# Patient Record
Sex: Male | Born: 1935 | Race: White | Hispanic: No | Marital: Married | Smoking: Former smoker
Health system: Southern US, Community
[De-identification: ages and names within clinical notes are randomized; demographics above are authoritative.]

## PROBLEM LIST (undated history)

## (undated) DIAGNOSIS — Z860100 Personal history of colon polyps, unspecified: Secondary | ICD-10-CM

## (undated) DIAGNOSIS — I2510 Atherosclerotic heart disease of native coronary artery without angina pectoris: Secondary | ICD-10-CM

## (undated) DIAGNOSIS — F32A Depression, unspecified: Secondary | ICD-10-CM

## (undated) DIAGNOSIS — Z7901 Long term (current) use of anticoagulants: Secondary | ICD-10-CM

## (undated) DIAGNOSIS — M1990 Unspecified osteoarthritis, unspecified site: Secondary | ICD-10-CM

## (undated) DIAGNOSIS — IMO0001 Reserved for inherently not codable concepts without codable children: Secondary | ICD-10-CM

## (undated) DIAGNOSIS — I509 Heart failure, unspecified: Secondary | ICD-10-CM

## (undated) DIAGNOSIS — E785 Hyperlipidemia, unspecified: Secondary | ICD-10-CM

## (undated) DIAGNOSIS — N200 Calculus of kidney: Secondary | ICD-10-CM

## (undated) DIAGNOSIS — F329 Major depressive disorder, single episode, unspecified: Secondary | ICD-10-CM

## (undated) DIAGNOSIS — F0390 Unspecified dementia without behavioral disturbance: Secondary | ICD-10-CM

## (undated) DIAGNOSIS — I219 Acute myocardial infarction, unspecified: Secondary | ICD-10-CM

## (undated) DIAGNOSIS — I10 Essential (primary) hypertension: Secondary | ICD-10-CM

## (undated) DIAGNOSIS — I259 Chronic ischemic heart disease, unspecified: Secondary | ICD-10-CM

## (undated) DIAGNOSIS — Z87442 Personal history of urinary calculi: Secondary | ICD-10-CM

## (undated) DIAGNOSIS — I480 Paroxysmal atrial fibrillation: Secondary | ICD-10-CM

## (undated) DIAGNOSIS — I499 Cardiac arrhythmia, unspecified: Secondary | ICD-10-CM

## (undated) DIAGNOSIS — K219 Gastro-esophageal reflux disease without esophagitis: Secondary | ICD-10-CM

## (undated) DIAGNOSIS — C189 Malignant neoplasm of colon, unspecified: Secondary | ICD-10-CM

## (undated) HISTORY — DX: Personal history of colonic polyps: Z86.010

## (undated) HISTORY — PX: COLONOSCOPY: SHX174

## (undated) HISTORY — DX: Hyperlipidemia, unspecified: E78.5

## (undated) HISTORY — DX: Malignant neoplasm of colon, unspecified: C18.9

## (undated) HISTORY — DX: Unspecified osteoarthritis, unspecified site: M19.90

## (undated) HISTORY — DX: Paroxysmal atrial fibrillation: I48.0

## (undated) HISTORY — PX: EYE SURGERY: SHX253

## (undated) HISTORY — DX: Long term (current) use of anticoagulants: Z79.01

## (undated) HISTORY — DX: Calculus of kidney: N20.0

## (undated) HISTORY — DX: Chronic ischemic heart disease, unspecified: I25.9

## (undated) HISTORY — DX: Personal history of urinary calculi: Z87.442

## (undated) HISTORY — PX: COLON SURGERY: SHX602

## (undated) HISTORY — DX: Reserved for inherently not codable concepts without codable children: IMO0001

## (undated) HISTORY — DX: Essential (primary) hypertension: I10

## (undated) HISTORY — DX: Personal history of colon polyps, unspecified: Z86.0100

## (undated) HISTORY — PX: CORONARY STENT PLACEMENT: SHX1402

## (undated) HISTORY — DX: Gastro-esophageal reflux disease without esophagitis: K21.9

---

## 1988-10-20 DIAGNOSIS — C189 Malignant neoplasm of colon, unspecified: Secondary | ICD-10-CM

## 1988-10-20 HISTORY — DX: Malignant neoplasm of colon, unspecified: C18.9

## 1998-03-09 ENCOUNTER — Other Ambulatory Visit: Admission: RE | Admit: 1998-03-09 | Discharge: 1998-03-09 | Payer: Self-pay | Admitting: Cardiology

## 1999-10-04 ENCOUNTER — Encounter (INDEPENDENT_AMBULATORY_CARE_PROVIDER_SITE_OTHER): Payer: Self-pay | Admitting: Specialist

## 1999-10-04 ENCOUNTER — Other Ambulatory Visit: Admission: RE | Admit: 1999-10-04 | Discharge: 1999-10-04 | Payer: Self-pay | Admitting: Internal Medicine

## 2001-11-21 ENCOUNTER — Inpatient Hospital Stay (HOSPITAL_COMMUNITY): Admission: RE | Admit: 2001-11-21 | Discharge: 2001-11-27 | Payer: Self-pay | Admitting: Surgery

## 2001-11-21 ENCOUNTER — Encounter: Payer: Self-pay | Admitting: Surgery

## 2001-11-21 ENCOUNTER — Encounter (INDEPENDENT_AMBULATORY_CARE_PROVIDER_SITE_OTHER): Payer: Self-pay | Admitting: *Deleted

## 2001-11-21 ENCOUNTER — Encounter: Admission: RE | Admit: 2001-11-21 | Discharge: 2001-11-27 | Payer: Self-pay | Admitting: Surgery

## 2001-12-23 ENCOUNTER — Encounter: Payer: Self-pay | Admitting: Surgery

## 2001-12-23 ENCOUNTER — Ambulatory Visit (HOSPITAL_BASED_OUTPATIENT_CLINIC_OR_DEPARTMENT_OTHER): Admission: RE | Admit: 2001-12-23 | Discharge: 2001-12-23 | Payer: Self-pay | Admitting: Surgery

## 2002-04-06 ENCOUNTER — Inpatient Hospital Stay (HOSPITAL_COMMUNITY): Admission: EM | Admit: 2002-04-06 | Discharge: 2002-04-08 | Payer: Self-pay | Admitting: Emergency Medicine

## 2002-07-08 ENCOUNTER — Encounter: Admission: RE | Admit: 2002-07-08 | Discharge: 2002-07-08 | Payer: Self-pay | Admitting: Surgery

## 2002-07-08 ENCOUNTER — Encounter: Payer: Self-pay | Admitting: Surgery

## 2002-07-12 ENCOUNTER — Ambulatory Visit (HOSPITAL_BASED_OUTPATIENT_CLINIC_OR_DEPARTMENT_OTHER): Admission: RE | Admit: 2002-07-12 | Discharge: 2002-07-12 | Payer: Self-pay | Admitting: Surgery

## 2002-11-08 ENCOUNTER — Ambulatory Visit (HOSPITAL_COMMUNITY): Admission: RE | Admit: 2002-11-08 | Discharge: 2002-11-08 | Payer: Self-pay | Admitting: Oncology

## 2002-11-08 ENCOUNTER — Encounter: Payer: Self-pay | Admitting: Oncology

## 2003-02-13 ENCOUNTER — Emergency Department (HOSPITAL_COMMUNITY): Admission: EM | Admit: 2003-02-13 | Discharge: 2003-02-13 | Payer: Self-pay | Admitting: Emergency Medicine

## 2004-12-19 ENCOUNTER — Ambulatory Visit: Payer: Self-pay | Admitting: Oncology

## 2005-06-03 ENCOUNTER — Ambulatory Visit: Payer: Self-pay | Admitting: Internal Medicine

## 2005-06-25 ENCOUNTER — Ambulatory Visit: Payer: Self-pay | Admitting: Oncology

## 2005-09-04 ENCOUNTER — Ambulatory Visit: Payer: Self-pay | Admitting: Internal Medicine

## 2005-12-11 ENCOUNTER — Ambulatory Visit: Payer: Self-pay | Admitting: Internal Medicine

## 2005-12-19 ENCOUNTER — Ambulatory Visit: Payer: Self-pay | Admitting: Oncology

## 2006-01-30 ENCOUNTER — Ambulatory Visit: Payer: Self-pay | Admitting: Internal Medicine

## 2006-02-11 ENCOUNTER — Ambulatory Visit: Payer: Self-pay | Admitting: Internal Medicine

## 2006-06-11 ENCOUNTER — Ambulatory Visit: Payer: Self-pay | Admitting: Internal Medicine

## 2006-06-18 ENCOUNTER — Ambulatory Visit: Payer: Self-pay | Admitting: Oncology

## 2006-06-23 LAB — COMPREHENSIVE METABOLIC PANEL
AST: 20 U/L (ref 0–37)
BUN: 26 mg/dL — ABNORMAL HIGH (ref 6–23)
Calcium: 9.7 mg/dL (ref 8.4–10.5)
Chloride: 103 mEq/L (ref 96–112)
Creatinine, Ser: 1.05 mg/dL (ref 0.40–1.50)
Total Bilirubin: 1.1 mg/dL (ref 0.3–1.2)

## 2006-06-23 LAB — CBC WITH DIFFERENTIAL/PLATELET
BASO%: 0.4 % (ref 0.0–2.0)
Basophils Absolute: 0 10*3/uL (ref 0.0–0.1)
EOS%: 3.7 % (ref 0.0–7.0)
HCT: 43.7 % (ref 38.7–49.9)
HGB: 15.2 g/dL (ref 13.0–17.1)
LYMPH%: 17.6 % (ref 14.0–48.0)
MCH: 35 pg — ABNORMAL HIGH (ref 28.0–33.4)
MCHC: 34.9 g/dL (ref 32.0–35.9)
MCV: 100.3 fL — ABNORMAL HIGH (ref 81.6–98.0)
NEUT%: 66.1 % (ref 40.0–75.0)
Platelets: 176 10*3/uL (ref 145–400)
lymph#: 1.5 10*3/uL (ref 0.9–3.3)

## 2006-06-23 LAB — PSA: PSA: 0.91 ng/mL (ref 0.10–4.00)

## 2006-06-23 LAB — CEA: CEA: 1.6 ng/mL (ref 0.0–5.0)

## 2006-06-29 ENCOUNTER — Ambulatory Visit: Payer: Self-pay

## 2006-08-14 ENCOUNTER — Ambulatory Visit: Payer: Self-pay | Admitting: Internal Medicine

## 2006-12-09 ENCOUNTER — Ambulatory Visit: Payer: Self-pay | Admitting: Internal Medicine

## 2006-12-25 ENCOUNTER — Ambulatory Visit: Payer: Self-pay | Admitting: Oncology

## 2006-12-25 ENCOUNTER — Ambulatory Visit: Payer: Self-pay | Admitting: Internal Medicine

## 2006-12-31 LAB — CBC WITH DIFFERENTIAL/PLATELET
BASO%: 0.4 % (ref 0.0–2.0)
Basophils Absolute: 0 10*3/uL (ref 0.0–0.1)
EOS%: 3 % (ref 0.0–7.0)
HCT: 44 % (ref 38.7–49.9)
HGB: 15.6 g/dL (ref 13.0–17.1)
LYMPH%: 16.5 % (ref 14.0–48.0)
MCH: 35.3 pg — ABNORMAL HIGH (ref 28.0–33.4)
MCHC: 35.4 g/dL (ref 32.0–35.9)
MCV: 99.7 fL — ABNORMAL HIGH (ref 81.6–98.0)
MONO%: 12.1 % (ref 0.0–13.0)
NEUT%: 68 % (ref 40.0–75.0)
Platelets: 160 10*3/uL (ref 145–400)
lymph#: 1.1 10*3/uL (ref 0.9–3.3)

## 2006-12-31 LAB — COMPREHENSIVE METABOLIC PANEL
ALT: 24 U/L (ref 0–53)
AST: 22 U/L (ref 0–37)
Alkaline Phosphatase: 57 U/L (ref 39–117)
BUN: 23 mg/dL (ref 6–23)
Calcium: 9.6 mg/dL (ref 8.4–10.5)
Chloride: 104 mEq/L (ref 96–112)
Creatinine, Ser: 1.03 mg/dL (ref 0.40–1.50)
Total Bilirubin: 1.3 mg/dL — ABNORMAL HIGH (ref 0.3–1.2)

## 2006-12-31 LAB — PSA: PSA: 0.94 ng/mL (ref 0.10–4.00)

## 2007-05-27 ENCOUNTER — Ambulatory Visit: Payer: Self-pay | Admitting: Internal Medicine

## 2007-12-06 ENCOUNTER — Ambulatory Visit: Payer: Self-pay | Admitting: Internal Medicine

## 2007-12-06 DIAGNOSIS — L719 Rosacea, unspecified: Secondary | ICD-10-CM | POA: Insufficient documentation

## 2007-12-06 DIAGNOSIS — Z860100 Personal history of colon polyps, unspecified: Secondary | ICD-10-CM | POA: Insufficient documentation

## 2007-12-06 DIAGNOSIS — E785 Hyperlipidemia, unspecified: Secondary | ICD-10-CM | POA: Insufficient documentation

## 2007-12-06 DIAGNOSIS — I2510 Atherosclerotic heart disease of native coronary artery without angina pectoris: Secondary | ICD-10-CM | POA: Insufficient documentation

## 2007-12-06 DIAGNOSIS — I10 Essential (primary) hypertension: Secondary | ICD-10-CM | POA: Insufficient documentation

## 2007-12-06 DIAGNOSIS — Z85038 Personal history of other malignant neoplasm of large intestine: Secondary | ICD-10-CM | POA: Insufficient documentation

## 2007-12-06 DIAGNOSIS — M1990 Unspecified osteoarthritis, unspecified site: Secondary | ICD-10-CM | POA: Insufficient documentation

## 2007-12-31 ENCOUNTER — Ambulatory Visit: Payer: Self-pay | Admitting: Oncology

## 2008-01-04 LAB — COMPREHENSIVE METABOLIC PANEL
ALT: 20 U/L (ref 0–53)
AST: 20 U/L (ref 0–37)
Alkaline Phosphatase: 69 U/L (ref 39–117)
CO2: 26 mEq/L (ref 19–32)
Creatinine, Ser: 0.98 mg/dL (ref 0.40–1.50)
Sodium: 137 mEq/L (ref 135–145)
Total Bilirubin: 1.1 mg/dL (ref 0.3–1.2)
Total Protein: 6.9 g/dL (ref 6.0–8.3)

## 2008-01-04 LAB — CBC WITH DIFFERENTIAL/PLATELET
Basophils Absolute: 0 10*3/uL (ref 0.0–0.1)
EOS%: 3.8 % (ref 0.0–7.0)
Eosinophils Absolute: 0.3 10*3/uL (ref 0.0–0.5)
HGB: 15 g/dL (ref 13.0–17.1)
LYMPH%: 18.5 % (ref 14.0–48.0)
MCH: 35.3 pg — ABNORMAL HIGH (ref 28.0–33.4)
MCV: 100.2 fL — ABNORMAL HIGH (ref 81.6–98.0)
MONO%: 12.1 % (ref 0.0–13.0)
NEUT%: 65.3 % (ref 40.0–75.0)
Platelets: 183 10*3/uL (ref 145–400)
RDW: 13 % (ref 11.2–14.6)

## 2008-01-04 LAB — LACTATE DEHYDROGENASE: LDH: 161 U/L (ref 94–250)

## 2008-07-07 ENCOUNTER — Ambulatory Visit: Payer: Self-pay | Admitting: Internal Medicine

## 2008-08-15 ENCOUNTER — Encounter: Payer: Self-pay | Admitting: Internal Medicine

## 2008-11-13 ENCOUNTER — Encounter: Payer: Self-pay | Admitting: Internal Medicine

## 2009-01-02 ENCOUNTER — Encounter (INDEPENDENT_AMBULATORY_CARE_PROVIDER_SITE_OTHER): Payer: Self-pay | Admitting: *Deleted

## 2009-01-03 ENCOUNTER — Ambulatory Visit: Payer: Self-pay | Admitting: Oncology

## 2009-01-05 LAB — CBC WITH DIFFERENTIAL/PLATELET
Basophils Absolute: 0.1 10*3/uL (ref 0.0–0.1)
Eosinophils Absolute: 0.4 10*3/uL (ref 0.0–0.5)
HCT: 44.7 % (ref 38.4–49.9)
HGB: 15.8 g/dL (ref 13.0–17.1)
LYMPH%: 23.9 % (ref 14.0–49.0)
MCH: 34.1 pg — ABNORMAL HIGH (ref 27.2–33.4)
MCV: 96.5 fL (ref 79.3–98.0)
MONO%: 11.9 % (ref 0.0–14.0)
NEUT#: 4 10*3/uL (ref 1.5–6.5)
NEUT%: 57.8 % (ref 39.0–75.0)
Platelets: 132 10*3/uL — ABNORMAL LOW (ref 140–400)
RDW: 12.6 % (ref 11.0–14.6)

## 2009-01-05 LAB — COMPREHENSIVE METABOLIC PANEL
Albumin: 4.5 g/dL (ref 3.5–5.2)
Alkaline Phosphatase: 65 U/L (ref 39–117)
BUN: 23 mg/dL (ref 6–23)
Creatinine, Ser: 1.07 mg/dL (ref 0.40–1.50)
Glucose, Bld: 87 mg/dL (ref 70–99)
Potassium: 4.1 mEq/L (ref 3.5–5.3)

## 2009-01-05 LAB — CEA: CEA: 1 ng/mL (ref 0.0–5.0)

## 2009-01-12 LAB — CBC WITH DIFFERENTIAL/PLATELET
BASO%: 0.1 % (ref 0.0–2.0)
EOS%: 3.8 % (ref 0.0–7.0)
MCH: 35 pg — ABNORMAL HIGH (ref 27.2–33.4)
MCHC: 34.5 g/dL (ref 32.0–36.0)
MCV: 101.5 fL — ABNORMAL HIGH (ref 79.3–98.0)
MONO%: 10.6 % (ref 0.0–14.0)
NEUT%: 65.7 % (ref 39.0–75.0)
RDW: 12.5 % (ref 11.0–14.6)
lymph#: 1.5 10*3/uL (ref 0.9–3.3)

## 2009-01-12 LAB — MORPHOLOGY: PLT EST: ADEQUATE

## 2009-02-05 ENCOUNTER — Ambulatory Visit: Payer: Self-pay | Admitting: Internal Medicine

## 2009-02-27 ENCOUNTER — Ambulatory Visit: Payer: Self-pay | Admitting: Internal Medicine

## 2009-03-05 ENCOUNTER — Telehealth: Payer: Self-pay | Admitting: Internal Medicine

## 2009-03-13 ENCOUNTER — Ambulatory Visit: Payer: Self-pay | Admitting: Internal Medicine

## 2009-03-13 ENCOUNTER — Encounter: Payer: Self-pay | Admitting: Internal Medicine

## 2009-03-14 ENCOUNTER — Encounter: Payer: Self-pay | Admitting: Internal Medicine

## 2009-07-02 ENCOUNTER — Encounter: Payer: Self-pay | Admitting: Internal Medicine

## 2009-07-31 ENCOUNTER — Ambulatory Visit: Payer: Self-pay | Admitting: Internal Medicine

## 2009-08-01 LAB — CONVERTED CEMR LAB
ALT: 27 units/L (ref 0–53)
AST: 26 units/L (ref 0–37)
Albumin: 3.8 g/dL (ref 3.5–5.2)
Alkaline Phosphatase: 44 units/L (ref 39–117)
BUN: 20 mg/dL (ref 6–23)
Basophils Absolute: 0 10*3/uL (ref 0.0–0.1)
Basophils Relative: 0.5 % (ref 0.0–3.0)
Bilirubin, Direct: 0.2 mg/dL (ref 0.0–0.3)
CO2: 28 meq/L (ref 19–32)
Calcium: 9.7 mg/dL (ref 8.4–10.5)
Chloride: 106 meq/L (ref 96–112)
Cholesterol: 128 mg/dL (ref 0–200)
Creatinine, Ser: 1.1 mg/dL (ref 0.4–1.5)
Eosinophils Absolute: 0.3 10*3/uL (ref 0.0–0.7)
Eosinophils Relative: 4.6 % (ref 0.0–5.0)
GFR calc non Af Amer: 68.83 mL/min (ref >60)
Glucose, Bld: 98 mg/dL (ref 70–99)
HCT: 43.3 % (ref 39.0–52.0)
HDL: 39.8 mg/dL (ref >39.00)
Hemoglobin: 14.7 g/dL (ref 13.0–17.0)
LDL Cholesterol: 74 mg/dL (ref 0–99)
Lymphocytes Relative: 19.1 % (ref 12.0–46.0)
Lymphs Abs: 1.4 10*3/uL (ref 0.7–4.0)
MCHC: 34.1 g/dL (ref 30.0–36.0)
MCV: 104.6 fL — ABNORMAL HIGH (ref 78.0–100.0)
Monocytes Absolute: 0.8 10*3/uL (ref 0.1–1.0)
Monocytes Relative: 11.9 % (ref 3.0–12.0)
Neutro Abs: 4.6 10*3/uL (ref 1.4–7.7)
Neutrophils Relative %: 63.9 % (ref 43.0–77.0)
PSA: 0.86 ng/mL (ref 0.10–4.00)
Platelets: 137 10*3/uL — ABNORMAL LOW (ref 150.0–400.0)
Potassium: 4.6 meq/L (ref 3.5–5.1)
RBC: 4.14 M/uL — ABNORMAL LOW (ref 4.22–5.81)
RDW: 12.1 % (ref 11.5–14.6)
Sodium: 143 meq/L (ref 135–145)
TSH: 0.9 microintl units/mL (ref 0.35–5.50)
Total Bilirubin: 1.1 mg/dL (ref 0.3–1.2)
Total CHOL/HDL Ratio: 3
Total Protein: 6.8 g/dL (ref 6.0–8.3)
Triglycerides: 71 mg/dL (ref 0.0–149.0)
VLDL: 14.2 mg/dL (ref 0.0–40.0)
WBC: 7.1 10*3/uL (ref 4.5–10.5)

## 2009-08-07 ENCOUNTER — Ambulatory Visit: Payer: Self-pay | Admitting: Internal Medicine

## 2009-08-07 DIAGNOSIS — R799 Abnormal finding of blood chemistry, unspecified: Secondary | ICD-10-CM | POA: Insufficient documentation

## 2009-08-07 DIAGNOSIS — Z87891 Personal history of nicotine dependence: Secondary | ICD-10-CM | POA: Insufficient documentation

## 2009-08-07 LAB — CONVERTED CEMR LAB
Albumin ELP: 59.8 % (ref 55.8–66.1)
Alpha-1-Globulin: 4.2 % (ref 2.9–4.9)
Alpha-2-Globulin: 9.6 % (ref 7.1–11.8)
Beta Globulin: 5.5 % (ref 4.7–7.2)
Gamma Globulin: 16.5 % (ref 11.1–18.8)
Total Protein, Serum Electrophoresis: 7.1 g/dL (ref 6.0–8.3)

## 2009-08-09 LAB — CONVERTED CEMR LAB
Folate: >20 ng/mL
Sed Rate: 7 mm/hr (ref 0–22)
Vitamin B-12: 857 pg/mL (ref 211–911)

## 2009-12-17 ENCOUNTER — Telehealth: Payer: Self-pay | Admitting: Internal Medicine

## 2010-01-02 ENCOUNTER — Ambulatory Visit: Payer: Self-pay | Admitting: Oncology

## 2010-01-04 ENCOUNTER — Encounter: Payer: Self-pay | Admitting: Internal Medicine

## 2010-01-04 LAB — CBC WITH DIFFERENTIAL/PLATELET
BASO%: 0.2 % (ref 0.0–2.0)
EOS%: 3.5 % (ref 0.0–7.0)
Eosinophils Absolute: 0.3 10*3/uL (ref 0.0–0.5)
LYMPH%: 19.3 % (ref 14.0–49.0)
MCHC: 35.2 g/dL (ref 32.0–36.0)
MCV: 101.9 fL — ABNORMAL HIGH (ref 79.3–98.0)
MONO%: 12.8 % (ref 0.0–14.0)
NEUT#: 4.7 10*3/uL (ref 1.5–6.5)
Platelets: 132 10*3/uL — ABNORMAL LOW (ref 140–400)
RBC: 4.03 10*6/uL — ABNORMAL LOW (ref 4.20–5.82)
RDW: 13 % (ref 11.0–14.6)

## 2010-01-04 LAB — COMPREHENSIVE METABOLIC PANEL
ALT: 29 U/L (ref 0–53)
AST: 25 U/L (ref 0–37)
Albumin: 4.1 g/dL (ref 3.5–5.2)
Alkaline Phosphatase: 53 U/L (ref 39–117)
Glucose, Bld: 84 mg/dL (ref 70–99)
Potassium: 4.4 mEq/L (ref 3.5–5.3)
Sodium: 139 mEq/L (ref 135–145)
Total Bilirubin: 1.1 mg/dL (ref 0.3–1.2)
Total Protein: 6.7 g/dL (ref 6.0–8.3)

## 2010-01-04 LAB — LACTATE DEHYDROGENASE: LDH: 153 U/L (ref 94–250)

## 2010-01-11 ENCOUNTER — Encounter: Payer: Self-pay | Admitting: Internal Medicine

## 2010-05-27 ENCOUNTER — Ambulatory Visit: Payer: Self-pay | Admitting: Internal Medicine

## 2010-05-27 ENCOUNTER — Ambulatory Visit: Payer: Self-pay | Admitting: Cardiology

## 2010-05-30 ENCOUNTER — Encounter: Payer: Self-pay | Admitting: Internal Medicine

## 2010-11-19 NOTE — Assessment & Plan Note (Signed)
Summary: FU Samuel Santiago  #   Vital Signs:  Patient profile:   77 year old male Height:      66 inches Weight:      177 pounds BMI:     28.67 O2 Sat:      96 % on Room air Temp:     98.0 degrees F oral Pulse rate:   76 / minute Pulse rhythm:   regular Resp:     16 per minute BP sitting:   120 / 70  (left arm) Cuff size:   regular  Vitals Entered By: Lanier Prude, CMA(AAMA) (May 27, 2010 2:13 PM)  O2 Flow:  Room air CC: f/u Comments pt needsRF on Metronidazole 0.75% cream and Erythromycin 5mg /GM ointm   Primary Care Kavonte Bearse:  Tresa Garter MD  CC:  f/u.  History of Present Illness: The patient presents for a follow up of hypertension, OA, rosacea, hyperlipidemia. He had labs today w/Dr Deborah Chalk   Current Medications (verified): 1)  Crestor 10 Mg Tabs (Rosuvastatin Calcium) .Marland Kitchen.. 1 Tablet By Mouth Daily 2)  Benazepril Hcl 20 Mg  Tabs (Benazepril Hcl) .Marland Kitchen.. 1 By Mouth Daily 3)  Omeprazole 20 Mg Cpdr (Omeprazole) .... Take 1 Tabs Daily Prn 4)  Multi-Day   Tabs (Multiple Vitamin) .Marland Kitchen.. 1 Po Qd 5)  Aspirin 325 Mg Tabs (Aspirin) .... Once Daily 6)  Vitamin D3 1000 Unit  Tabs (Cholecalciferol) .Marland Kitchen.. 1 Qd 7)  Metronidazole 0.75 %  Crea (Metronidazole) .... Use Two Times A Day On Face  Allergies (verified): No Known Drug Allergies  Past History:  Past Medical History: Last updated: 08/07/2009 Coronary artery disease Dr Deborah Chalk Hyperlipidemia Hypertension Osteoarthritis Rosecea Colon cancer, hx of  Dr Cyndie Chime  Dr Francene Boyers  Past Surgical History: Last updated: 08/07/2009 Colectomy  Family History: Last updated: 12/06/2007 Family History Hypertension  Social History: Last updated: 12/06/2007 Retired Married Former Smoker  Review of Systems  The patient denies fever, chest pain, syncope, and dyspnea on exertion.    Physical Exam  General:  pleasent and cooperative, in no distress Eyes:  Lower eylids inflamed B Ears:  External ear exam shows  no significant lesions or deformities.  Otoscopic examination reveals clear canals, tympanic membranes are intact bilaterally without bulging, retraction, inflammation or discharge. Hearing is grossly normal bilaterally. Mouth:  Oral mucosa and oropharynx without lesions or exudates.  Teeth in good repair. Neck:  supple, no lymphodenopathy, no thyromegaly, no masses Lungs:  clear bilaterally, no wheezes, rhonchi or crackles Heart:  RRR, no murmurs, rubs or gallops Abdomen:  soft and non-tender with normal BS, no organomegalies, no masses Colostomy preseent Msk:  No deformity or scoliosis noted of thoracic or lumbar spine.   Extremities:  no edema Neurologic:  A little ataxic Skin:  Intact without suspicious lesions or rashes Psych:  Cognition and judgment appear intact. Alert and cooperative with normal attention span and concentration. No apparent delusions, illusions, hallucinations   Impression & Recommendations:  Problem # 1:  ROSACEA (ICD-695.3) Assessment Unchanged On the regimen of medicine(s) reflected in the chart    Problem # 2:  HYPERLIPIDEMIA (ICD-272.4) Assessment: Unchanged  His updated medication list for this problem includes:    Crestor 10 Mg Tabs (Rosuvastatin calcium) .Marland Kitchen... 1 tablet by mouth daily  Problem # 3:  HYPERTENSION (ICD-401.9) Assessment: Unchanged  His updated medication list for this problem includes:    Benazepril Hcl 20 Mg Tabs (Benazepril hcl) .Marland Kitchen... 1 by mouth daily  Problem # 4:  CORONARY  ARTERY DISEASE (ICD-414.00) Assessment: Unchanged  His updated medication list for this problem includes:    Benazepril Hcl 20 Mg Tabs (Benazepril hcl) .Marland Kitchen... 1 by mouth daily    Aspirin 325 Mg Tabs (Aspirin) ..... Once daily  Complete Medication List: 1)  Crestor 10 Mg Tabs (Rosuvastatin calcium) .Marland Kitchen.. 1 tablet by mouth daily 2)  Benazepril Hcl 20 Mg Tabs (Benazepril hcl) .Marland Kitchen.. 1 by mouth daily 3)  Omeprazole 20 Mg Cpdr (Omeprazole) .... Take 1 tabs daily  prn 4)  Multi-day Tabs (Multiple vitamin) .Marland Kitchen.. 1 po qd 5)  Aspirin 325 Mg Tabs (Aspirin) .... Once daily 6)  Vitamin D3 1000 Unit Tabs (Cholecalciferol) .Marland Kitchen.. 1 qd 7)  Metronidazole 0.75 % Crea (Metronidazole) .... Use two times a day on face 8)  Erythromycin 5 Mg/gm Oint (Erythromycin) .... In affected eye(s)  two times a day  Patient Instructions: 1)  Please schedule a follow-up appointment in 6 months well w/labs. 2)  Use balance  exercises that I have provided (5 min. every day)  Prescriptions: METRONIDAZOLE 0.75 %  CREA (METRONIDAZOLE) use two times a day on face  #45 g x 3   Entered and Authorized by:   Tresa Garter MD   Signed by:   Tresa Garter MD on 05/27/2010   Method used:   Electronically to        [facility]* (retail)       [address] [place], [place]  [address]       Ph: [phone] or [phone]       Fax: [phone]   RxID:   [id number] ERYTHROMYCIN 5 MG/GM OINT (ERYTHROMYCIN) in affected eye(s)  two times a day  #15 g x 4   Entered and Authorized by:   Tresa Garter MD   Signed by:   Tresa Garter MD on 05/27/2010   Method used:   Electronically to        [facility]* (retail)       [address] [place], [place]  [address]       Ph: [phone] or [phone]       Fax: [phone]   RxID:   [id number]

## 2010-11-19 NOTE — Progress Notes (Signed)
Summary: Omeprazole refill  Phone Note Refill Request Message from:  Fax from Pharmacy on December 17, 2009 11:39 AM  Refills Requested: Medication #1:  OMEPRAZOLE 20 MG CPDR Take 1 tabs daily prn Initial call taken by: Lucious Groves,  December 17, 2009 11:39 AM    Prescriptions: OMEPRAZOLE 20 MG CPDR (OMEPRAZOLE) Take 1 tabs daily prn  #30 x 11   Entered by:   Lucious Groves   Authorized by:   Tresa Garter MD   Signed by:   Lucious Groves on 12/17/2009   Method used:   Electronically to        [facility]* (retail)       [address] [place], [place]  [address]       Ph: [phone] or [phone]       Fax: [phone]   RxID:   [id number]

## 2010-11-19 NOTE — Letter (Signed)
[facility]   Imported By: Lennie Odor 01/29/2010 14:37:25  _____________________________________________________________________  External Attachment:    Type:   Image     Comment:   External Document

## 2010-11-20 DIAGNOSIS — IMO0001 Reserved for inherently not codable concepts without codable children: Secondary | ICD-10-CM

## 2010-11-20 HISTORY — DX: Reserved for inherently not codable concepts without codable children: IMO0001

## 2010-11-26 ENCOUNTER — Other Ambulatory Visit: Payer: Self-pay

## 2010-11-28 ENCOUNTER — Encounter (INDEPENDENT_AMBULATORY_CARE_PROVIDER_SITE_OTHER): Payer: Self-pay | Admitting: *Deleted

## 2010-11-28 ENCOUNTER — Other Ambulatory Visit: Payer: MEDICARE

## 2010-11-28 ENCOUNTER — Other Ambulatory Visit: Payer: Self-pay | Admitting: Internal Medicine

## 2010-11-28 DIAGNOSIS — I10 Essential (primary) hypertension: Secondary | ICD-10-CM

## 2010-11-28 DIAGNOSIS — E785 Hyperlipidemia, unspecified: Secondary | ICD-10-CM

## 2010-11-28 DIAGNOSIS — Z125 Encounter for screening for malignant neoplasm of prostate: Secondary | ICD-10-CM

## 2010-11-28 DIAGNOSIS — Z0000 Encounter for general adult medical examination without abnormal findings: Secondary | ICD-10-CM

## 2010-11-28 LAB — BASIC METABOLIC PANEL
BUN: 22 mg/dL (ref 6–23)
Calcium: 9.2 mg/dL (ref 8.4–10.5)
Creatinine, Ser: 1 mg/dL (ref 0.4–1.5)
GFR: 74 mL/min (ref >60.00)
Glucose, Bld: 83 mg/dL (ref 70–99)
Sodium: 136 mEq/L (ref 135–145)

## 2010-11-28 LAB — LIPID PANEL
Cholesterol: 135 mg/dL (ref 0–200)
HDL: 36.9 mg/dL — ABNORMAL LOW (ref >39.00)
Triglycerides: 79 mg/dL (ref 0.0–149.0)
VLDL: 15.8 mg/dL (ref 0.0–40.0)

## 2010-11-28 LAB — HEPATIC FUNCTION PANEL
AST: 25 U/L (ref 0–37)
Alkaline Phosphatase: 59 U/L (ref 39–117)
Total Bilirubin: 1 mg/dL (ref 0.3–1.2)

## 2010-11-28 LAB — URINALYSIS, ROUTINE W REFLEX MICROSCOPIC
Nitrite: NEGATIVE
Specific Gravity, Urine: 1.02 (ref 1.000–1.030)
Total Protein, Urine: NEGATIVE
Urine Glucose: NEGATIVE
Urobilinogen, UA: 0.2 (ref 0.0–1.0)

## 2010-11-28 LAB — CBC WITH DIFFERENTIAL/PLATELET
Basophils Absolute: 0 10*3/uL (ref 0.0–0.1)
Eosinophils Absolute: 0.3 10*3/uL (ref 0.0–0.7)
Lymphocytes Relative: 12.5 % (ref 12.0–46.0)
Monocytes Relative: 10.3 % (ref 3.0–12.0)
Neutrophils Relative %: 73.5 % (ref 43.0–77.0)
Platelets: 192 10*3/uL (ref 150.0–400.0)
RDW: 12.6 % (ref 11.5–14.6)

## 2010-11-28 LAB — TSH: TSH: 0.74 u[IU]/mL (ref 0.35–5.50)

## 2010-12-02 ENCOUNTER — Ambulatory Visit (INDEPENDENT_AMBULATORY_CARE_PROVIDER_SITE_OTHER): Payer: MEDICARE | Admitting: Internal Medicine

## 2010-12-02 ENCOUNTER — Encounter: Payer: Self-pay | Admitting: Internal Medicine

## 2010-12-02 DIAGNOSIS — R799 Abnormal finding of blood chemistry, unspecified: Secondary | ICD-10-CM

## 2010-12-02 DIAGNOSIS — H01009 Unspecified blepharitis unspecified eye, unspecified eyelid: Secondary | ICD-10-CM | POA: Insufficient documentation

## 2010-12-02 DIAGNOSIS — N3090 Cystitis, unspecified without hematuria: Secondary | ICD-10-CM | POA: Insufficient documentation

## 2010-12-02 DIAGNOSIS — Z0000 Encounter for general adult medical examination without abnormal findings: Secondary | ICD-10-CM

## 2010-12-10 ENCOUNTER — Ambulatory Visit (INDEPENDENT_AMBULATORY_CARE_PROVIDER_SITE_OTHER): Payer: MEDICARE | Admitting: Cardiology

## 2010-12-10 DIAGNOSIS — I2510 Atherosclerotic heart disease of native coronary artery without angina pectoris: Secondary | ICD-10-CM

## 2010-12-10 DIAGNOSIS — I10 Essential (primary) hypertension: Secondary | ICD-10-CM

## 2010-12-10 DIAGNOSIS — E7800 Pure hypercholesterolemia, unspecified: Secondary | ICD-10-CM

## 2010-12-11 NOTE — Assessment & Plan Note (Signed)
Summary: 6 MOS F/U W/LABS (PT DOES NOT WANT WELL) CD   Vital Signs:  Patient profile:   77 year old male Height:      66 inches Weight:      177 pounds BMI:     28.67 O2 Sat:      94 % on Room air Temp:     97.8 degrees F oral Pulse rate:   66 / minute Pulse rhythm:   regular BP sitting:   146 / 82  (left arm) Cuff size:   large  Vitals Entered By: Rock Nephew CMA (December 02, 2010 9:07 AM)  O2 Flow:  Room air CC: follow-up visit Is Patient Diabetic? No Pain Assessment Patient in pain? no       Does patient need assistance? Functional Status Self care Ambulation Normal   Primary Care Provider:  Georgina Quint Stephana Morell MD  CC:  follow-up visit.  History of Present Illness: The patient presents for a follow up of hypertension, OA, GERD, hyperlipidemia  Patient past medical history, social history, and family history reviewed in detail no significant changes.  Patient is physically active. Depression is negative and mood is good. Hearing is normal, and able to perform activities of daily living. Risk of falling is negligible and home safety has been reviewed and is appropriate. Patient has normal height, he is overweight, and visual acuity is ok w/glasses. Patient has been counseled on age-appropriate routine health concerns for screening and prevention. Education, counseling done. Cognition is nl.  Preventive Screening-Counseling & Management  Alcohol-Tobacco     Alcohol drinks/day: 0     Smoking Status: quit > 6 months  Caffeine-Diet-Exercise     Caffeine Counseling: not indicated; caffeine use is not excessive or problematic     Diet Counseling: not indicated; diet is assessed to be healthy     Nutrition Referrals: no     Does Patient Exercise: yes     Type of exercise: walking     Exercise (avg: min/session): 30-60     Times/week: 7     Exercise Counseling: not indicated; exercise is adequate     Depression Counseling: not indicated; screening negative for  depression  Hep-HIV-STD-Contraception     Hepatitis Risk: no risk noted     Sun Exposure-Excessive: no  Safety-Violence-Falls     Seat Belt Use: yes     Violence in the Home: no risk noted     Sexual Abuse: no     Fall Risk Counseling: not indicated; no significant falls noted      Sexual History:  currently monogamous.    Allergies: No Known Drug Allergies  Past History:  Past Medical History: Last updated: 08/07/2009 Coronary artery disease Dr Deborah Chalk Hyperlipidemia Hypertension Osteoarthritis Rosecea Colon cancer, hx of  Dr Cyndie Chime  Dr Francene Boyers  Past Surgical History: Last updated: 08/07/2009 Colectomy  Family History: Last updated: 12/06/2007 Family History Hypertension  Social History: Last updated: 12/06/2007 Retired Married Former Smoker  Social History: Smoking Status:  quit > 6 months Does Patient Exercise:  yes Hepatitis Risk:  no risk noted Sun Exposure-Excessive:  no Seat Belt Use:  yes Sexual History:  currently monogamous  Review of Systems  The patient denies fever, dyspnea on exertion, abdominal pain, difficulty walking, depression, anorexia, weight loss, weight gain, vision loss, decreased hearing, hoarseness, chest pain, syncope, peripheral edema, prolonged cough, headaches, hemoptysis, melena, hematochezia, severe indigestion/heartburn, hematuria, incontinence, genital sores, muscle weakness, suspicious skin lesions, transient blindness, unusual weight change, abnormal bleeding, enlarged  lymph nodes, angioedema, and breast masses.         OA  Physical Exam  General:  pleasent and cooperative, in no distress Head:  Normocephalic and atraumatic without obvious abnormalities. No apparent alopecia or balding. Eyes:  Lower eylids inflamed B Ears:  External ear exam shows no significant lesions or deformities.  Otoscopic examination reveals clear canals, tympanic membranes are intact bilaterally without bulging, retraction,  inflammation or discharge. Hearing is grossly normal bilaterally. Mouth:  Oral mucosa and oropharynx without lesions or exudates.  Teeth in good repair. Neck:  supple, no lymphodenopathy, no thyromegaly, no masses Lungs:  clear bilaterally, no wheezes, rhonchi or crackles Heart:  RRR, no murmurs, rubs or gallops Abdomen:  soft and non-tender with normal BS, no organomegalies, no masses Colostomy preseent Msk:  No deformity or scoliosis noted of thoracic or lumbar spine.   Extremities:  No edema B Neurologic:  A little ataxic Skin:  Intact without suspicious lesions or rashes Psych:  Cognition and judgment appear intact. Alert and cooperative with normal attention span and concentration. No apparent delusions, illusions, hallucinations   Impression & Recommendations:  Problem # 1:  WELL ADULT EXAM (ICD-V70.0) Assessment New  Overall doing well, age appropriate education and counseling updated and referral for appropriate preventive services done unless declined, immunizations up to date or declined, diet counseling done if overweight, urged to quit smoking if smokes, most recent labs reviewed and current ordered if appropriate, ecg reviewed or declined (interpretation per ECG scanned in the EMR if done); information regarding Medicare Preventation requirements given if appropriate.  The labs were reviewed with the patient.   Orders: Medicare -1st Annual Wellness Visit [phone])  Problem # 2:  CYSTITIS (ICD-595.9) Assessment: New  His updated medication list for this problem includes:    Ciprofloxacin Hcl 250 Mg Tabs (Ciprofloxacin hcl) .Marland Kitchen... 1 by mouth two times a day for cystitis  Problem # 3:  CBC, ABNORMAL (ICD-790.99) elev MCV Assessment: Deteriorated Chronic - watching - repeat labs. See "Patient Instructions".   Problem # 4:  BLEPHARITIS (ICD-373.00) Assessment: Deteriorated On the regimen of medicine(s) reflected in the chart    Complete Medication List: 1)  Crestor 10 Mg  Tabs (Rosuvastatin calcium) .Marland Kitchen.. 1 tablet by mouth daily 2)  Benazepril Hcl 20 Mg Tabs (Benazepril hcl) .Marland Kitchen.. 1 by mouth daily 3)  Omeprazole 20 Mg Cpdr (Omeprazole) .... Take 1 tabs daily prn 4)  Multi-day Tabs (Multiple vitamin) .Marland Kitchen.. 1 po qd 5)  Aspirin 325 Mg Tabs (Aspirin) .... Once daily 6)  Vitamin D3 1000 Unit Tabs (Cholecalciferol) .Marland Kitchen.. 1 qd 7)  Metronidazole 0.75 % Crea (Metronidazole) .... Use two times a day on face 8)  Ciprofloxacin Hcl 250 Mg Tabs (Ciprofloxacin hcl) .Marland Kitchen.. 1 by mouth two times a day for cystitis 9)  Erythromycin 5 Mg/gm Oint (Erythromycin) .... In affected eye(s)  two times a day  Patient Instructions: 1)  Please schedule a follow-up appointment in 6 months. 2)  BMP prior to visit, ICD-9: 3)  Hepatic Panel prior to visit, ICD-9: 4)  Lipid Panel prior to visit, ICD-9: 5)  CBC w/ Diff prior to visit, ICD-9: 6)  Urine-dip prior to visit, ICD-9: 7)  Vit B12 8)  SPEP 790.99  595.0 Prescriptions: ERYTHROMYCIN 5 MG/GM OINT (ERYTHROMYCIN) in affected eye(s)  two times a day  #1 tube x 3   Entered and Authorized by:   Tresa Garter MD   Signed by:   Tresa Garter MD on 12/02/2010  Method used:   Electronically to        [facility]* (retail)       [address] [place], [place]  [address]       Ph: [phone] or [phone]       Fax: [phone]   RxID:   [id number] CIPROFLOXACIN HCL 250 MG TABS (CIPROFLOXACIN HCL) 1 by mouth two times a day for cystitis  #20 x 0   Entered and Authorized by:   Tresa Garter MD   Signed by:   Tresa Garter MD on 12/02/2010   Method used:   Electronically to        [facility]* (retail)       [address] [place], [place]  [address]       Ph: [phone] or [phone]       Fax: [phone]   RxID:   [id number]    Orders Added: 1)  Medicare -1st Annual  Wellness Visit [G0438] 2)  Est. Patient Level III [627[address]]   Immunization History:  Influenza Immunization History:    Influenza:  historical (07/24/2010)  Tetanus Vaccine (to be given today)   Immunization History:  Influenza Immunization History:    Influenza:  Historical (07/24/2010)  Tetanus Vaccine (to be given today)

## 2010-12-13 ENCOUNTER — Telehealth (INDEPENDENT_AMBULATORY_CARE_PROVIDER_SITE_OTHER): Payer: Self-pay | Admitting: *Deleted

## 2010-12-16 ENCOUNTER — Encounter: Payer: Self-pay | Admitting: Internal Medicine

## 2010-12-16 ENCOUNTER — Ambulatory Visit (HOSPITAL_COMMUNITY): Payer: Medicare Other | Attending: Cardiology

## 2010-12-16 DIAGNOSIS — R079 Chest pain, unspecified: Secondary | ICD-10-CM | POA: Insufficient documentation

## 2010-12-16 DIAGNOSIS — I2510 Atherosclerotic heart disease of native coronary artery without angina pectoris: Secondary | ICD-10-CM

## 2010-12-17 NOTE — Progress Notes (Signed)
Summary: Nuclear Pre-Procedure  Phone Note Outgoing Call Call back at [facility] Phone [phone]   Call placed by: Stanton Kidney, EMT-P,  December 13, 2010 1:17 PM Summary of Call: Unable to leave message with information on Myoview Information Sheet (see scanned document for details); no answer/no machine. Stanton Kidney, EMT-P  December 13, 2010 1:17 PM      Nuclear Med Background Indications for Stress Test: Evaluation for Ischemia, Stent Patency   History: Heart Catheterization, History of Chemo, Myocardial Infarction, Myocardial Perfusion Study, Stents  History Comments: '97 MI > RCA stent '03 MI > RCA re-do '10 MPS: EF=56%, Inf. fixed defect     Nuclear Pre-Procedure Cardiac Risk Factors: Family History - CAD, History of Smoking, Hypertension, Lipids Height (in): 66

## 2010-12-26 NOTE — Assessment & Plan Note (Signed)
Summary: LEXSCAN/ CHEST PAINS/INS:SECURE HOR/WT:176/ WJ:[id number] PER KE...  Nuclear Med Background Indications for Stress Test: Evaluation for Ischemia, Stent Patency   History: Heart Catheterization, History of Chemo, Myocardial Infarction, Myocardial Perfusion Study, Stents  History Comments: '97 MI > RCA stent '03 MI > RCA re-do '10 MPS: EF=56%, Inf. fixed defect  Symptoms: Palpitations    Nuclear Pre-Procedure Cardiac Risk Factors: Family History - CAD, History of Smoking, Hypertension, Lipids Caffeine/Decaff Intake: None NPO After: 6:00 AM Lungs: clear IV 0.9% NS with Angio Cath: 22g     IV Site: L Antecubital IV Started by: Irean Hong, RN Chest Size (in) 44     Height (in): 66 Weight (lb): 174 BMI: 28.19  Nuclear Med Study 1 or 2 day study:  1 day     Stress Test Type:  Treadmill/Lexiscan Reading MD:  Dietrich Pates, MD     Referring MD:  S.Tennant Resting Radionuclide:  Technetium 21m Tetrofosmin     Resting Radionuclide Dose:  11 mCi  Stress Radionuclide:  Technetium 78m Tetrofosmin     Stress Radionuclide Dose:  33 mCi   Stress Protocol  Max Systolic BP: 172 mm Hg Lexiscan: 0.4 mg   Stress Test Technologist:  Milana Na, EMT-P     Nuclear Technologist:  Doyne Keel, CNMT  Rest Procedure  Myocardial perfusion imaging was performed at rest 45 minutes following the intravenous administration of Technetium 45m Tetrofosmin.  Stress Procedure  The patient received IV Lexiscan 0.4 mg over 15-seconds with concurrent low level exercise and then Technetium 31m Tetrofosmin was injected at 30-seconds while the patient continued walking one more minute.  There were no significant changes and rare pvcs with Lexiscan.  Quantitative spect images were obtained after a 45 minute delay.  QPS Raw Data Images:  Soft tissue (dipahragm, subcutaneous fat) surround heart. Stress Images:  Decreased counts in the inferior wall (base, minimal mid).  Minimal apical thinning. Rest  Images:  No significant change from the stress images. Subtraction (SDS):  No evidence of ischemia. Transient Ischemic Dilatation:  1.06  (Normal <1.22)  Lung/Heart Ratio:  0.31  (Normal <0.45)  Quantitative Gated Spect Images QGS EDV:  90 ml QGS ESV:  36 ml QGS EF:  60 % QGS cine images:  Hypokinesis in the basal inferior wall.   Overall Impression  Exercise Capacity: Lexiscan with low level exercise BP Response: Normal blood pressure response. Clinical Symptoms: Weak in chest ECG Impression: No significant ST segment change suggestive of ischemia. Overall Impression: Basal inferior scar, cannot exclude coexistent soft tissue attenuation.  No significant ischemia.

## 2011-01-03 ENCOUNTER — Other Ambulatory Visit: Payer: Self-pay | Admitting: Oncology

## 2011-01-03 ENCOUNTER — Encounter (HOSPITAL_BASED_OUTPATIENT_CLINIC_OR_DEPARTMENT_OTHER): Payer: Medicare Other | Admitting: Oncology

## 2011-01-03 DIAGNOSIS — Z85038 Personal history of other malignant neoplasm of large intestine: Secondary | ICD-10-CM

## 2011-01-03 DIAGNOSIS — Z85048 Personal history of other malignant neoplasm of rectum, rectosigmoid junction, and anus: Secondary | ICD-10-CM

## 2011-01-03 DIAGNOSIS — C20 Malignant neoplasm of rectum: Secondary | ICD-10-CM

## 2011-01-03 LAB — CBC WITH DIFFERENTIAL/PLATELET
BASO%: 0.4 % (ref 0.0–2.0)
EOS%: 3.8 % (ref 0.0–7.0)
Eosinophils Absolute: 0.3 10*3/uL (ref 0.0–0.5)
LYMPH%: 17.2 % (ref 14.0–49.0)
MCHC: 34.6 g/dL (ref 32.0–36.0)
MCV: 101.5 fL — ABNORMAL HIGH (ref 79.3–98.0)
MONO%: 9 % (ref 0.0–14.0)
NEUT#: 5.5 10*3/uL (ref 1.5–6.5)
RBC: 4.07 10*6/uL — ABNORMAL LOW (ref 4.20–5.82)
RDW: 12.7 % (ref 11.0–14.6)

## 2011-01-03 LAB — COMPREHENSIVE METABOLIC PANEL
ALT: 25 U/L (ref 0–53)
AST: 24 U/L (ref 0–37)
Albumin: 4 g/dL (ref 3.5–5.2)
Alkaline Phosphatase: 63 U/L (ref 39–117)
Potassium: 4.3 mEq/L (ref 3.5–5.3)
Sodium: 141 mEq/L (ref 135–145)
Total Bilirubin: 1 mg/dL (ref 0.3–1.2)
Total Protein: 6.5 g/dL (ref 6.0–8.3)

## 2011-01-17 ENCOUNTER — Encounter (HOSPITAL_BASED_OUTPATIENT_CLINIC_OR_DEPARTMENT_OTHER): Payer: Medicare Other | Admitting: Oncology

## 2011-01-17 DIAGNOSIS — Z09 Encounter for follow-up examination after completed treatment for conditions other than malignant neoplasm: Secondary | ICD-10-CM

## 2011-01-17 DIAGNOSIS — Z85038 Personal history of other malignant neoplasm of large intestine: Secondary | ICD-10-CM

## 2011-01-17 DIAGNOSIS — Z85048 Personal history of other malignant neoplasm of rectum, rectosigmoid junction, and anus: Secondary | ICD-10-CM

## 2011-02-07 ENCOUNTER — Encounter: Payer: Self-pay | Admitting: Cardiology

## 2011-03-07 NOTE — H&P (Signed)
[facility]  Patient:    TORREZ, RENFROE Visit Number: [id number] MRN: [id number]          Service Type: MED Location: [phone] Attending Physician:  Armanda Magic Dictated by:   Armanda Magic, M.D. Admit Date:  04/06/2002   CC:         Colleen Can. Deborah Chalk, M.D.   History and Physical  DATE OF BIRTH:  2028-11-02  CHIEF COMPLAINT:  Chest pain.  HISTORY OF PRESENT ILLNESS:  This is a 77 year old white male who presented to the emergency room with chest pain.  He said yesterday that he felt weak all day and did not feel himself.  Last evening around 9 p.m., he developed substernal chest pain and shoulder pain very similar to what he had with his previous MI while at rest, lasting 10 minutes and resolving spontaneously. There was no associated shortness of breath, nausea, vomiting, or diaphoresis. The pain recurred about 1:30 a.m. and he presented to the hospital.  He said that the pain lasted about 45 minutes, but resolved on its own after taking an aspirin.  He did not take any nitroglycerin.  He currently is pain-free.  PAST MEDICAL HISTORY:  Significant for myocardial infarction in 1995.  He is status post PTCA and stenting of what sounds like the left circumflex.  There are no records available at this time.  Exercise Cardiolite study several weeks ago done in Dr. Colleen Can. Tennants office reportedly was normal.  He has a history of colon cancer x 2, currently on chemotherapy.  He has had a colectomy x 2.  He has a history of hypertension, diabetes mellitus, and hyperlipidemia.  ALLERGIES:  None.  SOCIAL HISTORY:  He is married.  He denies tobacco or alcohol use.  FAMILY HISTORY:  Noncontributory.  MEDICATIONS: 1. Hyzaar 100/25 mg q.d. 2. Lipitor 10 mg q.d. 3. Aspirin 325 mg q.d. 4. Multivitamins.  REVIEW OF SYSTEMS:  Negative other than what is stated in the HPI.  PHYSICAL EXAMINATION:  The blood pressure on admission was 181/100  and heart rate 74.  GENERAL APPEARANCE:  This is a well-developed, well-nourished, white male in no acute distress.  HEENT:  Benign.  NECK:  Supple without lymphadenopathy.  No bruits.  Carotid upstrokes +2 bilaterally.  LUNGS:  Clear to auscultation throughout.  HEART:  Regular rate and rhythm.  No murmurs, rubs, or gallops.  Normal S1 and S2.  ABDOMEN:  Soft, nontender, and nondistended with active bowel sounds.  No hepatosplenomegaly.  EXTREMITIES:  No cyanosis, erythema, or edema.  Good distal pulses.  Dorsalis pedis pulses +2 bilaterally.  LABORATORY DATA:  CMET is pending.  White cell count 4.1, hematocrit 38.3, hemoglobin 13.1, platelet count 233.  CPK 82, MB 1.4, troponin 0.1.  The EKG shows normal sinus rhythm with nonspecific ST abnormality.  ASSESSMENT: 1. Chest pain with a history of coronary disease and myocardial infarction.    Recent stress Cardiolite study with no ischemia.  Cardiac enzymes are    borderline with negative CPK and positive troponin.  He is currently    pain-free. 2. Hyperlipidemia. 3. Hypertension. 4. Colon cancer on chemotherapy.  PLAN: 1. Admit to the chest pain unit. 2. Rule out myocardial infarction with serial enzymes. 3. Subcu Lovenox. 4. Lopressor 25 mg b.i.d. 5. Aspirin. 6. Transfer to [facility] for further work-up per Colleen Can. Deborah Chalk, M.D. Dictated by:   Armanda Magic, M.D. Attending Physician:  Armanda Magic  DD:  04/06/02 TD:  04/07/02 Job: [id number]

## 2011-03-07 NOTE — Cardiovascular Report (Signed)
[place]  Patient:    Samuel Santiago, Samuel Santiago Visit Number: [id number] MRN: [id number]          Service Type: MED Location: [phone] Attending Physician:  Eleanora Neighbor Dictated by:   Peter M. [place], M.D. Proc. Date: 04/07/02 Admit Date:  04/06/2002 Discharge Date: 04/08/2002                          Cardiac Catheterization  NO DICTATION Dictated by:   Peter M. [place], M.D. Attending Physician:  Eleanora Neighbor DD:  04/07/02 TD:  04/09/02 Job: [id number]

## 2011-03-07 NOTE — Op Note (Signed)
   NAME:  Samuel Santiago, Samuel Santiago                        ACCOUNT NO.:  [id number]   MEDICAL RECORD NO.:  [id number]                   PATIENT TYPE:  AMB   LOCATION:  [facility]SC                                  FACILITY:  [facility]   PHYSICIAN:  Thornton Park. Daphine Deutscher, M.D.             DATE OF BIRTH:  [birth year]   DATE OF PROCEDURE:  07/12/2002  DATE OF DISCHARGE:                                 OPERATIVE REPORT   PREOPERATIVE DIAGNOSIS:  History of colon cancer with Port-A-Cath.   POSTOPERATIVE DIAGNOSIS:  History of colon cancer with Port-A-Cath.   PROCEDURE:  Explantation of Port-A-Cath.   SURGEON:  Thornton Park. Daphine Deutscher, M.D.   ANESTHESIA:  MAC.   DESCRIPTION OF PROCEDURE:  The patient was taken to room 3 and given MAC  anesthesia.  The left chest was prepped with Betadine and draped sterilely.  The old incision site where the Port-A-Cath was was injected with a mixture  of Marcaine and lidocaine.  The old scar was excised, and the Port-A-Cath  was explained in toto.  The Prolene sutures holding it in were removed.  The  explantation site was dry.  It was then closed with 4-0 Vicryl  subcutaneously and subcuticularly with 5-0 Vicryl with Benzoin and Steri-  Strips on the skin.  The patient tolerated the procedure well.  He will take  some Vicodin if needed at home that he has and will follow up with me as  needed.                                               Thornton Park Daphine Deutscher, M.D.    MBM/MEDQ  D:  07/12/2002  T:  07/13/2002  Job:  [id number]   cc:   Medical Oncology

## 2011-03-07 NOTE — Assessment & Plan Note (Signed)
[facility]                           PRIMARY CARE OFFICE NOTE   BERNIS, SCHREUR                     MRN:          [id number]  DATE:12/25/2006                            DOB:          Mar 28, 1929    PROCEDURE:  Cryosurgery.   INDICATIONS FOR PROCEDURE:  Actinic keratosis on the face.   The risks and benefits explained to the patient in detail, he agreed to  proceed. A total of 8 lesions on the face including ears and nose were  treated with liquid nitrogen in the usual fashion, tolerated well,  complications none.     Georgina Quint. Plotnikov, MD  Electronically Signed    AVP/MedQ  DD: 12/25/2006  DT: 12/25/2006  Job #: [id number]

## 2011-03-07 NOTE — Discharge Summary (Signed)
[name]  Patient:    Samuel Santiago, Samuel Santiago Visit Number: [id number] MRN: [id number]          Service Type: MED Location: [id number] Attending Physician:  Eleanora Neighbor Dictated by:   Peter M. Sw[place]il[place], M.D. Admit Date:  04/06/2002 Discharge Date: 04/08/2002   CC:         Colleen Can. Deborah Chalk, M.D.  Genene Churn. Cyndie Chime, M.D.   Discharge Summary  HISTORY OF PRESENT ILLNESS:  The patient is a 77 year old male with a history of coronary artery disease, status post inferior myocardial infarction in 1995, treated with stenting of the right coronary artery. Subsequent followup cardiac catheterization in 1997 showed continued patency of the stent. He has done well since then until he presented on this occasion with substernal chest pain at rest, associated with shoulder pain. His pain did resolve with sublingual nitroglycerin. He presented to the emergency room.  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 He is currently undergoing a course of chemotherapy for one positive lymph node. He has a history of hypertension, diabetes and hyperlipidemia. For details of his past medical history, social history, family history and physical examination, please see admission history and physical.  LABORATORY DATA:  ECG showed normal sinus rhythm with evidence of old inferior infarction. There were no acute changes.  White count 4200, hemoglobin 12.2, hematocrit 35.4, platelets 233,000. Creatinine 1.0, otherwise ISTAT was unremarkable.  HOSPITAL COURSE:  The patient was admitted to telemetry. He was treated with subcu Lovenox and started on a beta blocker. He had no recurrent chest pain. Subsequent troponins were abnormal at 0.10, followed by 0.26 and then 0.24. His CPKs were all negative. The ECG showed no acute changes. The patient subsequently underwent cardiac catheterization on April 07, 2002. This demonstrated  single vessel obstructive coronary artery disease. There was a 95% stenosis in the mid right coronary artery. The previously stented segment in the proximal right coronary artery was still patent. There was 40% disease in the mid circumflex. The left ventricular function showed hypokinesia of the basal to mid inferior wall with overall ejection fraction of approximately 55%.  The patient underwent successful stenting of the mid right coronary artery using a 3.0 x 18 mm Cipher drug eluding stent. An excellent angiographic result was obtained. The patient had no complications post procedure. His ECG remained stable. His followup hemoglobin the next day was 11.7. His platelet count remained stable. The patient was discharged to home in stable condition on April 08, 2002.  DISCHARGE DIAGNOSES 1. Non Q wave myocardial infarction. 2. Hypertension. 3. Diabetes mellitus, diet controlled. 4. Hyperlipidemia. 5. Colon cancer, currently undergoing chemotherapy.  DISCHARGE MEDICATIONS 1. Enteric coated aspirin 325 mg q.d. 2. Lipitor 10 mg q.d. 3. Hyzaar 100/25 mg q.d. 4. Toprol XL 50 mg q.d. 5. Plavix 75 mg q.d. x 3 months. 6. Sublingual nitroglycerin p.r.n.  DISCHARGE INSTRUCTIONS:  The patient is to progressively walk. He is to avoid heavy lifting or straining for five days. He is to remain on a low fat diet. He will follow up with Dr. Deborah Chalk in one to two weeks. Dictated by:   Peter M. [place], M.D. Attending Physician:  Eleanora Neighbor DD:  04/08/02 TD:  04/09/02 Job: [id number]

## 2011-05-05 ENCOUNTER — Other Ambulatory Visit: Payer: Self-pay | Admitting: *Deleted

## 2011-05-05 MED ORDER — BENAZEPRIL HCL 20 MG PO TABS: 20.0000 mg | ORAL_TABLET | Freq: Every day | ORAL | Status: DC

## 2011-05-26 ENCOUNTER — Other Ambulatory Visit: Payer: Self-pay | Admitting: Internal Medicine

## 2011-05-26 ENCOUNTER — Other Ambulatory Visit (INDEPENDENT_AMBULATORY_CARE_PROVIDER_SITE_OTHER): Payer: Medicare Other

## 2011-05-26 DIAGNOSIS — R799 Abnormal finding of blood chemistry, unspecified: Secondary | ICD-10-CM

## 2011-05-26 DIAGNOSIS — N3000 Acute cystitis without hematuria: Secondary | ICD-10-CM

## 2011-05-26 DIAGNOSIS — I10 Essential (primary) hypertension: Secondary | ICD-10-CM

## 2011-05-26 DIAGNOSIS — Z79899 Other long term (current) drug therapy: Secondary | ICD-10-CM

## 2011-05-26 LAB — BASIC METABOLIC PANEL
CO2: 27 mEq/L (ref 19–32)
Calcium: 9.4 mg/dL (ref 8.4–10.5)
Chloride: 103 mEq/L (ref 96–112)
Creatinine, Ser: 1.1 mg/dL (ref 0.4–1.5)
Glucose, Bld: 100 mg/dL — ABNORMAL HIGH (ref 70–99)

## 2011-05-26 LAB — URINALYSIS
Bilirubin Urine: NEGATIVE
Hgb urine dipstick: NEGATIVE
Ketones, ur: NEGATIVE
Nitrite: NEGATIVE
Total Protein, Urine: NEGATIVE
Urine Glucose: NEGATIVE
pH: 6 (ref 5.0–8.0)

## 2011-05-26 LAB — CBC WITH DIFFERENTIAL/PLATELET
Eosinophils Relative: 4.3 % (ref 0.0–5.0)
HCT: 43.6 % (ref 39.0–52.0)
Hemoglobin: 14.8 g/dL (ref 13.0–17.0)
Lymphs Abs: 1.2 10*3/uL (ref 0.7–4.0)
MCV: 103.3 fl — ABNORMAL HIGH (ref 78.0–100.0)
Monocytes Absolute: 0.9 10*3/uL (ref 0.1–1.0)
Monocytes Relative: 12.6 % — ABNORMAL HIGH (ref 3.0–12.0)
Neutro Abs: 4.4 10*3/uL (ref 1.4–7.7)
Platelets: 144 10*3/uL — ABNORMAL LOW (ref 150.0–400.0)
RDW: 13.2 % (ref 11.5–14.6)
WBC: 6.9 10*3/uL (ref 4.5–10.5)

## 2011-05-26 LAB — HEPATIC FUNCTION PANEL
ALT: 30 U/L (ref 0–53)
Albumin: 4.2 g/dL (ref 3.5–5.2)
Total Protein: 7 g/dL (ref 6.0–8.3)

## 2011-05-26 LAB — LIPID PANEL
HDL: 42 mg/dL (ref >39.00)
Total CHOL/HDL Ratio: 3
Triglycerides: 94 mg/dL (ref 0.0–149.0)

## 2011-05-28 ENCOUNTER — Encounter: Payer: Self-pay | Admitting: Nurse Practitioner

## 2011-05-28 LAB — PROTEIN ELECTROPHORESIS, SERUM
Albumin ELP: 57.6 % (ref 55.8–66.1)
Alpha-1-Globulin: 4.2 % (ref 2.9–4.9)
Alpha-2-Globulin: 9.9 % (ref 7.1–11.8)
Beta 2: 4.5 % (ref 3.2–6.5)
Beta Globulin: 5.8 % (ref 4.7–7.2)
Gamma Globulin: 18 % (ref 11.1–18.8)

## 2011-06-02 ENCOUNTER — Ambulatory Visit (INDEPENDENT_AMBULATORY_CARE_PROVIDER_SITE_OTHER): Payer: Medicare Other | Admitting: Internal Medicine

## 2011-06-02 ENCOUNTER — Encounter: Payer: Self-pay | Admitting: Internal Medicine

## 2011-06-02 DIAGNOSIS — D7589 Other specified diseases of blood and blood-forming organs: Secondary | ICD-10-CM

## 2011-06-02 DIAGNOSIS — R279 Unspecified lack of coordination: Secondary | ICD-10-CM

## 2011-06-02 DIAGNOSIS — I2510 Atherosclerotic heart disease of native coronary artery without angina pectoris: Secondary | ICD-10-CM

## 2011-06-02 DIAGNOSIS — I10 Essential (primary) hypertension: Secondary | ICD-10-CM

## 2011-06-02 DIAGNOSIS — R799 Abnormal finding of blood chemistry, unspecified: Secondary | ICD-10-CM

## 2011-06-02 DIAGNOSIS — R270 Ataxia, unspecified: Secondary | ICD-10-CM

## 2011-06-02 NOTE — Patient Instructions (Signed)
Exercise for balance

## 2011-06-02 NOTE — Assessment & Plan Note (Signed)
Cont Rx 

## 2011-06-02 NOTE — Assessment & Plan Note (Signed)
Monitoring

## 2011-06-02 NOTE — Progress Notes (Signed)
  Subjective:    Patient ID: Samuel Santiago, male    DOB: 05/12/1929, 77 y.o.   MRN: [id number]  HPI The patient presents for a follow-up of  chronic hypertension, chronic dyslipidemia,  controlled with medicines, elev MCV    Review of Systems  Constitutional: Negative for appetite change, fatigue and unexpected weight change.  HENT: Negative for nosebleeds, congestion, sore throat, sneezing, trouble swallowing and neck pain.   Eyes: Negative for itching and visual disturbance.  Respiratory: Negative for cough.   Cardiovascular: Negative for chest pain, palpitations and leg swelling.  Gastrointestinal: Negative for nausea, diarrhea, blood in stool and abdominal distention.  Genitourinary: Negative for frequency and hematuria.  Musculoskeletal: Negative for back pain, joint swelling and gait problem.  Skin: Negative for rash.  Neurological: Negative for dizziness, tremors, speech difficulty and weakness.  Psychiatric/Behavioral: Negative for sleep disturbance, dysphoric mood and agitation. The patient is not nervous/anxious.        Objective:   Physical Exam  Constitutional: He is oriented to person, place, and time. He appears well-developed.  HENT:  Mouth/Throat: Oropharynx is clear and moist.  Eyes: Conjunctivae are normal. Pupils are equal, round, and reactive to light.  Neck: Normal range of motion. No JVD present. No thyromegaly present.  Cardiovascular: Normal rate, regular rhythm, normal heart sounds and intact distal pulses.  Exam reveals no gallop and no friction rub.   No murmur heard. Pulmonary/Chest: Effort normal and breath sounds normal. No respiratory distress. He has no wheezes. He has no rales. He exhibits no tenderness.  Abdominal: Soft. Bowel sounds are normal. He exhibits no distension and no mass. There is no tenderness. There is no rebound and no guarding.  Musculoskeletal: Normal range of motion. He exhibits no edema and no tenderness.  Lymphadenopathy:   He has no cervical adenopathy.  Neurological: He is alert and oriented to person, place, and time. He has normal reflexes. No cranial nerve deficit. He exhibits normal muscle tone. Coordination normal.  Skin: Skin is warm and dry. No rash noted.  Psychiatric: He has a normal mood and affect. His behavior is normal. Judgment and thought content normal.  Ataxic   MCV 103  Lab Results  Component Value Date   WBC 6.9 05/26/2011   HGB 14.8 05/26/2011   HCT 43.6 05/26/2011   PLT 144.0 Repeated and verified X2.* 05/26/2011   CHOL 146 05/26/2011   TRIG 94.0 05/26/2011   HDL 42.00 05/26/2011   ALT 30 05/26/2011   AST 25 05/26/2011   NA 139 05/26/2011   K 4.9 05/26/2011   CL 103 05/26/2011   CREATININE 1.1 05/26/2011   BUN 30* 05/26/2011   CO2 27 05/26/2011   TSH 0.74 11/28/2010   PSA 1.22 11/28/2010        Assessment & Plan:

## 2011-06-02 NOTE — Assessment & Plan Note (Signed)
On Rx 

## 2011-06-02 NOTE — Assessment & Plan Note (Signed)
Exercises for home given

## 2011-06-05 ENCOUNTER — Ambulatory Visit (INDEPENDENT_AMBULATORY_CARE_PROVIDER_SITE_OTHER): Payer: Medicare Other | Admitting: Nurse Practitioner

## 2011-06-05 ENCOUNTER — Encounter: Payer: Self-pay | Admitting: Nurse Practitioner

## 2011-06-05 DIAGNOSIS — I10 Essential (primary) hypertension: Secondary | ICD-10-CM

## 2011-06-05 DIAGNOSIS — I2510 Atherosclerotic heart disease of native coronary artery without angina pectoris: Secondary | ICD-10-CM

## 2011-06-05 DIAGNOSIS — E785 Hyperlipidemia, unspecified: Secondary | ICD-10-CM

## 2011-06-05 MED ORDER — HYDROCHLOROTHIAZIDE 25 MG PO TABS: 25.0000 mg | ORAL_TABLET | Freq: Every day | ORAL | Status: DC

## 2011-06-05 NOTE — Assessment & Plan Note (Signed)
He has had recent labs. No change in his therapy. Exercise is encouraged.

## 2011-06-05 NOTE — Progress Notes (Signed)
Samuel Santiago Date of Birth: [birth year]   History of Present Illness: Samuel Santiago is seen today for his 6 month visit. He is seen for Dr. Shirlee Latch. He is a former patient of Dr. Deborah Chalk. He is doing well. He has known CAD. He had a negative nuclear earlier this year. He does not really exercise and his weight is going up. He only walks his dog for short distances.  I cautioned him about that. No chest pain. He is not short of breath. Blood pressure has been ok at home but has not been checking regularly. Our med list has both lotensin and lotensin/hct. He thinks he is only on plain lotensin. He is not dizzy and overall has been feeling ok. He will be 82 later this year.   Current Outpatient Prescriptions on File Prior to Visit  Medication Sig Dispense Refill  . aspirin 325 MG tablet Take 325 mg by mouth daily.        . benazepril (LOTENSIN) 20 MG tablet Take 1 tablet (20 mg total) by mouth daily.  90 tablet  1  . Cholecalciferol (VITAMIN D PO) Take by mouth.        . ezetimibe-simvastatin (VYTORIN) 10-20 MG per tablet Take 1 tablet by mouth at bedtime.        . Multiple Vitamin (MULTIVITAMIN PO) Take by mouth.        Marland Kitchen omeprazole (PRILOSEC OTC) 20 MG tablet Take 20 mg by mouth daily.        . Pyridoxine HCl (VITAMIN B6 PO) Take by mouth.        . simvastatin (ZOCOR) 80 MG tablet Take 80 mg by mouth at bedtime.          No Known Allergies  Past Medical History  Diagnosis Date  . Hyperlipidemia   . History of nephrolithiasis   . Colon cancer 1990    Had recurrence in 2003 resultinbg in chemotherapy. He recently had a colonoscopy and  had a polyp removed that was benign.  . Hypertension     probably related to lack of morning medications and stress of being at most at night in hospital with his wife  . Ischemic heart disease     has had remote MI in 1997 and was treated with TPA. Prior PCI to RCA with repeat PCI to the RCA in 2003  . Kidney stone     x11  . Normal nuclear stress test  Feb 2012    No significant ischemia. EF 56%; with basal inferior scar    Past Surgical History  Procedure Date  . Colonoscopy     colon cancer  . Coronary stent placement 1997 & 2003    PCI to RCA x 2    History  Smoking status  . Former Smoker  . Quit date: 10/20/1985  Smokeless tobacco  . Not on file    History  Alcohol Use No    Family History  Problem Relation Age of Onset  . Arthritis Sister   . Cancer Sister     Review of Systems: The review of systems is as above.  All other systems were reviewed and are negative.  Physical Exam: BP 148/88  Pulse 66  Ht 5\' 6"  (1.676 m)  Wt 182 lb (82.555 kg)  BMI 29.38 kg/m2 Patient is very pleasant and in no acute distress. Skin is warm and dry. Color is normal.  HEENT is unremarkable except for his blepharitis which is chronic. Normocephalic/atraumatic. PERRL. Sclera  are nonicteric. Neck is supple. No masses. No JVD. Lungs are clear. Cardiac exam shows a regular rate and rhythm. Abdomen is obese and soft. Extremities are without edema. Gait and ROM are intact. No gross neurologic deficits noted.  LABORATORY DATA:   Assessment / Plan:

## 2011-06-05 NOTE — Progress Notes (Signed)
Agree with note.  Samuel Santiago  

## 2011-06-05 NOTE — Assessment & Plan Note (Signed)
Blood pressure is up here. He says it is ok at home. He will have Kathie Rhodes check some readings at home. Our paper chart says he has been on lotensin/hct chronically. I have advised him to get back on the HCTZ.

## 2011-06-05 NOTE — Patient Instructions (Addendum)
Check your medicine bottles at home I want you on Benazepril (Lotensin) 20 mg along with HCTZ 25 mg daily If you are just on the Benazepril then get the prescription for the HCTZ filled and take each day If you are on Benazepril/HCT do not get the prescription filled You will see Dr. Marca Ancona in 6 months Have [facility] check your blood pressure for me Call for any problems.

## 2011-06-05 NOTE — Assessment & Plan Note (Signed)
He is doing ok. I encouraged regular exercise and weight reduction. We will see him back in about 6 months. Patient is agreeable to this plan and will call if any problems develop in the interim.

## 2011-09-25 ENCOUNTER — Other Ambulatory Visit: Payer: Self-pay | Admitting: *Deleted

## 2011-09-25 MED ORDER — OMEPRAZOLE MAGNESIUM 20 MG PO TBEC: 20.0000 mg | DELAYED_RELEASE_TABLET | Freq: Every day | ORAL | Status: DC

## 2011-11-10 ENCOUNTER — Other Ambulatory Visit: Payer: Self-pay | Admitting: *Deleted

## 2011-11-10 MED ORDER — BENAZEPRIL HCL 20 MG PO TABS: 20.0000 mg | ORAL_TABLET | Freq: Every day | ORAL | Status: DC

## 2011-12-04 ENCOUNTER — Encounter: Payer: Self-pay | Admitting: Internal Medicine

## 2011-12-04 ENCOUNTER — Ambulatory Visit (INDEPENDENT_AMBULATORY_CARE_PROVIDER_SITE_OTHER): Payer: Medicare Other | Admitting: Internal Medicine

## 2011-12-04 VITALS — BP 160/90 | HR 88 | Temp 97.8°F | Resp 16 | Wt 181.0 lb

## 2011-12-04 DIAGNOSIS — I10 Essential (primary) hypertension: Secondary | ICD-10-CM

## 2011-12-04 DIAGNOSIS — I2510 Atherosclerotic heart disease of native coronary artery without angina pectoris: Secondary | ICD-10-CM

## 2011-12-04 DIAGNOSIS — E785 Hyperlipidemia, unspecified: Secondary | ICD-10-CM

## 2011-12-04 MED ORDER — BENAZEPRIL HCL 20 MG PO TABS: 20.0000 mg | ORAL_TABLET | Freq: Two times a day (BID) | ORAL | Status: DC

## 2011-12-04 NOTE — Assessment & Plan Note (Signed)
Continue with current prescription therapy as reflected on the Med list.  

## 2011-12-04 NOTE — Patient Instructions (Signed)
BP Readings from Last 3 Encounters:  12/04/11 160/90  06/05/11 148/88  06/02/11 140/88    Wt Readings from Last 3 Encounters:  12/04/11 181 lb (82.101 kg)  06/05/11 182 lb (82.555 kg)  06/02/11 182 lb (82.555 kg)

## 2011-12-04 NOTE — Progress Notes (Signed)
Patient ID: [name], male   DOB: 08-Nov-1928, 78 y.o.   MRN: [id number]  Subjective:    Patient ID: [name], male    DOB: August 04, 1929, 78 y.o.   MRN: [id number]  HPI The patient presents for a follow-up of  chronic hypertension, chronic dyslipidemia,  controlled with medicines, elev MCV  BP Readings from Last 3 Encounters:  12/04/11 160/90  06/05/11 148/88  06/02/11 140/88       Review of Systems  Constitutional: Negative for appetite change, fatigue and unexpected weight change.  HENT: Negative for nosebleeds, congestion, sore throat, sneezing, trouble swallowing and neck pain.   Eyes: Negative for itching and visual disturbance.  Respiratory: Negative for cough.   Cardiovascular: Negative for chest pain, palpitations and leg swelling.  Gastrointestinal: Negative for nausea, diarrhea, blood in stool and abdominal distention.  Genitourinary: Negative for frequency and hematuria.  Musculoskeletal: Negative for back pain, joint swelling and gait problem.  Skin: Negative for rash.  Neurological: Negative for dizziness, tremors, speech difficulty and weakness.  Psychiatric/Behavioral: Negative for sleep disturbance, dysphoric mood and agitation. The patient is not nervous/anxious.        Objective:   Physical Exam  Constitutional: He is oriented to person, place, and time. He appears well-developed.  HENT:  Mouth/Throat: Oropharynx is clear and moist.  Eyes: Conjunctivae are normal. Pupils are equal, round, and reactive to light.  Neck: Normal range of motion. No JVD present. No thyromegaly present.  Cardiovascular: Normal rate, regular rhythm, normal heart sounds and intact distal pulses.  Exam reveals no gallop and no friction rub.   No murmur heard. Pulmonary/Chest: Effort normal and breath sounds normal. No respiratory distress. He has no wheezes. He has no rales. He exhibits no tenderness.  Abdominal: Soft. Bowel sounds are normal. He exhibits no distension  and no mass. There is no tenderness. There is no rebound and no guarding.  Musculoskeletal: Normal range of motion. He exhibits no edema and no tenderness.  Lymphadenopathy:    He has no cervical adenopathy.  Neurological: He is alert and oriented to person, place, and time. He has normal reflexes. No cranial nerve deficit. He exhibits normal muscle tone. Coordination normal.  Skin: Skin is warm and dry. No rash noted.  Psychiatric: He has a normal mood and affect. His behavior is normal. Judgment and thought content normal.  Ataxic   MCV 103        Assessment & Plan:

## 2011-12-11 ENCOUNTER — Encounter: Payer: Self-pay | Admitting: Cardiology

## 2011-12-11 ENCOUNTER — Ambulatory Visit (INDEPENDENT_AMBULATORY_CARE_PROVIDER_SITE_OTHER): Payer: Medicare Other | Admitting: Cardiology

## 2011-12-11 DIAGNOSIS — E785 Hyperlipidemia, unspecified: Secondary | ICD-10-CM

## 2011-12-11 DIAGNOSIS — I2510 Atherosclerotic heart disease of native coronary artery without angina pectoris: Secondary | ICD-10-CM

## 2011-12-11 DIAGNOSIS — I10 Essential (primary) hypertension: Secondary | ICD-10-CM

## 2011-12-11 NOTE — Assessment & Plan Note (Signed)
Goal LDL < 70.  He is on simvastatin 80 mg daily but has been on this dose for > 1 year.  No evidence for side effects.  Will continue for now.  Check lipids/LFTs today.

## 2011-12-11 NOTE — Assessment & Plan Note (Signed)
Recent increase in benazepril.  BP is high in the office but has been normal on last few home readings.  I will have him check BP daily at home and we will call in 2 wks to see what the numbers are running.

## 2011-12-11 NOTE — Assessment & Plan Note (Signed)
Normal myoview in 2/12.  No ischemic symptoms.  Continue current regimen with ASA, ACEI, simvastatin.

## 2011-12-11 NOTE — Progress Notes (Signed)
PCP: Dr. Posey Rea  78 yo with history of MI and HTN presents for cardiology followup.  He has seen Dr. Deborah Chalk in the past and is seen by me for the first time today.  He had an MI in 1997 treated with thrombolysis.  He later had 2 stents in the RCA.  Last myoview in 2/12 was normal.    Samuel Santiago has been doing well.  He is fairly active.  He walks his dog for about a mile total on most days.  No exertional chest pain or dyspnea.  BP is high today in the office but has been running primarily in the 120s-130s when he checks at home.  Last week, Dr. Posey Rea increased his benazepril to 20 mg bid.    Labs (8/12): K 4.9, creatinine 1.1, LDL 85, HDL 42, SPEP normal  PMH: 1. CAD: MI in 1997 treated with tPA. He subsequently had PCI to the RCA, then repeat PCI to the RCA in 2003.  Myoview (2/12): EF 56%, basal inferior scar.   2. Colon cancer in 1990 with colectomy.  Recurrence in 2003 treated with chemotherapy.   3. HTN 4. Nephrolithiasis 5. Hyperlipidemia  SH: Lives in [place]. Retired from a Games developer.  Married, quit smoking in the 1980s.    FH: CAD  ROS: All systems reviewed and negative except as per HPI.   Current Outpatient Prescriptions  Medication Sig Dispense Refill  . aspirin 325 MG tablet Take 325 mg by mouth daily.        . benazepril (LOTENSIN) 20 MG tablet Take 1 tablet (20 mg total) by mouth 2 (two) times daily.  180 tablet  3  . Cholecalciferol (VITAMIN D PO) Take by mouth.        . hydrochlorothiazide 25 MG tablet Take 1 tablet (25 mg total) by mouth daily.  90 tablet  3  . Multiple Vitamin (MULTIVITAMIN PO) Take by mouth.        Marland Kitchen omeprazole (PRILOSEC OTC) 20 MG tablet Take 1 tablet (20 mg total) by mouth daily.  30 tablet  5  . Pyridoxine HCl (VITAMIN B6 PO) Take by mouth.        . simvastatin (ZOCOR) 80 MG tablet Take 80 mg by mouth at bedtime.          BP 150/84  Pulse 78  Ht 5\' 6"  (1.676 m)  Wt 179 lb (81.194 kg)  BMI 28.89 kg/m2 11:08  PM General: NAD, overweight Neck: No JVD, no thyromegaly or thyroid nodule.  Lungs: Clear to auscultation bilaterally with normal respiratory effort. CV: Nondisplaced PMI.  Heart regular S1/S2, no S3, soft S4, no murmur.  No peripheral edema.  No carotid bruit.  Normal pedal pulses.  Abdomen: Soft, nontender, no hepatosplenomegaly, no distention.  Neurologic: Alert and oriented x 3.  Psych: Normal affect. Extremities: No clubbing or cyanosis.

## 2011-12-11 NOTE — Patient Instructions (Addendum)
Take and record your blood pressure daily. I will call you in about 2 weeks to get the readings. Katina Dung, RN [phone]  Your physician recommends that you return for a FASTING lipid profile /liver profile/BMET  Your physician wants you to follow-up in: 6 months with Sunday Spillers (August 2013) You will receive a reminder letter in the mail two months in advance. If you don't receive a letter, please call our office to schedule the follow-up appointment.     Your physician wants you to follow-up in: 1 year with Dr Shirlee Latch. (February 2014) You will receive a reminder letter in the mail two months in advance. If you don't receive a letter, please call our office to schedule the follow-up appointment.

## 2011-12-16 ENCOUNTER — Other Ambulatory Visit: Payer: Self-pay | Admitting: *Deleted

## 2011-12-16 MED ORDER — SIMVASTATIN 80 MG PO TABS: 80.0000 mg | ORAL_TABLET | Freq: Every day | ORAL | Status: DC

## 2011-12-18 ENCOUNTER — Other Ambulatory Visit (INDEPENDENT_AMBULATORY_CARE_PROVIDER_SITE_OTHER): Payer: Medicare Other

## 2011-12-18 DIAGNOSIS — E785 Hyperlipidemia, unspecified: Secondary | ICD-10-CM

## 2011-12-18 DIAGNOSIS — I2510 Atherosclerotic heart disease of native coronary artery without angina pectoris: Secondary | ICD-10-CM

## 2011-12-18 LAB — HEPATIC FUNCTION PANEL
ALT: 28 U/L (ref 0–53)
Total Bilirubin: 1 mg/dL (ref 0.3–1.2)
Total Protein: 7.3 g/dL (ref 6.0–8.3)

## 2011-12-18 LAB — BASIC METABOLIC PANEL
Chloride: 101 mEq/L (ref 96–112)
GFR: 67.7 mL/min (ref >60.00)
Potassium: 3.8 mEq/L (ref 3.5–5.1)
Sodium: 137 mEq/L (ref 135–145)

## 2011-12-18 LAB — LIPID PANEL
HDL: 44.2 mg/dL (ref >39.00)
Triglycerides: 161 mg/dL — ABNORMAL HIGH (ref 0.0–149.0)

## 2011-12-22 ENCOUNTER — Telehealth: Payer: Self-pay | Admitting: Oncology

## 2011-12-22 NOTE — Telephone Encounter (Signed)
appt printed and mailed for pt per mosaiq,aom

## 2011-12-25 ENCOUNTER — Telehealth: Payer: Self-pay | Admitting: *Deleted

## 2011-12-25 NOTE — Telephone Encounter (Signed)
HYPERTENSION - Marca Ancona, MD 12/11/2011 11:14 PM Signed  Recent increase in benazepril. BP is high in the office but has been normal on last few home readings. I will have him check BP daily at home and we will call in 2 wks to see what the numbers are running.  12/25/11--talked with pt. Recent BP readings--12/12/11 144/83 12/13/11 138/81 124/72 12/14/11 143/71 121/76 12/15/11 126/73 134/76 12/16/11 119/68 122/75 12/17/11 123/73 129/78 12/18/11 129/78 12/19/11 134/63 12/20/11 149/80 119/ 75 12/21/11  138/70 138/80 12/22/11 127/72 112/62 12/23/11 144/80 142/80 138/79 12/24/11 124/72 143/71 12/25/11 121/76 126/73 134/76 -pt feels good. I will forward to Dr Shirlee Latch for review

## 2011-12-25 NOTE — Telephone Encounter (Signed)
These are mostly ok.  Continue current meds.

## 2011-12-29 NOTE — Telephone Encounter (Signed)
Discussed with pt

## 2011-12-31 ENCOUNTER — Encounter: Payer: Self-pay | Admitting: Internal Medicine

## 2012-01-23 ENCOUNTER — Ambulatory Visit (HOSPITAL_BASED_OUTPATIENT_CLINIC_OR_DEPARTMENT_OTHER): Payer: Medicare Other | Admitting: Oncology

## 2012-01-23 ENCOUNTER — Encounter: Payer: Self-pay | Admitting: Oncology

## 2012-01-23 ENCOUNTER — Telehealth: Payer: Self-pay | Admitting: Oncology

## 2012-01-23 VITALS — BP 153/78 | HR 74 | Temp 96.8°F | Ht 66.0 in | Wt 178.1 lb

## 2012-01-23 DIAGNOSIS — Z87442 Personal history of urinary calculi: Secondary | ICD-10-CM

## 2012-01-23 DIAGNOSIS — Z85038 Personal history of other malignant neoplasm of large intestine: Secondary | ICD-10-CM

## 2012-01-23 HISTORY — DX: Personal history of urinary calculi: Z87.442

## 2012-01-23 NOTE — Telephone Encounter (Signed)
Per dr gee's email for the pt to return prn.

## 2012-01-23 NOTE — Progress Notes (Signed)
Hematology and Oncology Follow Up Visit  Samuel Santiago [id number] October 25, 1928 78 y.o. 01/23/2012 2:03 PM   Principle Diagnosis: Encounter Diagnoses  Name Primary?  . H/O colon cancer, stage III Yes  . History of nephrolithiasis      Interim History:   Followup visit for this 78 year old man with history of metachronous primary stage III colon cancers initial stage III , 3node positive cancer of the rectum diagnosed December 1990. Second primary stage III, I node positive cancer of the cecum diagnosed February 2003. He received adjuvant chemotherapy and radiation following the diagnosis of the rectal cancer and chemotherapy alone following diagnosis of the colon cancer. He continues to do well. He denies any abdominal pain, no change in bowel habit, he tends to be constipated and uses when necessary laxatives, he denies any hematochezia or melena. He has had no interim medical problems. He saw his cardiologist and was told he didn't need to be seen again for one year. He has not had any recent problems with kidney stones.   Medications: reviewed  Allergies: No Known Allergies  Review of Systems: Constitutional:   No constitutional symptoms Respiratory: No cough or dyspnea Cardiovascular:  No chest pain or palpitations Gastrointestinal: See above Genito-Urinary: No urinary tract symptoms Musculoskeletal: No bone pain Neurologic: No headache or change in vision Skin: No rash Remaining ROS negative.  Physical Exam: Blood pressure 153/78, pulse 74, temperature 96.8 F (36 C), temperature source Oral, height 5\' 6"  (1.676 m), weight 178 lb 1.6 oz (80.786 kg). Wt Readings from Last 3 Encounters:  01/23/12 178 lb 1.6 oz (80.786 kg)  12/11/11 179 lb (81.194 kg)  12/04/11 181 lb (82.101 kg)     General appearance: Well-nourished Caucasian man HENNT: esotropia both eyelids Lymph nodes: No adenopathy Breasts: Lungs: Clear to auscultation resonant to percussion Heart: Regular  rhythm no murmur Abdomen: Soft nontender no mass no organomegaly Extremities: No edema no calf tenderness Vascular: No cyanosis Neurologic: No focal deficit Skin: No rash  Lab Results: Lab Results  Component Value Date   WBC 6.9 05/26/2011   HGB 14.8 05/26/2011   HCT 43.6 05/26/2011   MCV 103.3 Repeated and verified X2.* 05/26/2011   PLT 144.0 Repeated and verified X2.* 05/26/2011     Chemistry      Component Value Date/Time   NA 137 12/18/2011 1004   K 3.8 12/18/2011 1004   CL 101 12/18/2011 1004   CO2 28 12/18/2011 1004   BUN 23 12/18/2011 1004   CREATININE 1.1 12/18/2011 1004      Component Value Date/Time   CALCIUM 9.6 12/18/2011 1004   ALKPHOS 51 12/18/2011 1004   AST 32 12/18/2011 1004   ALT 28 12/18/2011 1004   BILITOT 1.0 12/18/2011 1004       Impression and Plan: Metachronous stage III cancers of the rectum and cecum he remains free of any obvious recurrence now out over 22 years from the rectal cancer and 10 years from the colon cancer. He is due for a routine followup colonoscopy next month. I told him I felt comfortable letting him graduate from our practice at this time.   CC:. Dr. Theodis Sato Plotnikov; Dr. Luretha Murphy   Levert Feinstein, MD 4/5/20132:03 PM

## 2012-02-26 ENCOUNTER — Encounter: Payer: Self-pay | Admitting: Internal Medicine

## 2012-02-26 ENCOUNTER — Ambulatory Visit (INDEPENDENT_AMBULATORY_CARE_PROVIDER_SITE_OTHER): Payer: Medicare Other | Admitting: Internal Medicine

## 2012-02-26 VITALS — BP 104/72 | HR 68 | Ht 66.0 in | Wt 176.4 lb

## 2012-02-26 DIAGNOSIS — K5900 Constipation, unspecified: Secondary | ICD-10-CM

## 2012-02-26 DIAGNOSIS — K219 Gastro-esophageal reflux disease without esophagitis: Secondary | ICD-10-CM

## 2012-02-26 DIAGNOSIS — Z85038 Personal history of other malignant neoplasm of large intestine: Secondary | ICD-10-CM

## 2012-02-26 MED ORDER — PEG-KCL-NACL-NASULF-NA ASC-C 100 G PO SOLR: 1.0000 | Freq: Once | ORAL | Status: DC

## 2012-02-26 NOTE — Progress Notes (Signed)
HISTORY OF PRESENT ILLNESS:  Samuel Santiago is a 78 y.o. male with multiple medical problems as listed below. He is followed in this office for chronic constipation, GERD, and a history of metachronous colon cancer is diagnosed in 71 and again in 2003. He presents today regarding surveillance colonoscopy. He is in the office due to his age of 78 years. His last colonoscopy was 3 years ago. Multiple polyps removed. Suboptimal prep. His interval medical health has been stable, except for transient urologic complaints which have been evaluated without cause found. For his GERD he takes omeprazole. For his bowels he takes different laxatives. He remains active in his interested in a followup exam.  REVIEW OF SYSTEMS:  All non-GI ROS negative   Past Medical History  Diagnosis Date  . Hyperlipidemia   . Colon cancer 1990    Had recurrence in 2003 resultinbg in chemotherapy. He recently had a colonoscopy and  had a polyp removed that was benign.  . Hypertension     probably related to lack of morning medications and stress of being at most at night in hospital with his wife  . Ischemic heart disease     has had remote MI in 1997 and was treated with TPA. Prior PCI to RCA with repeat PCI to the RCA in 2003  . Kidney stone     x11  . Normal nuclear stress test Feb 2012    No significant ischemia. EF 56%; with basal inferior scar  . History of nephrolithiasis 01/23/2012  . Arthritis   . History of colon polyps   . GERD (gastroesophageal reflux disease)     Past Surgical History  Procedure Date  . Colonoscopy     colon cancer  . Coronary stent placement 1997 & 2003    PCI to RCA x 2  . Colon surgery     x 2 for cancer    Social History Samuel Santiago ZOXWRUE  reports that he quit smoking about 26 years ago. He has never used smokeless tobacco. He reports that he does not drink alcohol or use illicit drugs.  family history includes Arthritis in his sister; Breast cancer in his sister; and  Colon polyps in his daughter and son.  No Known Allergies     PHYSICAL EXAMINATION: Vital signs: BP 104/72  Pulse 68  Ht 5\' 6"  (1.676 m)  Wt 176 lb 6 oz (80.003 kg)  BMI 28.47 kg/m2 General: Well-developed, well-nourished, no acute distress HEENT: Sclerae are anicteric, conjunctiva pink. Lower eyelids red and swollen.. Oral mucosa intact Lungs: Clear Heart: Regular Abdomen: soft, nontender, nondistended, no obvious ascites, no peritoneal signs, normal bowel sounds. No organomegaly. Extremities: No edema Psychiatric: alert and oriented x3. Cooperative    ASSESSMENT:  #1. Personal history of metachronous colon cancer. Due for surveillance colonoscopy. No contraindications #2. Personal history of adenomatous colon polyps #3. GERD. Asymptomatic on PPI #4. Chronic constipation #5. General medical problems. Stable    PLAN:  #1. Colonoscopy.The nature of the procedure, as well as the risks, benefits, and alternatives were carefully and thoroughly reviewed with the patient. Ample time for discussion and questions allowed. The patient understood, was satisfied, and agreed to proceed. The patient will need a more extensive prep given his history of poor colonic preps. I advised him with regards to proper dietary restrictions. As well, he will take one bottle of magnesium site. The morning before his exam and then proceed with a normal mood the prep #2. Continue PPI #3. Reflux  precautions #4. MiraLax as needed for constipation #5.general medical care with Dr. Posey Rea

## 2012-02-26 NOTE — Patient Instructions (Signed)
You have been scheduled for a colonoscopy with propofol. Please follow written instructions given to you at your visit today.  Please pick up your magnesium citrate and your prep kit at the pharmacy within the next 1-3 days.

## 2012-03-02 ENCOUNTER — Telehealth: Payer: Self-pay

## 2012-03-02 ENCOUNTER — Telehealth: Payer: Self-pay | Admitting: Internal Medicine

## 2012-03-02 NOTE — Telephone Encounter (Signed)
Wanted to know if Mag Citrate to be taken the morning of procedure or today.  Advised to take Mag Citrate today for 2-day prep

## 2012-03-03 ENCOUNTER — Ambulatory Visit (AMBULATORY_SURGERY_CENTER): Payer: Medicare Other | Admitting: Internal Medicine

## 2012-03-03 ENCOUNTER — Encounter: Payer: Self-pay | Admitting: Internal Medicine

## 2012-03-03 VITALS — BP 117/72 | HR 83 | Temp 97.5°F | Resp 20 | Ht 66.0 in | Wt 176.0 lb

## 2012-03-03 DIAGNOSIS — Z85038 Personal history of other malignant neoplasm of large intestine: Secondary | ICD-10-CM

## 2012-03-03 DIAGNOSIS — Z86010 Personal history of colonic polyps: Secondary | ICD-10-CM

## 2012-03-03 DIAGNOSIS — Z1211 Encounter for screening for malignant neoplasm of colon: Secondary | ICD-10-CM

## 2012-03-03 DIAGNOSIS — D126 Benign neoplasm of colon, unspecified: Secondary | ICD-10-CM

## 2012-03-03 MED ORDER — SODIUM CHLORIDE 0.9 % IV SOLN: 500.0000 mL | INTRAVENOUS | Status: DC

## 2012-03-03 NOTE — Progress Notes (Signed)
Patient did not experience any of the following events: a burn prior to discharge; a fall within the facility; wrong site/side/patient/procedure/implant event; or a hospital transfer or hospital admission upon discharge from the facility. (G8907) Patient did not have preoperative order for IV antibiotic SSI prophylaxis. (G8918)  

## 2012-03-03 NOTE — Op Note (Signed)
[facility] [address] [place], [place]  [address]  COLONOSCOPY PROCEDURE REPORT  PATIENT:  Samuel Santiago, Samuel Santiago  MR#:  [id number] BIRTHDATE:  05-21-1929, 82 yrs. old  GENDER:  male ENDOSCOPIST:  Wilhemina Bonito. Eda Keys, MD REF. BY:  Surveillance Program Recall, PROCEDURE DATE:  03/03/2012 PROCEDURE:  Colonoscopy with snare polypectomy x 1 ASA CLASS:  Class II INDICATIONS:  history of colon cancer, history of pre-cancerous (adenomatous) colon polyps, surveillance and high-risk screening ; metachronous CRC; last exam 02-2009 MEDICATIONS:   MAC sedation, administered by CRNA, propofol (Diprivan) 100 mg IV  DESCRIPTION OF PROCEDURE:   After the risks benefits and alternatives of the procedure were thoroughly explained, informed consent was obtained.  Digital rectal exam was performed and revealed no abnormalities.   The LB [id number] endoscope was introduced through the anus and advanced to the anastomosis, without limitations.  The quality of the prep was good, using MoviPrep.  The instrument was then slowly withdrawn as the colon was fully examined. <<PROCEDUREIMAGES>>  FINDINGS:  There was a surgical anastomosis in the right colon. There was a surgical anastomosis in the sigmoid colon.  Melanosis coli was found.  A 5mm pedunculated polyp was found in the proximal transverse colon. Polyp was snared without cautery. Retrieval was successful.  Retroflexed views in the rectum revealed internal hemorrhoids.    The time to cecum = 2:48 minutes. The scope was then withdrawn in  11:53  minutes from the cecum and the procedure completed.  COMPLICATIONS:  None  ENDOSCOPIC IMPRESSION: 1) Anastomosis in the right colon 2) Anastomosis in the sigmoid colon 3) Melanosis 4) Pedunculated polyp in the proximal transverse colon - removed  5) Internal hemorrhoids  RECOMMENDATIONS: 1) Follow up colonoscopy in 5 years if fit and willing  ______________________________ Wilhemina Bonito.  Eda Keys, MD  CC:  Linda Hedges. Plotnikov, MDThe Patient  n. eSIGNED:   Trea Carnegie N. Eda Keys at 03/03/2012 09:54 AM  Zenaida Niece, [phone]

## 2012-03-03 NOTE — Patient Instructions (Addendum)

## 2012-03-04 ENCOUNTER — Telehealth: Payer: Self-pay | Admitting: *Deleted

## 2012-03-04 ENCOUNTER — Ambulatory Visit: Payer: Medicare Other | Admitting: Internal Medicine

## 2012-03-04 NOTE — Telephone Encounter (Signed)
  Follow up Call-  Call back number 03/03/2012  Post procedure Call Back phone  # cell [phone]  Permission to leave phone message Yes     Patient questions:  Do you have a fever, pain , or abdominal swelling? no Pain Score  0 *  Have you tolerated food without any problems? yes  Have you been able to return to your normal activities? yes  Do you have any questions about your discharge instructions: Diet   no Medications  no Follow up visit  no  Do you have questions or concerns about your Care? no  Actions: * If pain score is 4 or above: No action needed, pain <4.

## 2012-03-04 NOTE — Telephone Encounter (Signed)
Nurse opened another phone note. ? Answered,

## 2012-03-09 ENCOUNTER — Encounter: Payer: Self-pay | Admitting: Internal Medicine

## 2012-03-24 ENCOUNTER — Ambulatory Visit (INDEPENDENT_AMBULATORY_CARE_PROVIDER_SITE_OTHER): Payer: Medicare Other | Admitting: Internal Medicine

## 2012-03-24 ENCOUNTER — Encounter: Payer: Self-pay | Admitting: Internal Medicine

## 2012-03-24 VITALS — BP 150/80 | HR 80 | Temp 97.0°F | Resp 16 | Wt 178.0 lb

## 2012-03-24 DIAGNOSIS — I2510 Atherosclerotic heart disease of native coronary artery without angina pectoris: Secondary | ICD-10-CM

## 2012-03-24 DIAGNOSIS — G4700 Insomnia, unspecified: Secondary | ICD-10-CM

## 2012-03-24 DIAGNOSIS — L719 Rosacea, unspecified: Secondary | ICD-10-CM

## 2012-03-24 DIAGNOSIS — Z85038 Personal history of other malignant neoplasm of large intestine: Secondary | ICD-10-CM

## 2012-03-24 DIAGNOSIS — I10 Essential (primary) hypertension: Secondary | ICD-10-CM

## 2012-03-24 DIAGNOSIS — M1990 Unspecified osteoarthritis, unspecified site: Secondary | ICD-10-CM

## 2012-03-24 MED ORDER — OMEPRAZOLE MAGNESIUM 20 MG PO TBEC: 20.0000 mg | DELAYED_RELEASE_TABLET | Freq: Every day | ORAL | Status: DC

## 2012-03-24 MED ORDER — TEMAZEPAM 7.5 MG PO CAPS: 7.5000 mg | ORAL_CAPSULE | Freq: Every evening | ORAL | Status: DC | PRN

## 2012-03-24 NOTE — Assessment & Plan Note (Signed)
Continue with current prescription therapy as reflected on the Med list.  

## 2012-03-24 NOTE — Assessment & Plan Note (Signed)
Chronic w/blepharitis

## 2012-03-24 NOTE — Assessment & Plan Note (Signed)
Try Temazepam low dose

## 2012-03-24 NOTE — Assessment & Plan Note (Signed)
Continue with current prescription therapy as reflected on the Med list. Better 

## 2012-03-24 NOTE — Progress Notes (Signed)
Patient ID: Samuel Santiago, male   DOB: 06-12-1929, 78 y.o.   MRN: [id number] Patient ID: Samuel Santiago, male   DOB: 1929/09/12, 78 y.o.   MRN: [id number]  Subjective:    Patient ID: Samuel Santiago, male    DOB: 12-17-1928, 78 y.o.   MRN: [id number]  HPI The patient presents for a follow-up of  chronic hypertension, chronic dyslipidemia,  controlled with medicines, elev MCV. C/o insomnia  BP Readings from Last 3 Encounters:  03/24/12 150/80  03/03/12 117/72  02/26/12 104/72    Wt Readings from Last 3 Encounters:  03/24/12 178 lb (80.74 kg)  03/03/12 176 lb (79.833 kg)  02/26/12 176 lb 6 oz (80.003 kg)      Review of Systems  Constitutional: Negative for appetite change, fatigue and unexpected weight change.  HENT: Negative for nosebleeds, congestion, sore throat, sneezing, trouble swallowing and neck pain.   Eyes: Negative for itching and visual disturbance.  Respiratory: Negative for cough.   Cardiovascular: Negative for chest pain, palpitations and leg swelling.  Gastrointestinal: Negative for nausea, diarrhea, blood in stool and abdominal distention.  Genitourinary: Negative for frequency and hematuria.  Musculoskeletal: Negative for back pain, joint swelling and gait problem.  Skin: Negative for rash.  Neurological: Negative for dizziness, tremors, speech difficulty and weakness.  Psychiatric/Behavioral: Negative for sleep disturbance, dysphoric mood and agitation. The patient is not nervous/anxious.        Objective:   Physical Exam  Constitutional: He is oriented to person, place, and time. He appears well-developed.  HENT:  Mouth/Throat: Oropharynx is clear and moist.  Eyes: Conjunctivae are normal. Pupils are equal, round, and reactive to light.  Neck: Normal range of motion. No JVD present. No thyromegaly present.  Cardiovascular: Normal rate, regular rhythm, normal heart sounds and intact distal pulses.  Exam reveals no gallop and no friction rub.   No  murmur heard. Pulmonary/Chest: Effort normal and breath sounds normal. No respiratory distress. He has no wheezes. He has no rales. He exhibits no tenderness.  Abdominal: Soft. Bowel sounds are normal. He exhibits no distension and no mass. There is no tenderness. There is no rebound and no guarding.  Musculoskeletal: Normal range of motion. He exhibits no edema and no tenderness.  Lymphadenopathy:    He has no cervical adenopathy.  Neurological: He is alert and oriented to person, place, and time. He has normal reflexes. No cranial nerve deficit. He exhibits normal muscle tone. Coordination normal.  Skin: Skin is warm and dry. No rash noted.  Psychiatric: He has a normal mood and affect. His behavior is normal. Judgment and thought content normal.  Ataxic   MCV 103  Lab Results  Component Value Date   WBC 6.9 05/26/2011   HGB 14.8 05/26/2011   HCT 43.6 05/26/2011   PLT 144.0 Repeated and verified X2.* 05/26/2011   GLUCOSE 99 12/18/2011   CHOL 159 12/18/2011   TRIG 161.0* 12/18/2011   HDL 44.20 12/18/2011   LDLCALC 83 12/18/2011   ALT 28 12/18/2011   AST 32 12/18/2011   NA 137 12/18/2011   K 3.8 12/18/2011   CL 101 12/18/2011   CREATININE 1.1 12/18/2011   BUN 23 12/18/2011   CO2 28 12/18/2011   TSH 0.74 11/28/2010   PSA 1.22 11/28/2010           Assessment & Plan:

## 2012-03-24 NOTE — Assessment & Plan Note (Signed)
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  Colon in 2018 if willing - Dr Marina Goodell

## 2012-03-24 NOTE — Assessment & Plan Note (Signed)
Has had remote MI in 1997 treated with TPA. He has had prior PCI's to the RCA on 2 occasions.  Last cath in 2003 Last nuclear in Feb 2012 Continue with current prescription therapy as reflected on the Med list.

## 2012-03-26 ENCOUNTER — Telehealth: Payer: Self-pay

## 2012-03-26 MED ORDER — TRIAZOLAM 0.25 MG PO TABS: 0.1250 mg | ORAL_TABLET | Freq: Every evening | ORAL | Status: DC | PRN

## 2012-03-26 NOTE — Telephone Encounter (Signed)
Pharmacy called stating that they do not have Temazepam 7.5 mg capsule, only 15 mg. Pharmacy is requesting medication or dosage change.

## 2012-03-26 NOTE — Telephone Encounter (Signed)
D/c Temazepam Start prn halcion 1/2 or 1 tab daily Thx

## 2012-03-26 NOTE — Telephone Encounter (Signed)
Rx faxed. Pt informed.

## 2012-03-29 ENCOUNTER — Telehealth: Payer: Self-pay | Admitting: Internal Medicine

## 2012-03-29 NOTE — Telephone Encounter (Signed)
Caller: Charlotte Crumb tech/at [facility]; PCP: Sonda Primes; CB#: [phone];  Call regarding Medication Issue;  Temazepan 7.5 mg cap order written 03/24/12 is no longer available.  Requesting  change of RX.  Power was out at pharmacy 03/26/12 so no fax received.  Per Epic, MD received pharmacy request 03/26/12; ordered D/C Temazepam;  RX changed to Triazolam (Halcion) 0.25 mg tabs, #30; sig: 1/2 to 1 tab hs prn, may refill x 3. Provided health information for caller with med question that was answered with available resources per Med Questions Call Guideline.

## 2012-03-29 NOTE — Telephone Encounter (Signed)
Noted. Thx.

## 2012-03-31 ENCOUNTER — Telehealth: Payer: Self-pay

## 2012-03-31 NOTE — Telephone Encounter (Signed)
Pharmacist called to inform MD that PA is required on Halcion 0.25 (see last phone note). Lanora Manis says that power is still out so she is unable to provide any information to start PA process and if MD changed medication it would need to be called to her personal mobile number listed above.

## 2012-03-31 NOTE — Telephone Encounter (Signed)
OK PA What can he get w/o PA? THX

## 2012-04-01 ENCOUNTER — Telehealth: Payer: Self-pay | Admitting: *Deleted

## 2012-04-01 NOTE — Telephone Encounter (Signed)
Requested PA form for Triazolam 0.25 mg at [phone]; awaiting fax/SLS

## 2012-04-01 NOTE — Telephone Encounter (Signed)
Pharmacist is unable to view covered alternatives, system is still down.  PA - [phone] ID [id number]

## 2012-04-02 NOTE — Telephone Encounter (Signed)
PA is approved. Pharmacy informed.  

## 2012-04-02 NOTE — Telephone Encounter (Signed)
Called PA number and gave requested info. PA for Triazolam 0.25 mg is approved through 10/19/12. Pharmacy informed.

## 2012-06-24 ENCOUNTER — Telehealth: Payer: Self-pay | Admitting: Cardiology

## 2012-06-24 MED ORDER — HYDROCHLOROTHIAZIDE 25 MG PO TABS: 25.0000 mg | ORAL_TABLET | Freq: Every day | ORAL | Status: DC

## 2012-06-24 NOTE — Telephone Encounter (Signed)
Please return call to patient at [phone]   Patient needs HTCZ med, which is no longer available on patient med list. Please call pt to advise of new or replacement med

## 2012-06-24 NOTE — Telephone Encounter (Signed)
Pt requesting refill of HCTZ. Pt states he has been taking HCTZ 25mg  daily. Looks like this was removed from his med list but pt states he has been taking it.

## 2012-07-08 ENCOUNTER — Ambulatory Visit: Payer: Medicare Other | Admitting: Cardiology

## 2012-07-26 ENCOUNTER — Encounter: Payer: Self-pay | Admitting: Internal Medicine

## 2012-07-26 ENCOUNTER — Ambulatory Visit (INDEPENDENT_AMBULATORY_CARE_PROVIDER_SITE_OTHER): Payer: Medicare Other | Admitting: Internal Medicine

## 2012-07-26 VITALS — BP 120/72 | HR 76 | Temp 97.5°F | Resp 16 | Wt 175.0 lb

## 2012-07-26 DIAGNOSIS — D7589 Other specified diseases of blood and blood-forming organs: Secondary | ICD-10-CM

## 2012-07-26 DIAGNOSIS — F439 Reaction to severe stress, unspecified: Secondary | ICD-10-CM

## 2012-07-26 DIAGNOSIS — R270 Ataxia, unspecified: Secondary | ICD-10-CM

## 2012-07-26 DIAGNOSIS — I10 Essential (primary) hypertension: Secondary | ICD-10-CM

## 2012-07-26 DIAGNOSIS — H01009 Unspecified blepharitis unspecified eye, unspecified eyelid: Secondary | ICD-10-CM

## 2012-07-26 DIAGNOSIS — N320 Bladder-neck obstruction: Secondary | ICD-10-CM

## 2012-07-26 DIAGNOSIS — Z733 Stress, not elsewhere classified: Secondary | ICD-10-CM

## 2012-07-26 DIAGNOSIS — I2510 Atherosclerotic heart disease of native coronary artery without angina pectoris: Secondary | ICD-10-CM

## 2012-07-26 DIAGNOSIS — R279 Unspecified lack of coordination: Secondary | ICD-10-CM

## 2012-07-26 NOTE — Assessment & Plan Note (Signed)
Watching - no change

## 2012-07-26 NOTE — Assessment & Plan Note (Signed)
Continue with current prescription therapy as reflected on the Med list.  

## 2012-07-26 NOTE — Progress Notes (Signed)
   Subjective:    Patient ID: Samuel Santiago, male    DOB: Feb 11, 1929, 78 y.o.   MRN: [id number]  HPI The patient presents for a follow-up of  chronic hypertension, chronic dyslipidemia,  controlled with medicines, elev MCV. C/o insomnia. C/o stress w/wife - she had some heart issues... He will come in 6 mo for wellness (declined earlier visit /labs).  BP Readings from Last 3 Encounters:  07/26/12 120/72  03/24/12 150/80  03/03/12 117/72    Wt Readings from Last 3 Encounters:  07/26/12 175 lb (79.379 kg)  03/24/12 178 lb (80.74 kg)  03/03/12 176 lb (79.833 kg)      Review of Systems  Constitutional: Negative for appetite change, fatigue and unexpected weight change.  HENT: Negative for nosebleeds, congestion, sore throat, sneezing, trouble swallowing and neck pain.   Eyes: Negative for itching and visual disturbance.  Respiratory: Negative for cough.   Cardiovascular: Negative for chest pain, palpitations and leg swelling.  Gastrointestinal: Negative for nausea, diarrhea, blood in stool and abdominal distention.  Genitourinary: Negative for frequency and hematuria.  Musculoskeletal: Negative for back pain, joint swelling and gait problem.  Skin: Negative for rash.  Neurological: Negative for dizziness, tremors, speech difficulty and weakness.  Psychiatric/Behavioral: Negative for disturbed wake/sleep cycle, dysphoric mood and agitation. The patient is not nervous/anxious.        Objective:   Physical Exam  Constitutional: He is oriented to person, place, and time. He appears well-developed.  HENT:  Mouth/Throat: Oropharynx is clear and moist.  Eyes: Conjunctivae normal are normal. Pupils are equal, round, and reactive to light.  Neck: Normal range of motion. No JVD present. No thyromegaly present.  Cardiovascular: Normal rate, regular rhythm, normal heart sounds and intact distal pulses.  Exam reveals no gallop and no friction rub.   No murmur heard. Pulmonary/Chest:  Effort normal and breath sounds normal. No respiratory distress. He has no wheezes. He has no rales. He exhibits no tenderness.  Abdominal: Soft. Bowel sounds are normal. He exhibits no distension and no mass. There is no tenderness. There is no rebound and no guarding.  Musculoskeletal: Normal range of motion. He exhibits no edema and no tenderness.  Lymphadenopathy:    He has no cervical adenopathy.  Neurological: He is alert and oriented to person, place, and time. He has normal reflexes. No cranial nerve deficit. He exhibits normal muscle tone. Coordination normal.  Skin: Skin is warm and dry. No rash noted.  Psychiatric: He has a normal mood and affect. His behavior is normal. Judgment and thought content normal.  Ataxic     Lab Results  Component Value Date   WBC 6.9 05/26/2011   HGB 14.8 05/26/2011   HCT 43.6 05/26/2011   PLT 144.0 Repeated and verified X2.* 05/26/2011   GLUCOSE 99 12/18/2011   CHOL 159 12/18/2011   TRIG 161.0* 12/18/2011   HDL 44.20 12/18/2011   LDLCALC 83 12/18/2011   ALT 28 12/18/2011   AST 32 12/18/2011   NA 137 12/18/2011   K 3.8 12/18/2011   CL 101 12/18/2011   CREATININE 1.1 12/18/2011   BUN 23 12/18/2011   CO2 28 12/18/2011   TSH 0.74 11/28/2010   PSA 1.22 11/28/2010           Assessment & Plan:

## 2012-07-26 NOTE — Assessment & Plan Note (Signed)
10/13 wife had hear issues

## 2012-07-26 NOTE — Assessment & Plan Note (Signed)
Better  

## 2012-10-31 ENCOUNTER — Emergency Department (HOSPITAL_COMMUNITY)
Admission: EM | Admit: 2012-10-31 | Discharge: 2012-10-31 | Disposition: A | Discharge status: Home / Self Care | Payer: Medicare Other | Attending: Emergency Medicine | Admitting: Emergency Medicine

## 2012-10-31 ENCOUNTER — Encounter (HOSPITAL_COMMUNITY): Payer: Self-pay | Admitting: *Deleted

## 2012-10-31 ENCOUNTER — Emergency Department (HOSPITAL_COMMUNITY): Payer: Medicare Other

## 2012-10-31 DIAGNOSIS — H532 Diplopia: Secondary | ICD-10-CM

## 2012-10-31 DIAGNOSIS — Z8679 Personal history of other diseases of the circulatory system: Secondary | ICD-10-CM | POA: Insufficient documentation

## 2012-10-31 DIAGNOSIS — Z860100 Personal history of colon polyps, unspecified: Secondary | ICD-10-CM | POA: Insufficient documentation

## 2012-10-31 DIAGNOSIS — I10 Essential (primary) hypertension: Secondary | ICD-10-CM | POA: Insufficient documentation

## 2012-10-31 DIAGNOSIS — E785 Hyperlipidemia, unspecified: Secondary | ICD-10-CM | POA: Insufficient documentation

## 2012-10-31 DIAGNOSIS — M129 Arthropathy, unspecified: Secondary | ICD-10-CM | POA: Insufficient documentation

## 2012-10-31 DIAGNOSIS — Z9889 Other specified postprocedural states: Secondary | ICD-10-CM | POA: Insufficient documentation

## 2012-10-31 DIAGNOSIS — Z85038 Personal history of other malignant neoplasm of large intestine: Secondary | ICD-10-CM | POA: Insufficient documentation

## 2012-10-31 DIAGNOSIS — R51 Headache: Secondary | ICD-10-CM | POA: Insufficient documentation

## 2012-10-31 DIAGNOSIS — Z87442 Personal history of urinary calculi: Secondary | ICD-10-CM | POA: Insufficient documentation

## 2012-10-31 DIAGNOSIS — Z7982 Long term (current) use of aspirin: Secondary | ICD-10-CM | POA: Insufficient documentation

## 2012-10-31 DIAGNOSIS — K219 Gastro-esophageal reflux disease without esophagitis: Secondary | ICD-10-CM | POA: Insufficient documentation

## 2012-10-31 DIAGNOSIS — H5330 Unspecified disorder of binocular vision: Secondary | ICD-10-CM | POA: Insufficient documentation

## 2012-10-31 DIAGNOSIS — Z87891 Personal history of nicotine dependence: Secondary | ICD-10-CM | POA: Insufficient documentation

## 2012-10-31 MED ORDER — TETRACAINE HCL 0.5 % OP SOLN
2.0000 [drp] | Freq: Once | OPHTHALMIC | Status: AC
  Administered 2012-10-31: 2 [drp] via OPHTHALMIC
  Filled 2012-10-31: qty 2

## 2012-10-31 MED ORDER — FLUORESCEIN SODIUM 1 MG OP STRP
1.0000 | ORAL_STRIP | Freq: Once | OPHTHALMIC | Status: AC
  Administered 2012-10-31: 1 via OPHTHALMIC
  Filled 2012-10-31: qty 1

## 2012-10-31 NOTE — ED Notes (Signed)
Discharge inst discussed with patient, wife and daughter.  All voiced understanding.

## 2012-10-31 NOTE — ED Notes (Addendum)
C/o L eye pain and L eye double vision, sudden onset at 1430, seen at [facility] urgent [facility], instructed to come to  ED, denies pain at this time, vision is double at this time, wears glasses, conjunctiva is bright red and prominent bilaterally normally (for years).  Denies other sx or deficits. H/o L eye surgery ("but, they could never fix it, so I stopped going"). A&Ox4, PERRL 2mm, no droop or drift, MAEx4, grip strength and coordination are equal and strong, no gaze defecit noted, periferal vision intact in all fields OU. Alert, NAD, calm, interactive, skin W&D, resps e/u, speaking in clear complete sentences. Denies HA.

## 2012-10-31 NOTE — ED Provider Notes (Signed)
History     CSN: [id number]  Arrival date & time 10/31/12  1904   None     Chief Complaint  Patient presents with  . Blurred Vision  . Eye Pain   HPI chief complaint: Double vision. Onset: Several hours ago. Location: Both eyes. Resolved with closing 1 eye or the other. Severity: Mild. Timing: Constant. Duration: Several hours. Context: No history of falls or injuries. No recent ocular surgeries. For signs and symptoms to review of systems. For social history see nurse's notes. Family history see below. I reviewed the patient's past medical, past surgical, social history as well as medications and allergies per  Past Medical History  Diagnosis Date  . Hyperlipidemia   . Colon cancer 1990    Had recurrence in 2003 resultinbg in chemotherapy. He recently had a colonoscopy and  had a polyp removed that was benign.  . Hypertension     probably related to lack of morning medications and stress of being at most at night in hospital with his wife  . Ischemic heart disease     has had remote MI in 1997 and was treated with TPA. Prior PCI to RCA with repeat PCI to the RCA in 2003  . Kidney stone     x11  . Normal nuclear stress test Feb 2012    No significant ischemia. EF 56%; with basal inferior scar  . History of nephrolithiasis 01/23/2012  . Arthritis   . History of colon polyps   . GERD (gastroesophageal reflux disease)     Past Surgical History  Procedure Date  . Colonoscopy     colon cancer  . Coronary stent placement 1997 & 2003    PCI to RCA x 2  . Colon surgery     x 2 for cancer  . Eye surgery     Family History  Problem Relation Age of Onset  . Arthritis Sister   . Breast cancer Sister   . Colon polyps Son   . Colon polyps Daughter     History  Substance Use Topics  . Smoking status: Former Smoker    Quit date: 10/20/1985  . Smokeless tobacco: Never Used  . Alcohol Use: No      Review of Systems  Constitutional: Negative for fever and chills.  HENT:  Negative for hearing loss, ear pain, congestion, facial swelling, rhinorrhea, trouble swallowing, neck pain, neck stiffness, sinus pressure and ear discharge.   Eyes: Positive for visual disturbance. Negative for photophobia, pain, discharge, redness and itching.       Of hours earlier of blurry vision now resolved. Patient complains of double vision at this time.  Respiratory: Negative for cough, chest tightness and shortness of breath.   Cardiovascular: Negative for chest pain, palpitations and leg swelling.  Gastrointestinal: Negative for nausea, vomiting, abdominal pain, diarrhea, constipation, blood in stool and abdominal distention.  Genitourinary: Negative for dysuria, urgency, hematuria and difficulty urinating.  Musculoskeletal: Negative for back pain and gait problem.  Skin: Negative for rash.  Neurological: Positive for headaches. Negative for dizziness, tremors, seizures, syncope, facial asymmetry, speech difficulty, weakness, light-headedness and numbness.       Headache now resolved.  Hematological: Negative for adenopathy. Does not bruise/bleed easily.  Psychiatric/Behavioral: Negative for confusion.    Allergies  Review of patient's allergies indicates no known allergies.  Home Medications   Current Outpatient Rx  Name  Route  Sig  Dispense  Refill  . ASPIRIN 325 MG PO TABS   Oral  Take 325 mg by mouth daily.           Marland Kitchen BENAZEPRIL HCL 20 MG PO TABS   Oral   Take 1 tablet (20 mg total) by mouth 2 (two) times daily.   180 tablet   3   . VITAMIN D PO   Oral   Take by mouth.           Marland Kitchen HYDROCHLOROTHIAZIDE 25 MG PO TABS   Oral   Take 1 tablet (25 mg total) by mouth daily.   90 tablet   3   . MULTIVITAMIN PO   Oral   Take by mouth.           . OMEPRAZOLE MAGNESIUM 20 MG PO TBEC   Oral   Take 1 tablet (20 mg total) by mouth daily.   90 tablet   3   . PEG-KCL-NACL-NASULF-NA ASC-C 100 G PO SOLR   Oral   Take 1 kit (100 g total) by mouth once.   1  kit   0   . VITAMIN B6 PO   Oral   Take by mouth.           Marland Kitchen SIMVASTATIN 80 MG PO TABS   Oral   Take 1 tablet (80 mg total) by mouth at bedtime.   30 tablet   5   . TRIAZOLAM 0.25 MG PO TABS   Oral   Take 0.125-0.25 mg by mouth at bedtime as needed.           BP 166/98  Pulse 85  Temp 97.7 F (36.5 C) (Oral)  Resp 16  SpO2 99%  Physical Exam  Constitutional: He is oriented to person, place, and time. He appears well-developed and well-nourished. No distress.  HENT:  Head: Normocephalic and atraumatic.  Right Ear: Hearing, tympanic membrane, external ear and ear canal normal.  Left Ear: Hearing, tympanic membrane, external ear and ear canal normal.  Nose: Nose normal.  Mouth/Throat: Uvula is midline, oropharynx is clear and moist and mucous membranes are normal.  Eyes: Conjunctivae normal and EOM are normal. Pupils are equal, round, and reactive to light. Right eye exhibits no discharge. Left eye exhibits no discharge. No scleral icterus.       Chronic eyelid inflammation and irritation which patient states is at baseline and not bothering him. I am unable to view the patient's fundi on funduscopic exam. Vision is 20/25 on the right and 20/40 on the left. With alternating eye cover the patient has no double vision on exam. Tono-Pen measurements of the right eye were averaged sixteen over three measurements. Tono-Pen measurements over the left eye were measured seventeen averaged over three measurements. Eyes were stained bilaterally with fluorescein strip with no noted corneal uptake on Woods lamp exam.  Neck: Normal range of motion. Neck supple.  Cardiovascular: Normal rate, regular rhythm, normal heart sounds and intact distal pulses.   No murmur heard. Pulmonary/Chest: Effort normal and breath sounds normal. No respiratory distress.  Abdominal: Soft. Bowel sounds are normal. He exhibits no distension. There is no tenderness.  Musculoskeletal: Normal range of motion. He  exhibits no edema and no tenderness.  Neurological: He is alert and oriented to person, place, and time.  Skin: Skin is warm and dry. He is not diaphoretic.  Psychiatric: He has a normal mood and affect.    ED Course  Procedures (including critical care time)  Labs Reviewed - No data to display Ct Head Wo Contrast  10/31/2012  *  RADIOLOGY REPORT*  Clinical Data: Headache.  Double vision. Eye pain.  CT HEAD WITHOUT CONTRAST  Technique:  Contiguous axial images were obtained from the base of the skull through the vertex without contrast.  Comparison: None.  Findings: There is no acute intracranial hemorrhage, infarction, or mass lesion.  There is extensive periventricular white matter lucency consistent with chronic small vessel ischemic disease. There is diffuse cerebral cortical atrophy with secondary ventricular dilatation.  The atrophy is most marked in the temporal lobes.  No osseous abnormality.  The orbits appear normal.  Paranasal sinuses are clear.  IMPRESSION: No acute abnormality.  Atrophy and extensive chronic small vessel ischemic disease.   Original Report Authenticated By: Francene Boyers, M.D.      1. Binocular vision disorder with diplopia       MDM  Patient is a well-appearing 79 year old male with a remote history of cataract surgery presenting with several hours of binocular diplopia. Patient states he also had transient headache, left eye blurred vision and left eye pain. This is since resolved. Vital stable. Afebrile. No focal neurologic deficits. No abnormalities noted by CT imaging of the head with attention to the orbits nor are there any abnormalities with exam testing. Patient does have binocular diplopia only on exam. Clinical picture not consistent with acute stroke, hypertension related symptoms, multiple sclerosis, botulism, intracranial mass, brainstem infarct, orbital cellulitis, orbital fracture, extraocular muscle entrapment, venous sinus thrombosis, lens  dislocation, ocular trauma. Limited funduscopic exam was obtained but no obvious abnormalities noted to the posterior eye. Patient instructed to return to the emergency department should he develop other symptoms or should his symptoms worsen and followup with ophthalmology early this week.        Consuello Masse, MD 10/31/12 [phone]

## 2012-11-03 NOTE — ED Provider Notes (Signed)
I saw and evaluated the patient, reviewed the resident's note and I agree with the findings and plan.  Pt with binocular diplopia. Horizontal. No eye pain. Doesn't wear contacts. No HA. Hasn't seemed to notice if worse when looking in any particular direction. On exam inflammation of b/l lower eye lids which per pt is chronic and w/o acute change. Periorbital tissues otherwise unremarkable. PERRL. EOMI. Visual fields intact to confrontation. Optic discs sharp.  IOP and flourescein per Dr March Rummage. CN2-12 intact. Good finger to nose testing b/l. Ct head normal. Neuro exam nonfocal. optho fu.  Raeford Razor, MD 11/03/12 [phone]

## 2013-01-24 ENCOUNTER — Ambulatory Visit (INDEPENDENT_AMBULATORY_CARE_PROVIDER_SITE_OTHER): Payer: Medicare Other | Admitting: Internal Medicine

## 2013-01-24 ENCOUNTER — Encounter: Payer: Self-pay | Admitting: Internal Medicine

## 2013-01-24 VITALS — BP 128/68 | HR 72 | Temp 97.3°F | Resp 16 | Wt 174.0 lb

## 2013-01-24 DIAGNOSIS — F439 Reaction to severe stress, unspecified: Secondary | ICD-10-CM

## 2013-01-24 DIAGNOSIS — Z733 Stress, not elsewhere classified: Secondary | ICD-10-CM

## 2013-01-24 DIAGNOSIS — I2510 Atherosclerotic heart disease of native coronary artery without angina pectoris: Secondary | ICD-10-CM

## 2013-01-24 DIAGNOSIS — I10 Essential (primary) hypertension: Secondary | ICD-10-CM

## 2013-01-24 DIAGNOSIS — E785 Hyperlipidemia, unspecified: Secondary | ICD-10-CM

## 2013-01-24 NOTE — Assessment & Plan Note (Signed)
Continue with current prescription therapy as reflected on the Med list.  

## 2013-01-24 NOTE — Assessment & Plan Note (Signed)
Better  

## 2013-01-24 NOTE — Progress Notes (Signed)
   Subjective:    HPI The patient presents for a follow-up of  chronic hypertension, chronic dyslipidemia,  controlled with medicines, elev MCV. C/o insomnia. F/u stress w/wife - she had some heart issues - better... He will come in 6 mo for wellness (declined earlier visit /labs).  BP Readings from Last 3 Encounters:  01/24/13 128/68  10/31/12 136/68  07/26/12 120/72    Wt Readings from Last 3 Encounters:  01/24/13 174 lb (78.926 kg)  07/26/12 175 lb (79.379 kg)  03/24/12 178 lb (80.74 kg)      Review of Systems  Constitutional: Negative for appetite change, fatigue and unexpected weight change.  HENT: Negative for nosebleeds, congestion, sore throat, sneezing, trouble swallowing and neck pain.   Eyes: Negative for itching and visual disturbance.  Respiratory: Negative for cough.   Cardiovascular: Negative for chest pain, palpitations and leg swelling.  Gastrointestinal: Negative for nausea, diarrhea, blood in stool and abdominal distention.  Genitourinary: Negative for frequency and hematuria.  Musculoskeletal: Negative for back pain, joint swelling and gait problem.  Skin: Negative for rash.  Neurological: Negative for dizziness, tremors, speech difficulty and weakness.  Psychiatric/Behavioral: Negative for sleep disturbance, dysphoric mood and agitation. The patient is not nervous/anxious.        Objective:   Physical Exam  Constitutional: He is oriented to person, place, and time. He appears well-developed.  HENT:  Mouth/Throat: Oropharynx is clear and moist.  Eyes: Conjunctivae are normal. Pupils are equal, round, and reactive to light.  Neck: Normal range of motion. No JVD present. No thyromegaly present.  Cardiovascular: Normal rate, regular rhythm, normal heart sounds and intact distal pulses.  Exam reveals no gallop and no friction rub.   No murmur heard. Pulmonary/Chest: Effort normal and breath sounds normal. No respiratory distress. He has no wheezes. He has  no rales. He exhibits no tenderness.  Abdominal: Soft. Bowel sounds are normal. He exhibits no distension and no mass. There is no tenderness. There is no rebound and no guarding.  Musculoskeletal: Normal range of motion. He exhibits no edema and no tenderness.  Lymphadenopathy:    He has no cervical adenopathy.  Neurological: He is alert and oriented to person, place, and time. He has normal reflexes. No cranial nerve deficit. He exhibits normal muscle tone. Coordination normal.  Skin: Skin is warm and dry. No rash noted.  Psychiatric: He has a normal mood and affect. His behavior is normal. Judgment and thought content normal.  Ataxic     Lab Results  Component Value Date   WBC 6.9 05/26/2011   HGB 14.8 05/26/2011   HCT 43.6 05/26/2011   PLT 144.0 Repeated and verified X2.* 05/26/2011   GLUCOSE 99 12/18/2011   CHOL 159 12/18/2011   TRIG 161.0* 12/18/2011   HDL 44.20 12/18/2011   LDLCALC 83 12/18/2011   ALT 28 12/18/2011   AST 32 12/18/2011   NA 137 12/18/2011   K 3.8 12/18/2011   CL 101 12/18/2011   CREATININE 1.1 12/18/2011   BUN 23 12/18/2011   CO2 28 12/18/2011   TSH 0.74 11/28/2010   PSA 1.22 11/28/2010           Assessment & Plan:

## 2013-01-24 NOTE — Patient Instructions (Addendum)
Come back for labs fasting please

## 2013-01-27 ENCOUNTER — Other Ambulatory Visit (INDEPENDENT_AMBULATORY_CARE_PROVIDER_SITE_OTHER): Payer: Medicare Other

## 2013-01-27 DIAGNOSIS — R279 Unspecified lack of coordination: Secondary | ICD-10-CM

## 2013-01-27 DIAGNOSIS — R270 Ataxia, unspecified: Secondary | ICD-10-CM

## 2013-01-27 DIAGNOSIS — I2510 Atherosclerotic heart disease of native coronary artery without angina pectoris: Secondary | ICD-10-CM

## 2013-01-27 DIAGNOSIS — Z733 Stress, not elsewhere classified: Secondary | ICD-10-CM

## 2013-01-27 DIAGNOSIS — D7589 Other specified diseases of blood and blood-forming organs: Secondary | ICD-10-CM

## 2013-01-27 DIAGNOSIS — H01009 Unspecified blepharitis unspecified eye, unspecified eyelid: Secondary | ICD-10-CM

## 2013-01-27 DIAGNOSIS — I10 Essential (primary) hypertension: Secondary | ICD-10-CM

## 2013-01-27 DIAGNOSIS — N320 Bladder-neck obstruction: Secondary | ICD-10-CM

## 2013-01-27 DIAGNOSIS — F439 Reaction to severe stress, unspecified: Secondary | ICD-10-CM

## 2013-01-27 LAB — BASIC METABOLIC PANEL
BUN: 25 mg/dL — ABNORMAL HIGH (ref 6–23)
Chloride: 102 mEq/L (ref 96–112)
Potassium: 4.6 mEq/L (ref 3.5–5.1)

## 2013-01-27 LAB — HEPATIC FUNCTION PANEL
ALT: 26 U/L (ref 0–53)
AST: 21 U/L (ref 0–37)
Total Bilirubin: 1.2 mg/dL (ref 0.3–1.2)
Total Protein: 6.8 g/dL (ref 6.0–8.3)

## 2013-01-27 LAB — LIPID PANEL
Cholesterol: 178 mg/dL (ref 0–200)
HDL: 33.7 mg/dL — ABNORMAL LOW (ref >39.00)
Triglycerides: 155 mg/dL — ABNORMAL HIGH (ref 0.0–149.0)
VLDL: 31 mg/dL (ref 0.0–40.0)

## 2013-01-27 LAB — CBC WITH DIFFERENTIAL/PLATELET
Basophils Relative: 0.6 % (ref 0.0–3.0)
Eosinophils Relative: 4.6 % (ref 0.0–5.0)
HCT: 44.4 % (ref 39.0–52.0)
Hemoglobin: 15.3 g/dL (ref 13.0–17.0)
Lymphs Abs: 1.4 10*3/uL (ref 0.7–4.0)
MCV: 103.3 fl — ABNORMAL HIGH (ref 78.0–100.0)
Monocytes Absolute: 0.8 10*3/uL (ref 0.1–1.0)
Neutro Abs: 4 10*3/uL (ref 1.4–7.7)
RBC: 4.3 Mil/uL (ref 4.22–5.81)
WBC: 6.6 10*3/uL (ref 4.5–10.5)

## 2013-01-27 LAB — PSA: PSA: 1.12 ng/mL (ref 0.10–4.00)

## 2013-01-27 LAB — URINALYSIS
Bilirubin Urine: NEGATIVE
Nitrite: NEGATIVE
Total Protein, Urine: NEGATIVE
pH: 6 (ref 5.0–8.0)

## 2013-01-27 LAB — SEDIMENTATION RATE: Sed Rate: 9 mm/hr (ref 0–22)

## 2013-01-27 LAB — TSH: TSH: 0.92 u[IU]/mL (ref 0.35–5.50)

## 2013-01-31 LAB — PROTEIN ELECTROPHORESIS, SERUM
Alpha-2-Globulin: 10.4 % (ref 7.1–11.8)
Gamma Globulin: 17.5 % (ref 11.1–18.8)

## 2013-02-01 ENCOUNTER — Other Ambulatory Visit: Payer: Self-pay | Admitting: *Deleted

## 2013-02-01 MED ORDER — SIMVASTATIN 80 MG PO TABS: 80.0000 mg | ORAL_TABLET | Freq: Every day | ORAL | Status: DC

## 2013-04-02 ENCOUNTER — Other Ambulatory Visit: Payer: Self-pay | Admitting: Internal Medicine

## 2013-05-03 ENCOUNTER — Other Ambulatory Visit: Payer: Self-pay | Admitting: Internal Medicine

## 2013-05-27 ENCOUNTER — Emergency Department (HOSPITAL_COMMUNITY): Payer: Medicare Other

## 2013-05-27 ENCOUNTER — Emergency Department (HOSPITAL_COMMUNITY)
Admission: EM | Admit: 2013-05-27 | Discharge: 2013-05-28 | Disposition: A | Discharge status: Home / Self Care | Payer: Medicare Other | Attending: Emergency Medicine | Admitting: Emergency Medicine

## 2013-05-27 ENCOUNTER — Encounter (HOSPITAL_COMMUNITY): Payer: Self-pay | Admitting: *Deleted

## 2013-05-27 DIAGNOSIS — Z860100 Personal history of colon polyps, unspecified: Secondary | ICD-10-CM | POA: Insufficient documentation

## 2013-05-27 DIAGNOSIS — R112 Nausea with vomiting, unspecified: Secondary | ICD-10-CM | POA: Insufficient documentation

## 2013-05-27 DIAGNOSIS — M129 Arthropathy, unspecified: Secondary | ICD-10-CM | POA: Insufficient documentation

## 2013-05-27 DIAGNOSIS — Z9889 Other specified postprocedural states: Secondary | ICD-10-CM | POA: Insufficient documentation

## 2013-05-27 DIAGNOSIS — N23 Unspecified renal colic: Secondary | ICD-10-CM | POA: Insufficient documentation

## 2013-05-27 DIAGNOSIS — Z87891 Personal history of nicotine dependence: Secondary | ICD-10-CM | POA: Insufficient documentation

## 2013-05-27 DIAGNOSIS — Z7982 Long term (current) use of aspirin: Secondary | ICD-10-CM | POA: Insufficient documentation

## 2013-05-27 DIAGNOSIS — Z87442 Personal history of urinary calculi: Secondary | ICD-10-CM | POA: Insufficient documentation

## 2013-05-27 DIAGNOSIS — E785 Hyperlipidemia, unspecified: Secondary | ICD-10-CM | POA: Insufficient documentation

## 2013-05-27 DIAGNOSIS — K219 Gastro-esophageal reflux disease without esophagitis: Secondary | ICD-10-CM | POA: Insufficient documentation

## 2013-05-27 DIAGNOSIS — Z85038 Personal history of other malignant neoplasm of large intestine: Secondary | ICD-10-CM | POA: Insufficient documentation

## 2013-05-27 DIAGNOSIS — Z8679 Personal history of other diseases of the circulatory system: Secondary | ICD-10-CM | POA: Insufficient documentation

## 2013-05-27 DIAGNOSIS — Z9861 Coronary angioplasty status: Secondary | ICD-10-CM | POA: Insufficient documentation

## 2013-05-27 DIAGNOSIS — I10 Essential (primary) hypertension: Secondary | ICD-10-CM | POA: Insufficient documentation

## 2013-05-27 DIAGNOSIS — Z79899 Other long term (current) drug therapy: Secondary | ICD-10-CM | POA: Insufficient documentation

## 2013-05-27 LAB — CBC WITH DIFFERENTIAL/PLATELET
Basophils Relative: 0 % (ref 0–1)
Eosinophils Absolute: 0.3 10*3/uL (ref 0.0–0.7)
HCT: 43 % (ref 39.0–52.0)
Hemoglobin: 15.7 g/dL (ref 13.0–17.0)
MCH: 36.3 pg — ABNORMAL HIGH (ref 26.0–34.0)
MCHC: 36.5 g/dL — ABNORMAL HIGH (ref 30.0–36.0)
Monocytes Absolute: 1.1 10*3/uL — ABNORMAL HIGH (ref 0.1–1.0)
Monocytes Relative: 9 % (ref 3–12)

## 2013-05-27 LAB — URINALYSIS, ROUTINE W REFLEX MICROSCOPIC
Ketones, ur: NEGATIVE mg/dL
Leukocytes, UA: NEGATIVE
Nitrite: NEGATIVE
Specific Gravity, Urine: 1.018 (ref 1.005–1.030)
pH: 5 (ref 5.0–8.0)

## 2013-05-27 LAB — COMPREHENSIVE METABOLIC PANEL
Albumin: 3.8 g/dL (ref 3.5–5.2)
BUN: 29 mg/dL — ABNORMAL HIGH (ref 6–23)
Creatinine, Ser: 1.34 mg/dL (ref 0.50–1.35)
Total Protein: 7.5 g/dL (ref 6.0–8.3)

## 2013-05-27 LAB — LIPASE, BLOOD: Lipase: 36 U/L (ref 11–59)

## 2013-05-27 LAB — URINE MICROSCOPIC-ADD ON

## 2013-05-27 MED ORDER — HYDROCODONE-ACETAMINOPHEN 5-325 MG PO TABS: 1.0000 | ORAL_TABLET | ORAL | Status: DC | PRN

## 2013-05-27 MED ORDER — ONDANSETRON HCL 4 MG/2ML IJ SOLN
4.0000 mg | Freq: Once | INTRAMUSCULAR | Status: AC
  Administered 2013-05-27: 4 mg via INTRAVENOUS
  Filled 2013-05-27: qty 2

## 2013-05-27 MED ORDER — KETOROLAC TROMETHAMINE 30 MG/ML IJ SOLN
30.0000 mg | Freq: Once | INTRAMUSCULAR | Status: AC
  Administered 2013-05-27: 30 mg via INTRAVENOUS
  Filled 2013-05-27: qty 1

## 2013-05-27 MED ORDER — FENTANYL CITRATE 0.05 MG/ML IJ SOLN
50.0000 ug | Freq: Once | INTRAMUSCULAR | Status: AC
  Administered 2013-05-27: 50 ug via INTRAVENOUS

## 2013-05-27 MED ORDER — TAMSULOSIN HCL 0.4 MG PO CAPS: 0.4000 mg | ORAL_CAPSULE | Freq: Every day | ORAL | Status: DC

## 2013-05-27 MED ORDER — FENTANYL CITRATE 0.05 MG/ML IJ SOLN
50.0000 ug | Freq: Once | INTRAMUSCULAR | Status: AC
  Administered 2013-05-27: 50 ug via INTRAVENOUS
  Filled 2013-05-27: qty 2

## 2013-05-27 NOTE — ED Notes (Signed)
2010  Pt arrives to the room via wheelchair with possible kidney stone.  Pt states the pain started in his lower right abdominal area and the pain is now in the lower back area.  Rates the pain 6/10.  Pt has family at bedside and pt is awaiting to see the DR.  Pt has taken 1000mg  of tylenol prior to arrival

## 2013-05-27 NOTE — ED Provider Notes (Signed)
CSN: [id number]     Arrival date & time 05/27/13  1920 History     First MD Initiated Contact with Patient 05/27/13 2015     Chief Complaint  Patient presents with  . Flank Pain   (Consider location/radiation/quality/duration/timing/severity/associated sxs/prior Treatment) HPI Complains of right groin pain radiating to right flank onset 4:30 PM today typical of kidney stones he's had in the past last kidney stone was 10 years ago. Patient has vomited twice since the onset of the pain. Nothing makes symptoms better or worse presently pain is 6 on a scale of 1-10, moderate to severe. No other associated symptoms. He treated himself with one beer prior to coming here for the pain. Past Medical History  Diagnosis Date  . Hyperlipidemia   . Colon cancer 1990    Had recurrence in 2003 resultinbg in chemotherapy. He recently had a colonoscopy and  had a polyp removed that was benign.  . Hypertension     probably related to lack of morning medications and stress of being at most at night in hospital with his wife  . Ischemic heart disease     has had remote MI in 1997 and was treated with TPA. Prior PCI to RCA with repeat PCI to the RCA in 2003  . Kidney stone     x11  . Normal nuclear stress test Feb 2012    No significant ischemia. EF 56%; with basal inferior scar  . History of nephrolithiasis 01/23/2012  . Arthritis   . History of colon polyps   . GERD (gastroesophageal reflux disease)    kidney stones Past Surgical History  Procedure Laterality Date  . Colonoscopy      colon cancer  . Coronary stent placement  1997 & 2003    PCI to RCA x 2  . Colon surgery      x 2 for cancer  . Eye surgery     Family History  Problem Relation Age of Onset  . Arthritis Sister   . Breast cancer Sister   . Colon polyps Son   . Colon polyps Daughter    History  Substance Use Topics  . Smoking status: Former Smoker    Quit date: 10/20/1985  . Smokeless tobacco: Never Used  . Alcohol Use: No    occasional alcohol use  Review of Systems  Constitutional: Negative.   HENT: Negative.   Respiratory: Negative.   Cardiovascular: Negative.   Gastrointestinal: Positive for nausea, vomiting and abdominal pain.  Genitourinary: Positive for flank pain.  Musculoskeletal: Negative.   Skin: Negative.   Neurological: Negative.   Psychiatric/Behavioral: Negative.   All other systems reviewed and are negative.    Allergies  Review of patient's allergies indicates no known allergies.  Home Medications   Current Outpatient Rx  Name  Route  Sig  Dispense  Refill  . aspirin 325 MG tablet   Oral   Take 325 mg by mouth daily.           . benazepril (LOTENSIN) 20 MG tablet      TAKE 1 TABLET BY MOUTH TWICE DAILY   180 tablet   2   . Cholecalciferol (VITAMIN D PO)   Oral   Take by mouth.           . hydrochlorothiazide (HYDRODIURIL) 25 MG tablet   Oral   Take 1 tablet (25 mg total) by mouth daily.   90 tablet   3   . Multiple Vitamin (MULTIVITAMIN PO)  Oral   Take by mouth.           Marland Kitchen omeprazole (PRILOSEC) 20 MG capsule   Oral   Take 1 capsule (20 mg total) by mouth daily.   90 capsule   3   . peg 3350 powder (MOVIPREP) 100 G SOLR   Oral   Take 1 kit (100 g total) by mouth once.   1 kit   0   . Pyridoxine HCl (VITAMIN B6 PO)   Oral   Take by mouth.           . simvastatin (ZOCOR) 80 MG tablet   Oral   Take 1 tablet (80 mg total) by mouth at bedtime.   30 tablet   3     Patient needs to schedule follow up appointment   . EXPIRED: triazolam (HALCION) 0.25 MG tablet   Oral   Take 0.125-0.25 mg by mouth at bedtime as needed.          BP 154/89  Pulse 72  Temp(Src) 98 F (36.7 C) (Oral)  Resp 18  SpO2 96% Physical Exam  Nursing note and vitals reviewed. Constitutional: He appears well-developed and well-nourished.  HENT:  Head: Normocephalic and atraumatic.  Eyes: Conjunctivae are normal. Pupils are equal, round, and reactive to light.   Neck: Neck supple. No tracheal deviation present. No thyromegaly present.  Cardiovascular: Normal rate and regular rhythm.   No murmur heard. Pulmonary/Chest: Effort normal and breath sounds normal.  Abdominal: Soft. Bowel sounds are normal. He exhibits no distension. There is no tenderness.  Genitourinary: Penis normal.  Mild right flank tenderness  Musculoskeletal: Normal range of motion. He exhibits no edema and no tenderness.  Neurological: He is alert. Coordination normal.  Skin: Skin is warm and dry. No rash noted.  Psychiatric: He has a normal mood and affect.    ED Course  11:30 PM Pain started after treatment with intravenous opioids and Toradol. Procedures (including critical care time)  Labs Reviewed  CBC WITH DIFFERENTIAL - Abnormal; Notable for the following:    WBC 12.6 (*)    MCH 36.3 (*)    MCHC 36.5 (*)    Neutro Abs 9.0 (*)    Monocytes Absolute 1.1 (*)    All other components within normal limits  COMPREHENSIVE METABOLIC PANEL  LIPASE, BLOOD  URINALYSIS, ROUTINE W REFLEX MICROSCOPIC   No results found. No diagnosis found. Results for orders placed during the hospital encounter of 05/27/13  CBC WITH DIFFERENTIAL      Result Value Range   WBC 12.6 (*) 4.0 - 10.5 K/uL   RBC 4.33  4.22 - 5.81 MIL/uL   Hemoglobin 15.7  13.0 - 17.0 g/dL   HCT 78.2  95.6 - 21.3 %   MCV 99.3  78.0 - 100.0 fL   MCH 36.3 (*) 26.0 - 34.0 pg   MCHC 36.5 (*) 30.0 - 36.0 g/dL   RDW 08.6  57.8 - 46.9 %   Platelets 155  150 - 400 K/uL   Neutrophils Relative % 72  43 - 77 %   Neutro Abs 9.0 (*) 1.7 - 7.7 K/uL   Lymphocytes Relative 16  12 - 46 %   Lymphs Abs 2.1  0.7 - 4.0 K/uL   Monocytes Relative 9  3 - 12 %   Monocytes Absolute 1.1 (*) 0.1 - 1.0 K/uL   Eosinophils Relative 3  0 - 5 %   Eosinophils Absolute 0.3  0.0 - 0.7 K/uL  Basophils Relative 0  0 - 1 %   Basophils Absolute 0.1  0.0 - 0.1 K/uL  COMPREHENSIVE METABOLIC PANEL      Result Value Range   Sodium 134 (*) 135  - 145 mEq/L   Potassium 4.3  3.5 - 5.1 mEq/L   Chloride 100  96 - 112 mEq/L   CO2 23  19 - 32 mEq/L   Glucose, Bld 132 (*) 70 - 99 mg/dL   BUN 29 (*) 6 - 23 mg/dL   Creatinine, Ser 1.61  0.50 - 1.35 mg/dL   Calcium 9.8  8.4 - 09.6 mg/dL   Total Protein 7.5  6.0 - 8.3 g/dL   Albumin 3.8  3.5 - 5.2 g/dL   AST 30  0 - 37 U/L   ALT 23  0 - 53 U/L   Alkaline Phosphatase 60  39 - 117 U/L   Total Bilirubin 0.4  0.3 - 1.2 mg/dL   GFR calc non Af Amer 48 (*) >90 mL/min   GFR calc Af Amer 56 (*) >90 mL/min  LIPASE, BLOOD      Result Value Range   Lipase 36  11 - 59 U/L  URINALYSIS, ROUTINE W REFLEX MICROSCOPIC      Result Value Range   Color, Urine YELLOW  YELLOW   APPearance HAZY (*) CLEAR   Specific Gravity, Urine 1.018  1.005 - 1.030   pH 5.0  5.0 - 8.0   Glucose, UA NEGATIVE  NEGATIVE mg/dL   Hgb urine dipstick LARGE (*) NEGATIVE   Bilirubin Urine NEGATIVE  NEGATIVE   Ketones, ur NEGATIVE  NEGATIVE mg/dL   Protein, ur NEGATIVE  NEGATIVE mg/dL   Urobilinogen, UA 0.2  0.0 - 1.0 mg/dL   Nitrite NEGATIVE  NEGATIVE   Leukocytes, UA NEGATIVE  NEGATIVE  URINE MICROSCOPIC-ADD ON      Result Value Range   Squamous Epithelial / LPF FEW (*) RARE   WBC, UA 0-2  <3 WBC/hpf   RBC / HPF TOO NUMEROUS TO COUNT  <3 RBC/hpf   Urine-Other MUCOUS PRESENT     Ct Abdomen Pelvis Wo Contrast  05/27/2013   *RADIOLOGY REPORT*  Clinical Data: Right flank and right lower quadrant abdominal pain. History of kidney stones.  CT ABDOMEN AND PELVIS WITHOUT CONTRAST  Technique:  Multidetector CT imaging of the abdomen and pelvis was performed following the standard protocol without intravenous contrast.  Comparison: None.  Findings: Mild atelectasis is present at both lung bases.  There is no significant pleural or pericardial effusion.  A moderate sized hiatal hernia is noted.  There are coronary artery calcifications.  Both kidneys are distorted by multiple large renal cysts bilaterally.  These measure up to 15.8  cm on the right (image 28) and 9.2 cm on the left (image 32).  Within the proximal right ureter is a 3 mm calculus on image 47. There is only mild dilatation of the right renal pelvis.  No significant perinephric soft tissue stranding is identified.  There are no definite renal calculi.  The liver, spleen, adrenal glands and pancreas appear normal aside from mild pancreatic atrophy.  There is a 1.6 cm calcified gallstone.  No gallbladder wall thickening or significant biliary dilatation is seen.  There are surgical clips in the right abdomen consistent with the partial colectomy.  Other clips are present in the pelvis consistent with distal colon resection and anastomoses.  The transverse colon is distended and stool-filled.  No inflammatory changes or enlarged lymph nodes  are seen.  There is mild aorto iliac atherosclerosis.  The prostate gland is moderately enlarged.  There are no acute osseous findings.  IMPRESSION:  1.  Partially obstructing 3 mm calculus in the proximal right ureter. 2.  Multiple large renal cysts bilaterally. 3.  Calcified gallstone. 4.  Moderate colonic dilatation status post partial colon resection.  No evidence of bowel obstruction.   Original Report Authenticated By: Carey Bullocks, M.D.    MDM  Plan prescriptions for Darcella Cheshire, ibuprofen referral [facility] Diagnosis ureteral colic  Doug Sou, MD 05/27/13 [phone]

## 2013-05-27 NOTE — ED Notes (Signed)
The pt has pain in his rt flank and in his rt lower abd with n v.  Hx of kidney stones

## 2013-06-29 ENCOUNTER — Encounter: Payer: Self-pay | Admitting: Nurse Practitioner

## 2013-06-29 ENCOUNTER — Ambulatory Visit (INDEPENDENT_AMBULATORY_CARE_PROVIDER_SITE_OTHER): Payer: Medicare Other | Admitting: Nurse Practitioner

## 2013-06-29 VITALS — BP 140/80 | HR 68 | Ht 66.0 in | Wt 175.1 lb

## 2013-06-29 DIAGNOSIS — I2510 Atherosclerotic heart disease of native coronary artery without angina pectoris: Secondary | ICD-10-CM

## 2013-06-29 NOTE — Progress Notes (Signed)
Samuel Santiago [id number] Date of Birth: 05-09-1929 Medical Record #[id number]  History of Present Illness: Samuel Santiago is seen back today for a follow up visit. Seen for Dr. Shirlee Latch - former patient of Dr. Ronnald Nian. Has known CAD with remote MI treated with thrombolysis in 1997, followed by 2 stents to the RCA. Last Myoview from 2012 was normal. Other issues include colon cancer in 1990 with colectomy and recurrence in 2003 treated with chemo, HTN, HLD and nephrolithiasis.   Last seen here 1 1/2 years ago - looks like he was doing well.  Comes back today. Here alone. No chest pain. Not short of breath. Walking about a mile per day. Trying to eat a little better. Has lost a few pounds. Not palpitations. Not dizzy or passing out. He is basically up to date on his labs. He had a kidney stone last month and ended up going to the ER.    Current Outpatient Prescriptions  Medication Sig Dispense Refill  . aspirin EC 325 MG tablet Take 325 mg by mouth daily.      . benazepril (LOTENSIN) 20 MG tablet Take 20 mg by mouth 2 (two) times daily.      . Cholecalciferol (VITAMIN D PO) Take 1 tablet by mouth daily.       . hydrochlorothiazide (HYDRODIURIL) 25 MG tablet Take 1 tablet (25 mg total) by mouth daily.  90 tablet  3  . Multiple Vitamin (MULTIVITAMIN WITH MINERALS) TABS tablet Take 1 tablet by mouth daily.      . Omega-3 Fatty Acids (FISH OIL PO) Take 1 capsule by mouth daily.      Marland Kitchen omeprazole (PRILOSEC) 20 MG capsule Take 1 capsule (20 mg total) by mouth daily.  90 capsule  3  . simvastatin (ZOCOR) 80 MG tablet Take 80 mg by mouth 3 (three) times a week. Does not take on any specific day      . tamsulosin (FLOMAX) 0.4 MG CAPS capsule Take 1 capsule (0.4 mg total) by mouth daily.  10 capsule  0  . [DISCONTINUED] omeprazole (PRILOSEC OTC) 20 MG tablet Take 1 tablet (20 mg total) by mouth daily.  90 tablet  3   No current facility-administered medications for this visit.    No Known Allergies  Past Medical  History  Diagnosis Date  . Hyperlipidemia   . Colon cancer 1990    Had recurrence in 2003 resultinbg in chemotherapy. He recently had a colonoscopy and  had a polyp removed that was benign.  . Hypertension     probably related to lack of morning medications and stress of being at most at night in hospital with his wife  . Ischemic heart disease     has had remote MI in 1997 and was treated with TPA. Prior PCI to RCA with repeat PCI to the RCA in 2003  . Kidney stone     x11  . Normal nuclear stress test Feb 2012    No significant ischemia. EF 56%; with basal inferior scar  . History of nephrolithiasis 01/23/2012  . Arthritis   . History of colon polyps   . GERD (gastroesophageal reflux disease)     Past Surgical History  Procedure Laterality Date  . Colonoscopy      colon cancer  . Coronary stent placement  1997 & 2003    PCI to RCA x 2  . Colon surgery      x 2 for cancer  . Eye surgery  History  Smoking status  . Former Smoker  . Quit date: 10/20/1985  Smokeless tobacco  . Never Used    History  Alcohol Use No    Family History  Problem Relation Age of Onset  . Arthritis Sister   . Breast cancer Sister   . Colon polyps Son   . Colon polyps Daughter     Review of Systems: The review of systems is per the HPI.  All other systems were reviewed and are negative.  Physical Exam: Ht 5\' 6"  (1.676 m)  Wt 175 lb 1.9 oz (79.434 kg)  BMI 28.28 kg/m2 Patient is very pleasant and in no acute distress. Weight down 4 pounds since last visit. Skin is warm and dry. Color is normal.  HEENT is unremarkable. Normocephalic/atraumatic. PERRL. Sclera are nonicteric. Neck is supple. No masses. No JVD. Lungs are clear. Cardiac exam shows an irregular rhythm. Abdomen is soft. Extremities are without edema. Gait and ROM are intact. No gross neurologic deficits noted.  LABORATORY DATA:  Lab Results  Component Value Date   WBC 12.6* 05/27/2013   HGB 15.7 05/27/2013   HCT 43.0  05/27/2013   PLT 155 05/27/2013   GLUCOSE 132* 05/27/2013   CHOL 178 01/27/2013   TRIG 155.0* 01/27/2013   HDL 33.70* 01/27/2013   LDLCALC 113* 01/27/2013   ALT 23 05/27/2013   AST 30 05/27/2013   NA 134* 05/27/2013   K 4.3 05/27/2013   CL 100 05/27/2013   CREATININE 1.34 05/27/2013   BUN 29* 05/27/2013   CO2 23 05/27/2013   TSH 0.92 01/27/2013   PSA 1.12 01/27/2013     Assessment / Plan: 1. CAD - doing well clinically. Will check EKG today - was a little irregular on physical exam - EKG shows sinus with PAC's today. I have left him on his current regimen.   2. HTN - BP is ok - no change with his current regimen  Overall, he is doing well. I will see him back with his wife in February. He sees his PCP in October.   Patient is agreeable to this plan and will call if any problems develop in the interim.   Rosalio Macadamia, RN, ANP-C [facility] [address] [place], [place]  [address]

## 2013-06-29 NOTE — Patient Instructions (Addendum)
Be active!!!!  Stay on your current medicines  I will see you in 6 months  Call the [facility] office at [phone] if you have any questions, problems or concerns.

## 2013-07-12 ENCOUNTER — Other Ambulatory Visit: Payer: Self-pay

## 2013-07-12 MED ORDER — HYDROCHLOROTHIAZIDE 25 MG PO TABS: 25.0000 mg | ORAL_TABLET | Freq: Every day | ORAL | Status: DC

## 2013-07-26 ENCOUNTER — Ambulatory Visit: Payer: Medicare Other | Admitting: Internal Medicine

## 2013-07-26 DIAGNOSIS — Z0289 Encounter for other administrative examinations: Secondary | ICD-10-CM

## 2013-09-02 ENCOUNTER — Telehealth: Payer: Self-pay | Admitting: Internal Medicine

## 2013-09-02 NOTE — Telephone Encounter (Signed)
09/02/2013  Pt's wife called in stating that they received a bill for a no show on 07/26/13 visit.  Pt's wife stated that they were unaware of appt and did not make the appt back on 01/24/2013.  Gave billings # and also informed pt that we would not make an appt without the pt's consent.

## 2013-11-30 ENCOUNTER — Encounter: Payer: Self-pay | Admitting: Nurse Practitioner

## 2013-11-30 ENCOUNTER — Ambulatory Visit (INDEPENDENT_AMBULATORY_CARE_PROVIDER_SITE_OTHER): Payer: Medicare HMO | Admitting: Nurse Practitioner

## 2013-11-30 VITALS — BP 120/80 | HR 67 | Ht 66.0 in | Wt 177.0 lb

## 2013-11-30 DIAGNOSIS — E785 Hyperlipidemia, unspecified: Secondary | ICD-10-CM

## 2013-11-30 DIAGNOSIS — I10 Essential (primary) hypertension: Secondary | ICD-10-CM

## 2013-11-30 DIAGNOSIS — I2510 Atherosclerotic heart disease of native coronary artery without angina pectoris: Secondary | ICD-10-CM

## 2013-11-30 NOTE — Patient Instructions (Signed)
Stay on your current medicines  Stay active  I will see you in 6 months  Call the [place] office at [phone] if you have any questions, problems or concerns.

## 2013-11-30 NOTE — Progress Notes (Signed)
Samuel Santiago [id number] Date of Birth: Jan 04, 1929 Medical Record #[id number]  History of Present Illness: Samuel Santiago is seen back today for a follow up visit. It is a 6 month check.  Seen for Dr. Aundra Dubin - former patient of Dr. Susa Simmonds. Has known CAD with remote MI treated with thrombolysis in 1997, followed by 2 stents to the RCA. Last Myoview from 2012 was normal. Other issues include colon cancer in 1990 with colectomy and recurrence in 2003 treated with chemo, HTN, HLD and nephrolithiasis.   Last seen here back in September of 2014 - looks like he was doing well.   Comes back today. Here with his wife. No chest pain. Walking twice a day. Feels good. Has had another kidney stone since his last visit - he has had about 15 in his lifetime. BP is good. No recent labs but not fasting today. Sees his PCP later this month with fasting labs.   Current Outpatient Prescriptions  Medication Sig Dispense Refill  . aspirin EC 325 MG tablet Take 325 mg by mouth daily.      . benazepril (LOTENSIN) 20 MG tablet Take 20 mg by mouth 2 (two) times daily.      . Cholecalciferol (VITAMIN D-3 PO) Take by mouth daily.      . hydrochlorothiazide (HYDRODIURIL) 25 MG tablet Take 1 tablet (25 mg total) by mouth daily.  90 tablet  3  . Multiple Vitamin (MULTIVITAMIN WITH MINERALS) TABS tablet Take 1 tablet by mouth daily.      . Omega-3 Fatty Acids (FISH OIL PO) Take 1 capsule by mouth daily.      Marland Kitchen omeprazole (PRILOSEC) 20 MG capsule Take 1 capsule (20 mg total) by mouth daily.  90 capsule  3  . simvastatin (ZOCOR) 80 MG tablet Take 80 mg by mouth 3 (three) times a week. Does not take on any specific day      . [DISCONTINUED] omeprazole (PRILOSEC OTC) 20 MG tablet Take 1 tablet (20 mg total) by mouth daily.  90 tablet  3   No current facility-administered medications for this visit.    No Known Allergies  Past Medical History  Diagnosis Date  . Hyperlipidemia   . Colon cancer 1990    Had recurrence in 2003  resultinbg in chemotherapy. He recently had a colonoscopy and  had a polyp removed that was benign.  . Hypertension     probably related to lack of morning medications and stress of being at most at night in hospital with his wife  . Ischemic heart disease     has had remote MI in 1997 and was treated with TPA. Prior PCI to RCA with repeat PCI to the RCA in 2003  . Kidney stone     x11  . Normal nuclear stress test Feb 2012    No significant ischemia. EF 56%; with basal inferior scar  . History of nephrolithiasis 01/23/2012  . Arthritis   . History of colon polyps   . GERD (gastroesophageal reflux disease)     Past Surgical History  Procedure Laterality Date  . Colonoscopy      colon cancer  . Coronary stent placement  1997 & 2003    PCI to RCA x 2  . Colon surgery      x 2 for cancer  . Eye surgery      History  Smoking status  . Former Smoker  . Quit date: 10/20/1985  Smokeless tobacco  . Never Used  History  Alcohol Use No    Family History  Problem Relation Age of Onset  . Arthritis Sister   . Breast cancer Sister   . Colon polyps Son   . Colon polyps Daughter     Review of Systems: The review of systems is per the HPI.  All other systems were reviewed and are negative.  Physical Exam: BP 120/80  Pulse 67  Ht 5\' 6"  (1.676 m)  Wt 177 lb (80.287 kg)  BMI 28.58 kg/m2  SpO2 98% Patient is very pleasant and in no acute distress. Skin is warm and dry. Color is normal.  HEENT is unremarkable. Normocephalic/atraumatic. PERRL. Sclera are nonicteric. Neck is supple. No masses. No JVD. Lungs are clear. Cardiac exam shows a regular rate and rhythm. Abdomen is soft. Extremities are without edema. Gait and ROM are intact. No gross neurologic deficits noted.  LABORATORY DATA:  Lab Results  Component Value Date   WBC 12.6* 05/27/2013   HGB 15.7 05/27/2013   HCT 43.0 05/27/2013   PLT 155 05/27/2013   GLUCOSE 132* 05/27/2013   CHOL 178 01/27/2013   TRIG 155.0* 01/27/2013    HDL 33.70* 01/27/2013   LDLCALC 113* 01/27/2013   ALT 23 05/27/2013   AST 30 05/27/2013   NA 134* 05/27/2013   K 4.3 05/27/2013   CL 100 05/27/2013   CREATININE 1.34 05/27/2013   BUN 29* 05/27/2013   CO2 23 05/27/2013   TSH 0.92 01/27/2013   PSA 1.12 01/27/2013     Assessment / Plan: 1. CAD - doing well clinically.   2. HTN - BP is ok - no change with his current regimen   3. HLD  Overall, he is doing well. I will see him back in 6 months.  Patient is agreeable to this plan and will call if any problems develop in the interim.   Burtis Junes, RN, [place]  [address] [place]  [place], [place] [address]

## 2013-12-07 ENCOUNTER — Encounter: Payer: Self-pay | Admitting: Cardiology

## 2013-12-15 ENCOUNTER — Ambulatory Visit: Payer: Medicare Other | Admitting: Internal Medicine

## 2013-12-26 ENCOUNTER — Other Ambulatory Visit: Payer: Medicare HMO

## 2013-12-30 ENCOUNTER — Other Ambulatory Visit (INDEPENDENT_AMBULATORY_CARE_PROVIDER_SITE_OTHER): Payer: Medicare HMO

## 2013-12-30 ENCOUNTER — Ambulatory Visit (INDEPENDENT_AMBULATORY_CARE_PROVIDER_SITE_OTHER): Payer: Medicare HMO | Admitting: Internal Medicine

## 2013-12-30 ENCOUNTER — Encounter: Payer: Self-pay | Admitting: Internal Medicine

## 2013-12-30 VITALS — BP 140/76 | HR 60 | Temp 96.9°F | Wt 175.0 lb

## 2013-12-30 DIAGNOSIS — R270 Ataxia, unspecified: Secondary | ICD-10-CM

## 2013-12-30 DIAGNOSIS — R279 Unspecified lack of coordination: Secondary | ICD-10-CM

## 2013-12-30 DIAGNOSIS — N3090 Cystitis, unspecified without hematuria: Secondary | ICD-10-CM

## 2013-12-30 DIAGNOSIS — I10 Essential (primary) hypertension: Secondary | ICD-10-CM

## 2013-12-30 DIAGNOSIS — I2510 Atherosclerotic heart disease of native coronary artery without angina pectoris: Secondary | ICD-10-CM

## 2013-12-30 DIAGNOSIS — E785 Hyperlipidemia, unspecified: Secondary | ICD-10-CM

## 2013-12-30 DIAGNOSIS — Z23 Encounter for immunization: Secondary | ICD-10-CM

## 2013-12-30 LAB — CBC WITH DIFFERENTIAL/PLATELET
BASOS PCT: 0.2 % (ref 0.0–3.0)
Basophils Absolute: 0 10*3/uL (ref 0.0–0.1)
EOS PCT: 4.2 % (ref 0.0–5.0)
Eosinophils Absolute: 0.4 10*3/uL (ref 0.0–0.7)
HCT: 44.5 % (ref 39.0–52.0)
HEMOGLOBIN: 15.2 g/dL (ref 13.0–17.0)
LYMPHS PCT: 15.8 % (ref 12.0–46.0)
Lymphs Abs: 1.3 10*3/uL (ref 0.7–4.0)
MCHC: 34.1 g/dL (ref 30.0–36.0)
MCV: 104.9 fl — AB (ref 78.0–100.0)
Monocytes Absolute: 1 10*3/uL (ref 0.1–1.0)
Monocytes Relative: 11.9 % (ref 3.0–12.0)
NEUTROS ABS: 5.7 10*3/uL (ref 1.4–7.7)
NEUTROS PCT: 67.9 % (ref 43.0–77.0)
Platelets: 161 10*3/uL (ref 150.0–400.0)
RBC: 4.24 Mil/uL (ref 4.22–5.81)
RDW: 12.7 % (ref 11.5–14.6)
WBC: 8.4 10*3/uL (ref 4.5–10.5)

## 2013-12-30 LAB — URINALYSIS
BILIRUBIN URINE: NEGATIVE
Hgb urine dipstick: NEGATIVE
Ketones, ur: NEGATIVE
LEUKOCYTES UA: NEGATIVE
NITRITE: NEGATIVE
PH: 7 (ref 5.0–8.0)
SPECIFIC GRAVITY, URINE: 1.015 (ref 1.000–1.030)
Total Protein, Urine: NEGATIVE
Urine Glucose: NEGATIVE
Urobilinogen, UA: 0.2 (ref 0.0–1.0)

## 2013-12-30 LAB — LIPID PANEL
CHOL/HDL RATIO: 4
Cholesterol: 155 mg/dL (ref 0–200)
HDL: 39.7 mg/dL (ref >39.00)
LDL CALC: 88 mg/dL (ref 0–99)
TRIGLYCERIDES: 135 mg/dL (ref 0.0–149.0)
VLDL: 27 mg/dL (ref 0.0–40.0)

## 2013-12-30 LAB — BASIC METABOLIC PANEL
BUN: 22 mg/dL (ref 6–23)
CALCIUM: 10 mg/dL (ref 8.4–10.5)
CHLORIDE: 103 meq/L (ref 96–112)
CO2: 30 mEq/L (ref 19–32)
CREATININE: 1.2 mg/dL (ref 0.4–1.5)
GFR: 61.56 mL/min (ref >60.00)
Glucose, Bld: 103 mg/dL — ABNORMAL HIGH (ref 70–99)
Potassium: 3.5 mEq/L (ref 3.5–5.1)
SODIUM: 139 meq/L (ref 135–145)

## 2013-12-30 LAB — HEPATIC FUNCTION PANEL
ALBUMIN: 4.1 g/dL (ref 3.5–5.2)
ALT: 26 U/L (ref 0–53)
AST: 26 U/L (ref 0–37)
Alkaline Phosphatase: 57 U/L (ref 39–117)
Bilirubin, Direct: 0.1 mg/dL (ref 0.0–0.3)
Total Bilirubin: 0.9 mg/dL (ref 0.3–1.2)
Total Protein: 7.3 g/dL (ref 6.0–8.3)

## 2013-12-30 LAB — TSH: TSH: 1.04 u[IU]/mL (ref 0.35–5.50)

## 2013-12-30 NOTE — Assessment & Plan Note (Signed)
Continue with current prescription therapy as reflected on the Med list.  

## 2013-12-30 NOTE — Assessment & Plan Note (Signed)
UA

## 2013-12-30 NOTE — Progress Notes (Signed)
Pre visit review using our clinic review tool, if applicable. No additional management support is needed unless otherwise documented below in the visit note.   Subjective:    HPI The patient presents for a follow-up of  chronic hypertension, chronic dyslipidemia,  controlled with medicines, elev MCV. C/o insomnia. F/u stress w/wife - she had some heart issues - better... He will come in 6 mo for wellness (declined earlier visit /labs).  BP Readings from Last 3 Encounters:  12/30/13 140/76  11/30/13 120/80  06/29/13 140/80    Wt Readings from Last 3 Encounters:  12/30/13 175 lb (79.379 kg)  11/30/13 177 lb (80.287 kg)  06/29/13 175 lb 1.9 oz (79.434 kg)      Review of Systems  Constitutional: Negative for appetite change, fatigue and unexpected weight change.  HENT: Negative for nosebleeds, congestion, sore throat, sneezing, trouble swallowing and neck pain.   Eyes: Negative for itching and visual disturbance.  Respiratory: Negative for cough.   Cardiovascular: Negative for chest pain, palpitations and leg swelling.  Gastrointestinal: Negative for nausea, diarrhea, blood in stool and abdominal distention.  Genitourinary: Negative for frequency and hematuria.  Musculoskeletal: Negative for back pain, joint swelling and gait problem.  Skin: Negative for rash.  Neurological: Negative for dizziness, tremors, speech difficulty and weakness.  Psychiatric/Behavioral: Negative for sleep disturbance, dysphoric mood and agitation. The patient is not nervous/anxious.        Objective:   Physical Exam  Constitutional: He is oriented to person, place, and time. He appears well-developed.  HENT:  Mouth/Throat: Oropharynx is clear and moist.  Eyes: Conjunctivae are normal. Pupils are equal, round, and reactive to light.  Neck: Normal range of motion. No JVD present. No thyromegaly present.  Cardiovascular: Normal rate, regular rhythm, normal heart sounds and intact distal pulses.  Exam  reveals no gallop and no friction rub.   No murmur heard. Pulmonary/Chest: Effort normal and breath sounds normal. No respiratory distress. He has no wheezes. He has no rales. He exhibits no tenderness.  Abdominal: Soft. Bowel sounds are normal. He exhibits no distension and no mass. There is no tenderness. There is no rebound and no guarding.  Musculoskeletal: Normal range of motion. He exhibits no edema and no tenderness.  Lymphadenopathy:    He has no cervical adenopathy.  Neurological: He is alert and oriented to person, place, and time. He has normal reflexes. No cranial nerve deficit. He exhibits normal muscle tone. Coordination normal.  Skin: Skin is warm and dry. No rash noted.  Psychiatric: He has a normal mood and affect. His behavior is normal. Judgment and thought content normal.  Ataxic     Lab Results  Component Value Date   WBC 12.6* 05/27/2013   HGB 15.7 05/27/2013   HCT 43.0 05/27/2013   PLT 155 05/27/2013   GLUCOSE 132* 05/27/2013   CHOL 178 01/27/2013   TRIG 155.0* 01/27/2013   HDL 33.70* 01/27/2013   LDLCALC 113* 01/27/2013   ALT 23 05/27/2013   AST 30 05/27/2013   NA 134* 05/27/2013   K 4.3 05/27/2013   CL 100 05/27/2013   CREATININE 1.34 05/27/2013   BUN 29* 05/27/2013   CO2 23 05/27/2013   TSH 0.92 01/27/2013   PSA 1.12 01/27/2013           Assessment & Plan:

## 2014-01-02 ENCOUNTER — Telehealth: Payer: Self-pay | Admitting: Internal Medicine

## 2014-01-02 NOTE — Telephone Encounter (Signed)
Relevant patient education mailed to patient.  

## 2014-03-01 ENCOUNTER — Other Ambulatory Visit: Payer: Self-pay | Admitting: *Deleted

## 2014-03-01 MED ORDER — SIMVASTATIN 80 MG PO TABS: 80.0000 mg | ORAL_TABLET | ORAL | Status: DC

## 2014-03-17 ENCOUNTER — Encounter: Payer: Self-pay | Admitting: Internal Medicine

## 2014-03-17 ENCOUNTER — Ambulatory Visit (INDEPENDENT_AMBULATORY_CARE_PROVIDER_SITE_OTHER): Payer: Medicare HMO | Admitting: Internal Medicine

## 2014-03-17 VITALS — BP 138/80 | HR 76 | Temp 97.5°F | Resp 16 | Wt 173.0 lb

## 2014-03-17 DIAGNOSIS — I2510 Atherosclerotic heart disease of native coronary artery without angina pectoris: Secondary | ICD-10-CM

## 2014-03-17 DIAGNOSIS — E785 Hyperlipidemia, unspecified: Secondary | ICD-10-CM

## 2014-03-17 DIAGNOSIS — I10 Essential (primary) hypertension: Secondary | ICD-10-CM

## 2014-03-17 DIAGNOSIS — G4700 Insomnia, unspecified: Secondary | ICD-10-CM

## 2014-03-17 DIAGNOSIS — J45909 Unspecified asthma, uncomplicated: Secondary | ICD-10-CM

## 2014-03-17 MED ORDER — SIMVASTATIN 40 MG PO TABS: 40.0000 mg | ORAL_TABLET | Freq: Every day | ORAL | Status: DC

## 2014-03-17 MED ORDER — UMECLIDINIUM-VILANTEROL 62.5-25 MCG/INH IN AEPB: 1.0000 | INHALATION_SPRAY | Freq: Every day | RESPIRATORY_TRACT | Status: DC

## 2014-03-17 MED ORDER — BENAZEPRIL HCL 20 MG PO TABS: 20.0000 mg | ORAL_TABLET | Freq: Two times a day (BID) | ORAL | Status: DC

## 2014-03-17 MED ORDER — OMEPRAZOLE 20 MG PO CPDR: 20.0000 mg | DELAYED_RELEASE_CAPSULE | Freq: Every day | ORAL | Status: DC

## 2014-03-17 NOTE — Assessment & Plan Note (Signed)
Continue with current prn prescription therapy as reflected on the Med list.  

## 2014-03-17 NOTE — Assessment & Plan Note (Signed)
Reduced Zocor to 40 mg/d due to age/other meds

## 2014-03-17 NOTE — Progress Notes (Signed)
Pre visit review using our clinic review tool, if applicable. No additional management support is needed unless otherwise documented below in the visit note. 

## 2014-03-17 NOTE — Assessment & Plan Note (Signed)
Continue with current prescription therapy as reflected on the Med list.  

## 2014-03-17 NOTE — Progress Notes (Signed)
    Subjective:    C/o recent URI - now is on Levaquin, Proair, Prednisone. Some better - still coughing/wheezing The patient presents for a follow-up of  chronic hypertension, chronic dyslipidemia,  controlled with medicines, elev MCV. F/u insomnia.   BP Readings from Last 3 Encounters:  03/17/14 138/80  12/30/13 140/76  11/30/13 120/80    Wt Readings from Last 3 Encounters:  03/17/14 173 lb (78.472 kg)  12/30/13 175 lb (79.379 kg)  11/30/13 177 lb (80.287 kg)      Review of Systems  Constitutional: Negative for appetite change, fatigue and unexpected weight change.  HENT: Negative for nosebleeds, congestion, sore throat, sneezing, trouble swallowing and neck pain.   Eyes: Negative for itching and visual disturbance.  Respiratory: Negative for cough.   Cardiovascular: Negative for chest pain, palpitations and leg swelling.  Gastrointestinal: Negative for nausea, diarrhea, blood in stool and abdominal distention.  Genitourinary: Negative for frequency and hematuria.  Musculoskeletal: Negative for back pain, joint swelling and gait problem.  Skin: Negative for rash.  Neurological: Negative for dizziness, tremors, speech difficulty and weakness.  Psychiatric/Behavioral: Negative for sleep disturbance, dysphoric mood and agitation. The patient is not nervous/anxious.        Objective:   Physical Exam  Constitutional: He is oriented to person, place, and time. He appears well-developed.  HENT:  Mouth/Throat: Oropharynx is clear and moist.  Eyes: Conjunctivae are normal. Pupils are equal, round, and reactive to light.  Neck: Normal range of motion. No JVD present. No thyromegaly present.  Cardiovascular: Normal rate, regular rhythm, normal heart sounds and intact distal pulses.  Exam reveals no gallop and no friction rub.   No murmur heard. Pulmonary/Chest: Effort normal and breath sounds normal. No respiratory distress. He has no wheezes. He has no rales. He exhibits no  tenderness.  Abdominal: Soft. Bowel sounds are normal. He exhibits no distension and no mass. There is no tenderness. There is no rebound and no guarding.  Musculoskeletal: Normal range of motion. He exhibits no edema and no tenderness.  Lymphadenopathy:    He has no cervical adenopathy.  Neurological: He is alert and oriented to person, place, and time. He has normal reflexes. No cranial nerve deficit. He exhibits normal muscle tone. Coordination normal.  Skin: Skin is warm and dry. No rash noted.  Psychiatric: He has a normal mood and affect. His behavior is normal. Judgment and thought content normal.  Mild B ronchi Ataxic     Lab Results  Component Value Date   WBC 8.4 12/30/2013   HGB 15.2 12/30/2013   HCT 44.5 12/30/2013   PLT 161.0 12/30/2013   GLUCOSE 103* 12/30/2013   CHOL 155 12/30/2013   TRIG 135.0 12/30/2013   HDL 39.70 12/30/2013   LDLCALC 88 12/30/2013   ALT 26 12/30/2013   AST 26 12/30/2013   NA 139 12/30/2013   K 3.5 12/30/2013   CL 103 12/30/2013   CREATININE 1.2 12/30/2013   BUN 22 12/30/2013   CO2 30 12/30/2013   TSH 1.04 12/30/2013   PSA 1.12 01/27/2013     I personally provided Anoro inhaler use teaching. After the teaching patient was able to demonstrate it's use effectively. All questions were answered       Assessment & Plan:

## 2014-03-17 NOTE — Assessment & Plan Note (Signed)
dded Anoro We may need to d/c Benazepril RTC 1 mo

## 2014-03-29 ENCOUNTER — Encounter: Payer: Self-pay | Admitting: Nurse Practitioner

## 2014-03-29 ENCOUNTER — Ambulatory Visit (INDEPENDENT_AMBULATORY_CARE_PROVIDER_SITE_OTHER): Payer: Medicare HMO | Admitting: Nurse Practitioner

## 2014-03-29 VITALS — BP 110/70 | HR 85 | Ht 66.0 in | Wt 174.4 lb

## 2014-03-29 DIAGNOSIS — I10 Essential (primary) hypertension: Secondary | ICD-10-CM

## 2014-03-29 DIAGNOSIS — I2510 Atherosclerotic heart disease of native coronary artery without angina pectoris: Secondary | ICD-10-CM

## 2014-03-29 DIAGNOSIS — E785 Hyperlipidemia, unspecified: Secondary | ICD-10-CM

## 2014-03-29 MED ORDER — LOSARTAN POTASSIUM 50 MG PO TABS: 50.0000 mg | ORAL_TABLET | Freq: Every day | ORAL | Status: DC

## 2014-03-29 NOTE — Progress Notes (Signed)
Samuel Santiago Date of Birth: 02-Jan-1929 Medical Record #[id number]  History of Present Illness: Samuel Santiago is seen back today for a follow up visit. It is a 4 month check. Seen for Dr. Aundra Dubin - former patient of Dr. Susa Simmonds. Has known CAD with remote MI treated with thrombolysis in 1997, followed by 2 stents to the RCA. Last Myoview from 2012 was normal. Other issues include colon cancer in 1990 with colectomy and recurrence in 2003 treated with chemo, HTN, HLD and nephrolithiasis.   Last seen here back in February of 2015 and he was doing well.   Comes back today. Here with his wife. No chest pain. Has had some recent bronchitis managed by PCP - wife just had long bout as well. Having trouble with the new inhaler but notes it does help when he is able to take correctly. Still with cough and has had dry hacky cough for several months - on ACE. Dose of statin cut back by PCP. Labs from March reviewed. Wife concerned about his memory and asking about possible medicines.  Current Outpatient Prescriptions  Medication Sig Dispense Refill  . aspirin EC 325 MG tablet Take 325 mg by mouth daily.      . Cholecalciferol (VITAMIN D-3 PO) Take by mouth daily.      . hydrochlorothiazide (HYDRODIURIL) 25 MG tablet Take 1 tablet (25 mg total) by mouth daily.  90 tablet  3  . Multiple Vitamin (MULTIVITAMIN WITH MINERALS) TABS tablet Take 1 tablet by mouth daily.      . Omega-3 Fatty Acids (FISH OIL PO) Take 1 capsule by mouth daily.      Marland Kitchen omeprazole (PRILOSEC) 20 MG capsule Take 1 capsule (20 mg total) by mouth daily.  90 capsule  3  . simvastatin (ZOCOR) 40 MG tablet Take 1 tablet (40 mg total) by mouth at bedtime.  90 tablet  3  . Umeclidinium-Vilanterol (ANORO ELLIPTA) 62.5-25 MCG/INH AEPB Inhale 1 Act into the lungs daily.  1 each  11  . losartan (COZAAR) 50 MG tablet Take 1 tablet (50 mg total) by mouth daily.  90 tablet  3  . [DISCONTINUED] omeprazole (PRILOSEC OTC) 20 MG tablet Take 1 tablet (20 mg  total) by mouth daily.  90 tablet  3   No current facility-administered medications for this visit.    No Known Allergies  Past Medical History  Diagnosis Date  . Hyperlipidemia   . Colon cancer 1990    Had recurrence in 2003 resultinbg in chemotherapy. He recently had a colonoscopy and  had a polyp removed that was benign.  . Hypertension     probably related to lack of morning medications and stress of being at most at night in hospital with his wife  . Ischemic heart disease     has had remote MI in 1997 and was treated with TPA. Prior PCI to RCA with repeat PCI to the RCA in 2003  . Kidney stone     x11  . Normal nuclear stress test Feb 2012    No significant ischemia. EF 56%; with basal inferior scar  . History of nephrolithiasis 01/23/2012  . Arthritis   . History of colon polyps   . GERD (gastroesophageal reflux disease)     Past Surgical History  Procedure Laterality Date  . Colonoscopy      colon cancer  . Coronary stent placement  1997 & 2003    PCI to RCA x 2  . Colon surgery  x 2 for cancer  . Eye surgery      History  Smoking status  . Former Smoker  . Quit date: 10/20/1985  Smokeless tobacco  . Never Used    History  Alcohol Use No    Family History  Problem Relation Age of Onset  . Arthritis Sister   . Breast cancer Sister   . Colon polyps Son   . Colon polyps Daughter     Review of Systems: The review of systems is per the HPI.  All other systems were reviewed and are negative.  Physical Exam: BP 110/70  Pulse 85  Ht 5\' 6"  (1.676 m)  Wt 174 lb 6.4 oz (79.107 kg)  BMI 28.16 kg/m2  SpO2 97% Patient is very pleasant and in no acute distress. Skin is warm and dry. Color is normal.  HEENT is unremarkable. Normocephalic/atraumatic. PERRL. Sclera are nonicteric. Neck is supple. No masses. No JVD. Lungs are clear. Cardiac exam shows a regular rate and rhythm. Abdomen is soft. Extremities are without edema. Gait and ROM are intact. No gross  neurologic deficits noted.  LABORATORY DATA: Lab Results  Component Value Date   WBC 8.4 12/30/2013   HGB 15.2 12/30/2013   HCT 44.5 12/30/2013   PLT 161.0 12/30/2013   GLUCOSE 103* 12/30/2013   CHOL 155 12/30/2013   TRIG 135.0 12/30/2013   HDL 39.70 12/30/2013   LDLCALC 88 12/30/2013   ALT 26 12/30/2013   AST 26 12/30/2013   NA 139 12/30/2013   K 3.5 12/30/2013   CL 103 12/30/2013   CREATININE 1.2 12/30/2013   BUN 22 12/30/2013   CO2 30 12/30/2013   TSH 1.04 12/30/2013   PSA 1.12 01/27/2013     Assessment / Plan:  1. CAD - doing well clinically.   2. HTN - BP is very good -low normal here today - no change with his current regimen but with possible ACE cough - I am stopping the ACE and will give him Losartan 50 mg QD. He will monitor his BP and call me if his BP goes too high.  3. HLD - labs from March ok  4. Possible memory issues - he will discuss with PCP  5. Bronchitis  Overall, he is doing well from our standpoint. I will see him back in 6 months.   Patient is agreeable to this plan and will call if any problems develop in the interim.   Burtis Junes, RN, [facility]  [address] [place]  [place], [place] [address]

## 2014-03-29 NOTE — Patient Instructions (Addendum)
Stay on your current medicines but I  am stopping your Lotensin (benazepril) and starting you on Losartan 50 mg each day  Monitor your blood pressure over this next month - call me if consistently above 140/90  Talk with Dr. Alain Marion about your memory  I will see you in 6 months  Stay active  Call the [place] office at [phone] if you have any questions, problems or concerns.

## 2014-03-31 ENCOUNTER — Ambulatory Visit (INDEPENDENT_AMBULATORY_CARE_PROVIDER_SITE_OTHER): Payer: Medicare HMO | Admitting: Internal Medicine

## 2014-03-31 ENCOUNTER — Encounter: Payer: Self-pay | Admitting: Internal Medicine

## 2014-03-31 VITALS — BP 110/64 | HR 72 | Temp 98.4°F | Resp 16 | Wt 175.0 lb

## 2014-03-31 DIAGNOSIS — I2510 Atherosclerotic heart disease of native coronary artery without angina pectoris: Secondary | ICD-10-CM

## 2014-03-31 DIAGNOSIS — R279 Unspecified lack of coordination: Secondary | ICD-10-CM

## 2014-03-31 DIAGNOSIS — J45909 Unspecified asthma, uncomplicated: Secondary | ICD-10-CM

## 2014-03-31 DIAGNOSIS — I10 Essential (primary) hypertension: Secondary | ICD-10-CM

## 2014-03-31 DIAGNOSIS — R413 Other amnesia: Secondary | ICD-10-CM

## 2014-03-31 DIAGNOSIS — E785 Hyperlipidemia, unspecified: Secondary | ICD-10-CM

## 2014-03-31 DIAGNOSIS — R270 Ataxia, unspecified: Secondary | ICD-10-CM

## 2014-03-31 NOTE — Assessment & Plan Note (Signed)
Use a cane 

## 2014-03-31 NOTE — Assessment & Plan Note (Signed)
Continue with current prescription therapy as reflected on the Med list.  

## 2014-03-31 NOTE — Assessment & Plan Note (Signed)
Use tasks Ginko biloba

## 2014-03-31 NOTE — Progress Notes (Signed)
    Subjective:    F/u URI - finished Levaquin, Prednisone. Much better  The patient presents for a follow-up of  chronic hypertension, chronic dyslipidemia,  controlled with medicines, elev MCV. F/u insomnia. C/o memory issues   BP Readings from Last 3 Encounters:  03/31/14 110/64  03/29/14 110/70  03/17/14 138/80    Wt Readings from Last 3 Encounters:  03/31/14 175 lb (79.379 kg)  03/29/14 174 lb 6.4 oz (79.107 kg)  03/17/14 173 lb (78.472 kg)      Review of Systems  Constitutional: Negative for appetite change, fatigue and unexpected weight change.  HENT: Negative for nosebleeds, congestion, sore throat, sneezing, trouble swallowing and neck pain.   Eyes: Negative for itching and visual disturbance.  Respiratory: Negative for cough.   Cardiovascular: Negative for chest pain, palpitations and leg swelling.  Gastrointestinal: Negative for nausea, diarrhea, blood in stool and abdominal distention.  Genitourinary: Negative for frequency and hematuria.  Musculoskeletal: Negative for back pain, joint swelling and gait problem.  Skin: Negative for rash.  Neurological: Negative for dizziness, tremors, speech difficulty and weakness.  Psychiatric/Behavioral: Negative for sleep disturbance, dysphoric mood and agitation. The patient is not nervous/anxious.        Objective:   Physical Exam  Constitutional: He is oriented to person, place, and time. He appears well-developed.  HENT:  Mouth/Throat: Oropharynx is clear and moist.  Eyes: Conjunctivae are normal. Pupils are equal, round, and reactive to light.  Neck: Normal range of motion. No JVD present. No thyromegaly present.  Cardiovascular: Normal rate, regular rhythm, normal heart sounds and intact distal pulses.  Exam reveals no gallop and no friction rub.   No murmur heard. Pulmonary/Chest: Effort normal and breath sounds normal. No respiratory distress. He has no wheezes. He has no rales. He exhibits no tenderness.   Abdominal: Soft. Bowel sounds are normal. He exhibits no distension and no mass. There is no tenderness. There is no rebound and no guarding.  Musculoskeletal: Normal range of motion. He exhibits no edema and no tenderness.  Lymphadenopathy:    He has no cervical adenopathy.  Neurological: He is alert and oriented to person, place, and time. He has normal reflexes. No cranial nerve deficit. He exhibits normal muscle tone. Coordination normal.  Skin: Skin is warm and dry. No rash noted.  Psychiatric: He has a normal mood and affect. His behavior is normal. Judgment and thought content normal.  No ronchi Ataxic     Lab Results  Component Value Date   WBC 8.4 12/30/2013   HGB 15.2 12/30/2013   HCT 44.5 12/30/2013   PLT 161.0 12/30/2013   GLUCOSE 103* 12/30/2013   CHOL 155 12/30/2013   TRIG 135.0 12/30/2013   HDL 39.70 12/30/2013   LDLCALC 88 12/30/2013   ALT 26 12/30/2013   AST 26 12/30/2013   NA 139 12/30/2013   K 3.5 12/30/2013   CL 103 12/30/2013   CREATININE 1.2 12/30/2013   BUN 22 12/30/2013   CO2 30 12/30/2013   TSH 1.04 12/30/2013   PSA 1.12 01/27/2013      Assessment & Plan:

## 2014-03-31 NOTE — Progress Notes (Signed)
Pre visit review using our clinic review tool, if applicable. No additional management support is needed unless otherwise documented below in the visit note. 

## 2014-03-31 NOTE — Patient Instructions (Signed)
Ginko biloba for memory

## 2014-03-31 NOTE — Assessment & Plan Note (Signed)
Resolving  OK to d/c Anoro in 1 wk

## 2014-04-04 ENCOUNTER — Ambulatory Visit: Payer: Medicare HMO | Admitting: Internal Medicine

## 2014-04-26 ENCOUNTER — Other Ambulatory Visit: Payer: Self-pay | Admitting: Internal Medicine

## 2014-07-06 ENCOUNTER — Ambulatory Visit: Payer: Medicare HMO | Admitting: Internal Medicine

## 2014-08-03 ENCOUNTER — Other Ambulatory Visit: Payer: Self-pay

## 2014-08-03 MED ORDER — HYDROCHLOROTHIAZIDE 25 MG PO TABS: 25.0000 mg | ORAL_TABLET | Freq: Every day | ORAL | Status: DC

## 2014-09-12 ENCOUNTER — Telehealth: Payer: Self-pay | Admitting: Internal Medicine

## 2014-09-12 NOTE — Telephone Encounter (Signed)
Patient's spouse called stating the patient has several upcoming surgeries and procedures and would like to know if Dr. Alain Marion would give him a handicap placard. She states it's very difficult for him at some of the large medical centers. CB# [phone]. They would like it mailed to their home address. Patient and spouse both have upcoming appts 12/30, but need this ASAP.

## 2014-09-13 NOTE — Telephone Encounter (Signed)
OK. Thx

## 2014-09-18 NOTE — Telephone Encounter (Signed)
LVM for pt to call back.

## 2014-09-19 NOTE — Telephone Encounter (Signed)
Notified pt md ok hanicap form mail to address today...Samuel Santiago

## 2014-09-26 ENCOUNTER — Ambulatory Visit (INDEPENDENT_AMBULATORY_CARE_PROVIDER_SITE_OTHER): Payer: Medicare HMO | Admitting: Nurse Practitioner

## 2014-09-26 VITALS — BP 130/80 | HR 67 | Ht 66.0 in | Wt 179.8 lb

## 2014-09-26 DIAGNOSIS — E785 Hyperlipidemia, unspecified: Secondary | ICD-10-CM

## 2014-09-26 DIAGNOSIS — I10 Essential (primary) hypertension: Secondary | ICD-10-CM

## 2014-09-26 DIAGNOSIS — I259 Chronic ischemic heart disease, unspecified: Secondary | ICD-10-CM

## 2014-09-26 MED ORDER — SIMVASTATIN 40 MG PO TABS: 40.0000 mg | ORAL_TABLET | Freq: Every day | ORAL | Status: DC

## 2014-09-26 NOTE — Patient Instructions (Signed)
Stay on your current medicines  See me in 6 months  Call the [facility] office at [phone] if you have any questions, problems or concerns.   

## 2014-09-26 NOTE — Progress Notes (Signed)
Samuel Santiago BMWUXLK Date of Birth: May 27, 1929 Medical Record #[id number]  History of Present Illness: Samuel Santiago is seen back today for a follow up visit. It is a 6 month check. Seen for Dr. Aundra Dubin - former patient of Dr. Susa Simmonds. Has known CAD with remote MI treated with thrombolysis in 1997, followed by 2 stents to the RCA. Last Myoview from 2012 was normal. Other issues include colon cancer in 1990 with colectomy and recurrence in 2003 treated with chemo, HTN, HLD and nephrolithiasis.   Last seen here back in June of 2015 and he was doing well but was recovering from bronchitis. Continued to have some cough and I switched him to ARB therapy.   Comes back today. Here with his wife. He is doing ok. No chest pain. Not short of breath. Seems to be slowing down. Not walking as much. Wife concerned about his gait - seems to be shuffling more - she is worried about possible Parkinson's. Seeing PCP later this month. No falls but using a cane. For eye surgery later this month. BP has been good at home. Tolerating his medicines.   Current Outpatient Prescriptions  Medication Sig Dispense Refill  . aspirin EC 325 MG tablet Take 325 mg by mouth daily.    Marland Kitchen erythromycin ophthalmic ointment Place 1 application into both eyes 2 (two) times daily. 1 application 2 times daily. Both eyes    . hydrochlorothiazide (HYDRODIURIL) 25 MG tablet Take 1 tablet (25 mg total) by mouth daily. 90 tablet 3  . losartan (COZAAR) 50 MG tablet Take 1 tablet (50 mg total) by mouth daily. 90 tablet 3  . omeprazole (PRILOSEC) 20 MG capsule Take 1 capsule (20 mg total) by mouth daily. 90 capsule 3  . polyvinyl alcohol-povidone (REFRESH) 1.4-0.6 % ophthalmic solution Place 1 drop into both eyes 4 (four) times daily. as needed (for dry eyes).    . simvastatin (ZOCOR) 40 MG tablet Take 1 tablet (40 mg total) by mouth at bedtime. 90 tablet 3  . Umeclidinium-Vilanterol (ANORO ELLIPTA) 62.5-25 MCG/INH AEPB Inhale 1 Act into the lungs  daily. 1 each 11  . Cholecalciferol (VITAMIN D-3 PO) Take by mouth daily.    . Multiple Vitamin (MULTIVITAMIN WITH MINERALS) TABS tablet Take 1 tablet by mouth daily.    . Omega-3 Fatty Acids (FISH OIL PO) Take 1 capsule by mouth daily.    . [DISCONTINUED] omeprazole (PRILOSEC OTC) 20 MG tablet Take 1 tablet (20 mg total) by mouth daily. 90 tablet 3   No current facility-administered medications for this visit.    Allergies  Allergen Reactions  . Tape Other (See Comments)    Pulled off skin one time--- "it really sticks to me" but will probably tolerate paper tape     Past Medical History  Diagnosis Date  . Hyperlipidemia   . Colon cancer 1990    Had recurrence in 2003 resultinbg in chemotherapy. He recently had a colonoscopy and  had a polyp removed that was benign.  . Hypertension     probably related to lack of morning medications and stress of being at most at night in hospital with his wife  . Ischemic heart disease     has had remote MI in 1997 and was treated with TPA. Prior PCI to RCA with repeat PCI to the RCA in 2003  . Kidney stone     x11  . Normal nuclear stress test Feb 2012    No significant ischemia. EF 56%; with basal inferior scar  .  History of nephrolithiasis 01/23/2012  . Arthritis   . History of colon polyps   . GERD (gastroesophageal reflux disease)     Past Surgical History  Procedure Laterality Date  . Colonoscopy      colon cancer  . Coronary stent placement  1997 & 2003    PCI to RCA x 2  . Colon surgery      x 2 for cancer  . Eye surgery      History  Smoking status  . Former Smoker  . Quit date: 10/20/1985  Smokeless tobacco  . Never Used    History  Alcohol Use No    Family History  Problem Relation Age of Onset  . Arthritis Sister   . Breast cancer Sister   . Colon polyps Son   . Colon polyps Daughter     Review of Systems: The review of systems is per the HPI.  All other systems were reviewed and are negative.  Physical  Exam: BP 130/80 mmHg  Pulse 67  Ht 5\' 6"  (1.676 m)  Wt 179 lb 12.8 oz (81.557 kg)  BMI 29.03 kg/m2 Patient is very pleasant and in no acute distress. Skin is warm and dry. Color is normal.  HEENT is unremarkable but has ectropion. Normocephalic/atraumatic. PERRL. Sclera are nonicteric. Neck is supple. No masses. No JVD. Lungs are clear. Cardiac exam shows a regular rate and rhythm. Abdomen is soft. Extremities are without edema. Gait and ROM are intact. Has a cane. Some shuffling noted. No gross neurologic deficits noted.  Wt Readings from Last 3 Encounters:  09/26/14 179 lb 12.8 oz (81.557 kg)  03/31/14 175 lb (79.379 kg)  03/29/14 174 lb 6.4 oz (79.107 kg)    LABORATORY DATA/PROCEDURES:  Lab Results  Component Value Date   WBC 8.4 12/30/2013   HGB 15.2 12/30/2013   HCT 44.5 12/30/2013   PLT 161.0 12/30/2013   GLUCOSE 103* 12/30/2013   CHOL 155 12/30/2013   TRIG 135.0 12/30/2013   HDL 39.70 12/30/2013   LDLCALC 88 12/30/2013   ALT 26 12/30/2013   AST 26 12/30/2013   NA 139 12/30/2013   K 3.5 12/30/2013   CL 103 12/30/2013   CREATININE 1.2 12/30/2013   BUN 22 12/30/2013   CO2 30 12/30/2013   TSH 1.04 12/30/2013   PSA 1.12 01/27/2013    BNP (last 3 results) No results for input(s): PROBNP in the last 8760 hours.   Assessment / Plan:  1. CAD - no symptoms noted. Continue with current regimen.  2. HTN - BP doing well.  3. HLD - on statin - refilled today.  4. Possible Parkinson's - seeing PCP later this month  Seems to be slowing down in a generalized fashion. Cardiac status stable. See back in 6 months.   Patient is agreeable to this plan and will call if any problems develop in the interim.   Burtis Junes, RN, [place] [address] [place], [place]  [address] [phone]

## 2014-10-18 ENCOUNTER — Ambulatory Visit (INDEPENDENT_AMBULATORY_CARE_PROVIDER_SITE_OTHER): Payer: Medicare HMO | Admitting: Internal Medicine

## 2014-10-18 ENCOUNTER — Encounter: Payer: Self-pay | Admitting: Internal Medicine

## 2014-10-18 VITALS — BP 160/100 | HR 100 | Temp 98.3°F | Resp 16 | Ht 66.0 in | Wt 174.0 lb

## 2014-10-18 DIAGNOSIS — I2510 Atherosclerotic heart disease of native coronary artery without angina pectoris: Secondary | ICD-10-CM

## 2014-10-18 DIAGNOSIS — E785 Hyperlipidemia, unspecified: Secondary | ICD-10-CM

## 2014-10-18 DIAGNOSIS — R270 Ataxia, unspecified: Secondary | ICD-10-CM

## 2014-10-18 DIAGNOSIS — R413 Other amnesia: Secondary | ICD-10-CM

## 2014-10-18 DIAGNOSIS — I10 Essential (primary) hypertension: Secondary | ICD-10-CM

## 2014-10-18 MED ORDER — DONEPEZIL HCL 5 MG PO TABS: 5.0000 mg | ORAL_TABLET | Freq: Every day | ORAL | Status: DC

## 2014-10-18 MED ORDER — SIMVASTATIN 40 MG PO TABS: 20.0000 mg | ORAL_TABLET | Freq: Every day | ORAL | Status: DC

## 2014-10-18 NOTE — Assessment & Plan Note (Signed)
He was taking 80 mg Zocor qd for some reason Will reduce dose

## 2014-10-18 NOTE — Assessment & Plan Note (Signed)
Continue with current prescription therapy as reflected on the Med list.  

## 2014-10-18 NOTE — Assessment & Plan Note (Addendum)
Discussed Mild dementia - Rx options discussed. Start Aricept

## 2014-10-18 NOTE — Assessment & Plan Note (Signed)
Chronic BP is nl at home

## 2014-10-18 NOTE — Progress Notes (Signed)
    Subjective:    C/o ectropion - L eye is much better -- Dr Lorin Mercy in [place] The patient presents for a follow-up of  chronic hypertension, chronic dyslipidemia,  controlled with medicines, elev MCV. F/u insomnia, dementia. BP is good at home  BP Readings from Last 3 Encounters:  10/18/14 160/100  09/26/14 130/80  03/31/14 110/64    Wt Readings from Last 3 Encounters:  10/18/14 174 lb (78.926 kg)  09/26/14 179 lb 12.8 oz (81.557 kg)  03/31/14 175 lb (79.379 kg)      Review of Systems  Constitutional: Negative for appetite change, fatigue and unexpected weight change.  HENT: Negative for nosebleeds, congestion, sore throat, sneezing, trouble swallowing and neck pain.   Eyes: Negative for itching and visual disturbance.  Respiratory: Negative for cough.   Cardiovascular: Negative for chest pain, palpitations and leg swelling.  Gastrointestinal: Negative for nausea, diarrhea, blood in stool and abdominal distention.  Genitourinary: Negative for frequency and hematuria.  Musculoskeletal: Negative for back pain, joint swelling and gait problem.  Skin: Negative for rash.  Neurological: Negative for dizziness, tremors, speech difficulty and weakness.  Psychiatric/Behavioral: Negative for sleep disturbance, dysphoric mood and agitation. The patient is not nervous/anxious.        Objective:   Physical Exam  Constitutional: He is oriented to person, place, and time. He appears well-developed.  HENT:  Mouth/Throat: Oropharynx is clear and moist.  Eyes: Conjunctivae are normal. Pupils are equal, round, and reactive to light.  Neck: Normal range of motion. No JVD present. No thyromegaly present.  Cardiovascular: Normal rate, regular rhythm, normal heart sounds and intact distal pulses.  Exam reveals no gallop and no friction rub.   No murmur heard. Pulmonary/Chest: Effort normal and breath sounds normal. No respiratory distress. He has no wheezes. He has no rales. He exhibits no  tenderness.  Abdominal: Soft. Bowel sounds are normal. He exhibits no distension and no mass. There is no tenderness. There is no rebound and no guarding.  Musculoskeletal: Normal range of motion. He exhibits no edema and no tenderness.  Lymphadenopathy:    He has no cervical adenopathy.  Neurological: He is alert and oriented to person, place, and time. He has normal reflexes. No cranial nerve deficit. He exhibits normal muscle tone. Coordination normal.  Skin: Skin is warm and dry. No rash noted.  Psychiatric: He has a normal mood and affect. His behavior is normal. Judgment and thought content normal.  Mild B ronchi R ectropion Ataxic - using a cane Alert, oriented x2 Serial 7s - one correct     Lab Results  Component Value Date   WBC 8.4 12/30/2013   HGB 15.2 12/30/2013   HCT 44.5 12/30/2013   PLT 161.0 12/30/2013   GLUCOSE 103* 12/30/2013   CHOL 155 12/30/2013   TRIG 135.0 12/30/2013   HDL 39.70 12/30/2013   LDLCALC 88 12/30/2013   ALT 26 12/30/2013   AST 26 12/30/2013   NA 139 12/30/2013   K 3.5 12/30/2013   CL 103 12/30/2013   CREATININE 1.2 12/30/2013   BUN 22 12/30/2013   CO2 30 12/30/2013   TSH 1.04 12/30/2013   PSA 1.12 01/27/2013         Assessment & Plan:

## 2015-01-17 ENCOUNTER — Ambulatory Visit: Payer: Medicare HMO | Admitting: Internal Medicine

## 2015-04-04 ENCOUNTER — Encounter: Payer: Self-pay | Admitting: Nurse Practitioner

## 2015-04-04 ENCOUNTER — Ambulatory Visit (INDEPENDENT_AMBULATORY_CARE_PROVIDER_SITE_OTHER): Payer: Medicare HMO | Admitting: Nurse Practitioner

## 2015-04-04 VITALS — BP 160/90 | HR 74 | Ht 66.0 in | Wt 173.1 lb

## 2015-04-04 DIAGNOSIS — I259 Chronic ischemic heart disease, unspecified: Secondary | ICD-10-CM | POA: Diagnosis not present

## 2015-04-04 DIAGNOSIS — I10 Essential (primary) hypertension: Secondary | ICD-10-CM

## 2015-04-04 DIAGNOSIS — E785 Hyperlipidemia, unspecified: Secondary | ICD-10-CM | POA: Diagnosis not present

## 2015-04-04 LAB — HEPATIC FUNCTION PANEL
ALT: 23 U/L (ref 0–53)
AST: 25 U/L (ref 0–37)
Albumin: 4.1 g/dL (ref 3.5–5.2)
Alkaline Phosphatase: 61 U/L (ref 39–117)
Bilirubin, Direct: 0.2 mg/dL (ref 0.0–0.3)
Total Bilirubin: 1.1 mg/dL (ref 0.2–1.2)
Total Protein: 6.9 g/dL (ref 6.0–8.3)

## 2015-04-04 LAB — BASIC METABOLIC PANEL
BUN: 24 mg/dL — ABNORMAL HIGH (ref 6–23)
CO2: 31 mEq/L (ref 19–32)
Calcium: 10.4 mg/dL (ref 8.4–10.5)
Chloride: 99 mEq/L (ref 96–112)
Creatinine, Ser: 1.12 mg/dL (ref 0.40–1.50)
GFR: 66.46 mL/min (ref >60.00)
Glucose, Bld: 109 mg/dL — ABNORMAL HIGH (ref 70–99)
Potassium: 3.8 mEq/L (ref 3.5–5.1)
Sodium: 135 mEq/L (ref 135–145)

## 2015-04-04 LAB — LIPID PANEL
Cholesterol: 174 mg/dL (ref 0–200)
HDL: 53.5 mg/dL (ref >39.00)
LDL Cholesterol: 100 mg/dL — ABNORMAL HIGH (ref 0–99)
NonHDL: 120.5
Total CHOL/HDL Ratio: 3
Triglycerides: 102 mg/dL (ref 0.0–149.0)
VLDL: 20.4 mg/dL (ref 0.0–40.0)

## 2015-04-04 NOTE — Progress Notes (Signed)
CARDIOLOGY OFFICE NOTE  Date:  04/04/2015    Witten Certain [id number] Date of Birth: 06-10-1929 Medical Record #[id number]  PCP:  Walker Kehr, MD  Cardiologist:  Aundra Dubin    Chief Complaint  Patient presents with  . Coronary Artery Disease    6 month check - seen for Dr. Aundra Dubin    History of Present Illness: Samuel Santiago is a 81 y.o. male who presents today for a 6 month check. Seen for Dr. Aundra Dubin - former patient of Dr. Susa Simmonds. Has known CAD with remote MI treated with thrombolysis in 1997, followed by 2 stents to the RCA. Last Myoview from 2012 was normal. Other issues include colon cancer in 1990 with colectomy and recurrence in 2003 treated with chemo, HTN, HLD and nephrolithiasis.   Last seen here in December - was doing ok - some concern with his gait. Saw PCP and started on Aricept for mild dementia. Statin dose cut back.   Comes back today. Here with his daughter today - Jan. Jan tells me that her mom has moved in with her - Damarkus now living alone - not clear as to why. Kids are helping with meals. Stopped the Aricept - thinks it upset his stomach - does not really remember. Seeing PCP later this month. No chest pain. Breathing ok. Not sleeping well. Had his cataract yesterday. This went ok. Has had a fall in his yard and scraped his left arm. Does not check his BP at home.   Past Medical History  Diagnosis Date  . Hyperlipidemia   . Colon cancer 1990    Had recurrence in 2003 resultinbg in chemotherapy. He recently had a colonoscopy and  had a polyp removed that was benign.  . Hypertension     probably related to lack of morning medications and stress of being at most at night in hospital with his wife  . Ischemic heart disease     has had remote MI in 1997 and was treated with TPA. Prior PCI to RCA with repeat PCI to the RCA in 2003  . Kidney stone     x11  . Normal nuclear stress test Feb 2012    No significant ischemia. EF 56%; with basal inferior scar  .  History of nephrolithiasis 01/23/2012  . Arthritis   . History of colon polyps   . GERD (gastroesophageal reflux disease)     Past Surgical History  Procedure Laterality Date  . Colonoscopy      colon cancer  . Coronary stent placement  1997 & 2003    PCI to RCA x 2  . Colon surgery      x 2 for cancer  . Eye surgery       Medications: Current Outpatient Prescriptions  Medication Sig Dispense Refill  . aspirin 81 MG tablet Take 81 mg by mouth daily.    . Cholecalciferol (VITAMIN D-3 PO) Take by mouth daily.    Marland Kitchen erythromycin ophthalmic ointment Place 1 application into both eyes 2 (two) times daily. 1 application 2 times daily. Both eyes    . hydrochlorothiazide (HYDRODIURIL) 25 MG tablet Take 1 tablet (25 mg total) by mouth daily. 90 tablet 3  . losartan (COZAAR) 50 MG tablet Take 1 tablet (50 mg total) by mouth daily. 90 tablet 3  . Multiple Vitamin (MULTIVITAMIN WITH MINERALS) TABS tablet Take 1 tablet by mouth daily.    . Omega-3 Fatty Acids (FISH OIL PO) Take 1 capsule by mouth daily.    Marland Kitchen  omeprazole (PRILOSEC) 20 MG capsule Take 1 capsule (20 mg total) by mouth daily. 90 capsule 3  . polyvinyl alcohol-povidone (REFRESH) 1.4-0.6 % ophthalmic solution Place 1 drop into both eyes 4 (four) times daily. as needed (for dry eyes).    . simvastatin (ZOCOR) 40 MG tablet Take 0.5 tablets (20 mg total) by mouth at bedtime. 90 tablet 3  . [DISCONTINUED] omeprazole (PRILOSEC OTC) 20 MG tablet Take 1 tablet (20 mg total) by mouth daily. 90 tablet 3   No current facility-administered medications for this visit.    Allergies: Allergies  Allergen Reactions  . Tape Other (See Comments)    Pulled off skin one time--- "it really sticks to me" but will probably tolerate paper tape     Social History: The patient  reports that he quit smoking about 29 years ago. He has never used smokeless tobacco. He reports that he does not drink alcohol or use illicit drugs.   Family History: The  patient's family history includes Arthritis in his sister; Breast cancer in his sister; Colon polyps in his daughter and son.   Review of Systems: Please see the history of present illness.   Otherwise, the review of systems is positive for none.   All other systems are reviewed and negative.   Physical Exam: VS:  BP 160/90 mmHg  Pulse 74  Ht 5\' 6"  (1.676 m)  Wt 173 lb 1.9 oz (78.527 kg)  BMI 27.96 kg/m2 .  BMI Body mass index is 27.96 kg/(m^2).  Wt Readings from Last 3 Encounters:  04/04/15 173 lb 1.9 oz (78.527 kg)  10/18/14 174 lb (78.926 kg)  09/26/14 179 lb 12.8 oz (81.557 kg)    Recheck BP by me is 140/80  General: Pleasant. Elderly male who isin no acute distress. He has lost 6 pounds.  HEENT: Normal. Neck: Supple, no JVD, carotid bruits, or masses noted.  Cardiac: Regular rate and rhythm. No murmurs, rubs, or gallops. No edema.  Respiratory:  Lungs are clear to auscultation bilaterally with normal work of breathing.  GI: Soft and nontender.  MS: No deformity or atrophy. Gait and ROM intact. Skin: Warm and dry. Color is normal.  Neuro:  Strength and sensation are intact and no gross focal deficits noted.  Psych: Alert, appropriate and with normal affect. He notes that his memory is not good - looks to his daughter for answers.    LABORATORY DATA:  EKG:  EKG is ordered today. This shows NSR today.     Lab Results  Component Value Date   WBC 8.4 12/30/2013   HGB 15.2 12/30/2013   HCT 44.5 12/30/2013   PLT 161.0 12/30/2013   GLUCOSE 103* 12/30/2013   CHOL 155 12/30/2013   TRIG 135.0 12/30/2013   HDL 39.70 12/30/2013   LDLCALC 88 12/30/2013   ALT 26 12/30/2013   AST 26 12/30/2013   NA 139 12/30/2013   K 3.5 12/30/2013   CL 103 12/30/2013   CREATININE 1.2 12/30/2013   BUN 22 12/30/2013   CO2 30 12/30/2013   TSH 1.04 12/30/2013   PSA 1.12 01/27/2013    BNP (last 3 results) No results for input(s): BNP in the last 8760 hours.  ProBNP (last 3 results) No  results for input(s): PROBNP in the last 8760 hours.   Other Studies Reviewed Today:   Assessment/Plan: 1. CAD - no symptoms noted. Continue with current regimen.  2. HTN - BP fair - not checking at home. Probably getting too much salt. Will  leave on current regimen for now. Encouraged him to let his kids "spot check" his pill box to make sure his medicines are right  3. HLD - on statin - checking labs today   4. Early dementia - stopped Aricept - seeing PCP later this month  5. Situation stress - wife has moved out - not clear as to what has precipitated this - he tells me he does not know.   Current medicines are reviewed with the patient today.  The patient does not have concerns regarding medicines other than what has been noted above.  The following changes have been made:  See above.  Labs/ tests ordered today include:    Orders Placed This Encounter  Procedures  . Basic metabolic panel  . Hepatic function panel  . Lipid panel  . EKG 12-Lead     Disposition:   FU with me in 4  months. Redo his DPR form today.    Patient is agreeable to this plan and will call if any problems develop in the interim.   Signed: Burtis Junes, RN, ANP-C 04/04/2015 11:48 AM  [place] [address] [place], [place]  [address] Phone: [phone] Fax: [phone]

## 2015-04-04 NOTE — Patient Instructions (Addendum)
We will be checking the following labs today - BMET, Lipids, HPF  Medication Instructions:    Continue with your current medicines.     Testing/Procedures To Be Arranged:  N/A  Follow-Up:   I will see you back in 4 months    Other Special Instructions:   Redo DPR form  Call the [facility] [place] office at [phone] if you have any questions, problems or concerns.

## 2015-04-19 ENCOUNTER — Encounter: Payer: Self-pay | Admitting: Internal Medicine

## 2015-04-19 ENCOUNTER — Ambulatory Visit (INDEPENDENT_AMBULATORY_CARE_PROVIDER_SITE_OTHER): Payer: Medicare HMO | Admitting: Internal Medicine

## 2015-04-19 VITALS — BP 138/76 | HR 76 | Wt 171.0 lb

## 2015-04-19 DIAGNOSIS — I2510 Atherosclerotic heart disease of native coronary artery without angina pectoris: Secondary | ICD-10-CM | POA: Diagnosis not present

## 2015-04-19 DIAGNOSIS — E785 Hyperlipidemia, unspecified: Secondary | ICD-10-CM

## 2015-04-19 DIAGNOSIS — S40812A Abrasion of left upper arm, initial encounter: Secondary | ICD-10-CM | POA: Insufficient documentation

## 2015-04-19 DIAGNOSIS — R270 Ataxia, unspecified: Secondary | ICD-10-CM

## 2015-04-19 DIAGNOSIS — R413 Other amnesia: Secondary | ICD-10-CM | POA: Diagnosis not present

## 2015-04-19 DIAGNOSIS — I10 Essential (primary) hypertension: Secondary | ICD-10-CM

## 2015-04-19 DIAGNOSIS — R4189 Other symptoms and signs involving cognitive functions and awareness: Secondary | ICD-10-CM | POA: Insufficient documentation

## 2015-04-19 NOTE — Progress Notes (Signed)
    Subjective:    C/o fatige; fell 4 times in the past 6 mo F/u ectropion - s/p B surgery -- Dr Lorin Mercy in [place] - better The patient presents for a follow-up of  chronic hypertension, chronic dyslipidemia,  controlled with medicines, elev MCV.  F/u insomnia, dementia. BP is good at home  BP Readings from Last 3 Encounters:  04/19/15 138/76  04/04/15 160/90  10/18/14 160/100    Wt Readings from Last 3 Encounters:  04/19/15 171 lb (77.565 kg)  04/04/15 173 lb 1.9 oz (78.527 kg)  10/18/14 174 lb (78.926 kg)      Review of Systems  Constitutional: Negative for appetite change, fatigue and unexpected weight change.  HENT: Negative for nosebleeds, congestion, sore throat, sneezing, trouble swallowing and neck pain.   Eyes: Negative for itching and visual disturbance.  Respiratory: Negative for cough.   Cardiovascular: Negative for chest pain, palpitations and leg swelling.  Gastrointestinal: Negative for nausea, diarrhea, blood in stool and abdominal distention.  Genitourinary: Negative for frequency and hematuria.  Musculoskeletal: Negative for back pain, joint swelling and gait problem.  Skin: Negative for rash.  Neurological: Negative for dizziness, tremors, speech difficulty and weakness.  Psychiatric/Behavioral: Negative for sleep disturbance, dysphoric mood and agitation. The patient is not nervous/anxious.        Objective:   Physical Exam  Constitutional: He is oriented to person, place, and time. He appears well-developed.  HENT:  Mouth/Throat: Oropharynx is clear and moist.  Eyes: Conjunctivae are normal. Pupils are equal, round, and reactive to light.  Neck: Normal range of motion. No JVD present. No thyromegaly present.  Cardiovascular: Normal rate, regular rhythm, normal heart sounds and intact distal pulses.  Exam reveals no gallop and no friction rub.   No murmur heard. Pulmonary/Chest: Effort normal and breath sounds normal. No respiratory distress. He has no  wheezes. He has no rales. He exhibits no tenderness.  Abdominal: Soft. Bowel sounds are normal. He exhibits no distension and no mass. There is no tenderness. There is no rebound and no guarding.  Musculoskeletal: Normal range of motion. He exhibits no edema and no tenderness.  Lymphadenopathy:    He has no cervical adenopathy.  Neurological: He is alert and oriented to person, place, and time. He has normal reflexes. No cranial nerve deficit. He exhibits normal muscle tone. Coordination normal.  Skin: Skin is warm and dry. No rash noted.  Psychiatric: He has a normal mood and affect. His behavior is normal. Judgment and thought content normal.    Ataxic - using a cane at home Alert, oriented x3 wsome hesitancy Serial 7s - 3 correct  Large L arm abrasion - healing    Lab Results  Component Value Date   WBC 8.4 12/30/2013   HGB 15.2 12/30/2013   HCT 44.5 12/30/2013   PLT 161.0 12/30/2013   GLUCOSE 109* 04/04/2015   CHOL 174 04/04/2015   TRIG 102.0 04/04/2015   HDL 53.50 04/04/2015   LDLCALC 100* 04/04/2015   ALT 23 04/04/2015   AST 25 04/04/2015   NA 135 04/04/2015   K 3.8 04/04/2015   CL 99 04/04/2015   CREATININE 1.12 04/04/2015   BUN 24* 04/04/2015   CO2 31 04/04/2015   TSH 1.04 12/30/2013   PSA 1.12 01/27/2013         Assessment & Plan:

## 2015-04-19 NOTE — Assessment & Plan Note (Signed)
Re-dressed 

## 2015-04-19 NOTE — Assessment & Plan Note (Addendum)
Chronic BP is nl at home HCTZ, Losartan

## 2015-04-19 NOTE — Assessment & Plan Note (Signed)
Doing better now

## 2015-04-19 NOTE — Progress Notes (Signed)
Pre visit review using our clinic review tool, if applicable. No additional management support is needed unless otherwise documented below in the visit note. 

## 2015-04-19 NOTE — Assessment & Plan Note (Signed)
Use a cane A "driving test" w/son

## 2015-04-19 NOTE — Assessment & Plan Note (Signed)
On simvastatin.   

## 2015-04-19 NOTE — Assessment & Plan Note (Signed)
Mild, age related Better

## 2015-04-19 NOTE — Assessment & Plan Note (Signed)
Simvastatin, ASA 

## 2015-04-30 ENCOUNTER — Other Ambulatory Visit: Payer: Self-pay | Admitting: Cardiology

## 2015-04-30 MED ORDER — LOSARTAN POTASSIUM 50 MG PO TABS: 50.0000 mg | ORAL_TABLET | Freq: Every day | ORAL | Status: DC

## 2015-06-06 ENCOUNTER — Other Ambulatory Visit: Payer: Self-pay | Admitting: Internal Medicine

## 2015-08-01 ENCOUNTER — Encounter: Payer: Self-pay | Admitting: Nurse Practitioner

## 2015-08-01 ENCOUNTER — Ambulatory Visit (INDEPENDENT_AMBULATORY_CARE_PROVIDER_SITE_OTHER): Payer: Medicare HMO | Admitting: Nurse Practitioner

## 2015-08-01 VITALS — BP 150/80 | HR 86 | Ht 66.0 in | Wt 180.8 lb

## 2015-08-01 DIAGNOSIS — I10 Essential (primary) hypertension: Secondary | ICD-10-CM | POA: Diagnosis not present

## 2015-08-01 DIAGNOSIS — E785 Hyperlipidemia, unspecified: Secondary | ICD-10-CM

## 2015-08-01 DIAGNOSIS — I259 Chronic ischemic heart disease, unspecified: Secondary | ICD-10-CM | POA: Diagnosis not present

## 2015-08-01 MED ORDER — ESCITALOPRAM OXALATE 10 MG PO TABS: 5.0000 mg | ORAL_TABLET | Freq: Every day | ORAL | Status: DC
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
## 2015-08-01 NOTE — Patient Instructions (Addendum)
We will be checking the following labs today - NONE   Medication Instructions:    Continue with your current medicines.   We are adding low dose Lexapro 10 mg - start with just 1/2 tablet daily and may increase to a whole tablet in 2 weeks.     Testing/Procedures To Be Arranged:  N/A  Follow-Up:   See me in 6 months    Other Special Instructions:   N/A  Call the [facility] office at [phone] if you have any questions, problems or concerns.

## 2015-08-20 ENCOUNTER — Encounter: Payer: Self-pay | Admitting: Internal Medicine

## 2015-08-20 ENCOUNTER — Other Ambulatory Visit (INDEPENDENT_AMBULATORY_CARE_PROVIDER_SITE_OTHER): Payer: Medicare HMO

## 2015-08-20 ENCOUNTER — Ambulatory Visit (INDEPENDENT_AMBULATORY_CARE_PROVIDER_SITE_OTHER): Payer: Medicare HMO | Admitting: Internal Medicine

## 2015-08-20 VITALS — BP 140/80 | HR 74 | Wt 180.0 lb

## 2015-08-20 DIAGNOSIS — E785 Hyperlipidemia, unspecified: Secondary | ICD-10-CM

## 2015-08-20 DIAGNOSIS — M19041 Primary osteoarthritis, right hand: Secondary | ICD-10-CM | POA: Diagnosis not present

## 2015-08-20 DIAGNOSIS — M19042 Primary osteoarthritis, left hand: Secondary | ICD-10-CM

## 2015-08-20 DIAGNOSIS — I10 Essential (primary) hypertension: Secondary | ICD-10-CM | POA: Diagnosis not present

## 2015-08-20 DIAGNOSIS — I2510 Atherosclerotic heart disease of native coronary artery without angina pectoris: Secondary | ICD-10-CM

## 2015-08-20 DIAGNOSIS — R413 Other amnesia: Secondary | ICD-10-CM | POA: Diagnosis not present

## 2015-08-20 LAB — BASIC METABOLIC PANEL
BUN: 22 mg/dL (ref 6–23)
CALCIUM: 10.3 mg/dL (ref 8.4–10.5)
CO2: 31 mEq/L (ref 19–32)
CREATININE: 1.08 mg/dL (ref 0.40–1.50)
Chloride: 101 mEq/L (ref 96–112)
GFR: 69.24 mL/min (ref >60.00)
GLUCOSE: 93 mg/dL (ref 70–99)
Potassium: 3.9 mEq/L (ref 3.5–5.1)
Sodium: 139 mEq/L (ref 135–145)

## 2015-08-20 NOTE — Assessment & Plan Note (Signed)
BP is nl at home HCTZ, Losartan

## 2015-08-20 NOTE — Progress Notes (Signed)
Subjective:  Patient ID: Samuel Santiago, male    DOB: Dec 08, 1928  Age: 81 y.o. MRN: [id number]  CC: No chief complaint on file.   HPI Samuel Santiago KDXIPJA presents for HTN, dyslipidemia, anxiety f./u  Outpatient Prescriptions Prior to Visit  Medication Sig Dispense Refill  . aspirin 81 MG tablet Take 325 mg by mouth daily.     . Cholecalciferol (VITAMIN D-3 PO) Take by mouth daily.    Marland Kitchen erythromycin ophthalmic ointment Place 1 application into both eyes 2 (two) times daily. 1 application 2 times daily. Both eyes    . escitalopram (LEXAPRO) 10 MG tablet Take 0.5 tablets (5 mg total) by mouth daily. May increase to 10 mg in 2 weeks 30 tablet 11  . losartan (COZAAR) 50 MG tablet Take 1 tablet (50 mg total) by mouth daily. 90 tablet 2  . Multiple Vitamin (MULTIVITAMIN WITH MINERALS) TABS tablet Take 1 tablet by mouth daily.    . Omega-3 Fatty Acids (FISH OIL PO) Take 1 capsule by mouth daily.    Marland Kitchen omeprazole (PRILOSEC) 20 MG capsule TAKE 1 CAPSULE BY MOUTH DAILY 90 capsule 3  . polyvinyl alcohol-povidone (REFRESH) 1.4-0.6 % ophthalmic solution Place 1 drop into both eyes 4 (four) times daily. as needed (for dry eyes).    . simvastatin (ZOCOR) 40 MG tablet Take 0.5 tablets (20 mg total) by mouth at bedtime. 90 tablet 3  . hydrochlorothiazide (HYDRODIURIL) 25 MG tablet Take 1 tablet (25 mg total) by mouth daily. 90 tablet 3   No facility-administered medications prior to visit.    ROS Review of Systems  Constitutional: Negative for appetite change, fatigue and unexpected weight change.  HENT: Negative for congestion, nosebleeds, sneezing, sore throat and trouble swallowing.   Eyes: Negative for itching and visual disturbance.  Respiratory: Negative for cough.   Cardiovascular: Negative for chest pain, palpitations and leg swelling.  Gastrointestinal: Negative for nausea, diarrhea, blood in stool and abdominal distention.  Genitourinary: Negative for frequency and hematuria.    Musculoskeletal: Negative for back pain, joint swelling, gait problem and neck pain.  Skin: Negative for rash.  Neurological: Negative for dizziness, tremors, speech difficulty and weakness.  Psychiatric/Behavioral: Negative for sleep disturbance, dysphoric mood and agitation. The patient is not nervous/anxious.     Objective:  BP 140/80 mmHg  Pulse 74  Wt 180 lb (81.647 kg)  SpO2 94%  BP Readings from Last 3 Encounters:  08/20/15 140/80  08/01/15 150/80  04/19/15 138/76    Wt Readings from Last 3 Encounters:  08/20/15 180 lb (81.647 kg)  08/01/15 180 lb 12.8 oz (82.01 kg)  04/19/15 171 lb (77.565 kg)    Physical Exam  Constitutional: He is oriented to person, place, and time. He appears well-developed. No distress.  NAD  HENT:  Mouth/Throat: Oropharynx is clear and moist.  Eyes: Conjunctivae are normal. Pupils are equal, round, and reactive to light. Left eye exhibits no discharge.  Neck: Normal range of motion. No JVD present. No thyromegaly present.  Cardiovascular: Normal rate, regular rhythm, normal heart sounds and intact distal pulses.  Exam reveals no gallop and no friction rub.   No murmur heard. Pulmonary/Chest: Effort normal and breath sounds normal. No respiratory distress. He has no wheezes. He has no rales. He exhibits no tenderness.  Abdominal: Soft. Bowel sounds are normal. He exhibits no distension and no mass. There is no tenderness. There is no rebound and no guarding.  Musculoskeletal: Normal range of motion. He exhibits no edema or  tenderness.  Lymphadenopathy:    He has no cervical adenopathy.  Neurological: He is alert and oriented to person, place, and time. He has normal reflexes. No cranial nerve deficit. He exhibits normal muscle tone. He displays a negative Romberg sign. Coordination and gait normal.  Skin: Skin is warm and dry. No rash noted.  Psychiatric: He has a normal mood and affect. His behavior is normal. Judgment and thought content  normal.  eyelids red Big toe toenails are thick - trimmed Cane Lab Results  Component Value Date   WBC 8.4 12/30/2013   HGB 15.2 12/30/2013   HCT 44.5 12/30/2013   PLT 161.0 12/30/2013   GLUCOSE 109* 04/04/2015   CHOL 174 04/04/2015   TRIG 102.0 04/04/2015   HDL 53.50 04/04/2015   LDLCALC 100* 04/04/2015   ALT 23 04/04/2015   AST 25 04/04/2015   NA 135 04/04/2015   K 3.8 04/04/2015   CL 99 04/04/2015   CREATININE 1.12 04/04/2015   BUN 24* 04/04/2015   CO2 31 04/04/2015   TSH 1.04 12/30/2013   PSA 1.12 01/27/2013    Ct Abdomen Pelvis Wo Contrast  05/27/2013  *RADIOLOGY REPORT* Clinical Data: Right flank and right lower quadrant abdominal pain. History of kidney stones. CT ABDOMEN AND PELVIS WITHOUT CONTRAST Technique:  Multidetector CT imaging of the abdomen and pelvis was performed following the standard protocol without intravenous contrast. Comparison: None. Findings: Mild atelectasis is present at both lung bases.  There is no significant pleural or pericardial effusion.  A moderate sized hiatal hernia is noted.  There are coronary artery calcifications. Both kidneys are distorted by multiple large renal cysts bilaterally.  These measure up to 15.8 cm on the right (image 28) and 9.2 cm on the left (image 32).  Within the proximal right ureter is a 3 mm calculus on image 47. There is only mild dilatation of the right renal pelvis.  No significant perinephric soft tissue stranding is identified.  There are no definite renal calculi. The liver, spleen, adrenal glands and pancreas appear normal aside from mild pancreatic atrophy.  There is a 1.6 cm calcified gallstone.  No gallbladder wall thickening or significant biliary dilatation is seen. There are surgical clips in the right abdomen consistent with the partial colectomy.  Other clips are present in the pelvis consistent with distal colon resection and anastomoses.  The transverse colon is distended and stool-filled.  No inflammatory  changes or enlarged lymph nodes are seen.  There is mild aorto iliac atherosclerosis.  The prostate gland is moderately enlarged. There are no acute osseous findings. IMPRESSION: 1.  Partially obstructing 3 mm calculus in the proximal right ureter. 2.  Multiple large renal cysts bilaterally. 3.  Calcified gallstone. 4.  Moderate colonic dilatation status post partial colon resection.  No evidence of bowel obstruction. Original Report Authenticated By: Richardean Sale, M.D.    Assessment & Plan:   There are no diagnoses linked to this encounter. I am having Mr. Leighton maintain his multivitamin with minerals, Omega-3 Fatty Acids (FISH OIL PO), Cholecalciferol (VITAMIN D-3 PO), hydrochlorothiazide, erythromycin, polyvinyl alcohol-povidone, simvastatin, aspirin, losartan, omeprazole, and escitalopram.  No orders of the defined types were placed in this encounter.     Follow-up: No Follow-up on file.  Walker Kehr, MD

## 2015-08-20 NOTE — Progress Notes (Signed)
Pre visit review using our clinic review tool, if applicable. No additional management support is needed unless otherwise documented below in the visit note. 

## 2015-08-20 NOTE — Assessment & Plan Note (Signed)
On Simvastatin 

## 2015-08-20 NOTE — Assessment & Plan Note (Signed)
On Lexapro - better

## 2015-08-20 NOTE — Assessment & Plan Note (Signed)
Simvastatin, ASA Labs

## 2015-08-20 NOTE — Assessment & Plan Note (Signed)
Using a cane or a walker

## 2015-09-06 ENCOUNTER — Other Ambulatory Visit: Payer: Self-pay | Admitting: Cardiology

## 2015-09-18 ENCOUNTER — Encounter (HOSPITAL_COMMUNITY): Payer: Self-pay | Admitting: Emergency Medicine

## 2015-09-18 ENCOUNTER — Inpatient Hospital Stay (HOSPITAL_COMMUNITY)
Admission: EM | Admit: 2015-09-18 | Discharge: 2015-09-19 | DRG: 309 | Disposition: A | Discharge status: Home Health | Payer: Medicare HMO | Attending: Internal Medicine | Admitting: Internal Medicine

## 2015-09-18 ENCOUNTER — Emergency Department (HOSPITAL_COMMUNITY): Payer: Medicare HMO

## 2015-09-18 ENCOUNTER — Telehealth: Payer: Self-pay | Admitting: *Deleted

## 2015-09-18 DIAGNOSIS — Z7982 Long term (current) use of aspirin: Secondary | ICD-10-CM

## 2015-09-18 DIAGNOSIS — E785 Hyperlipidemia, unspecified: Secondary | ICD-10-CM | POA: Diagnosis not present

## 2015-09-18 DIAGNOSIS — Z85038 Personal history of other malignant neoplasm of large intestine: Secondary | ICD-10-CM

## 2015-09-18 DIAGNOSIS — Z79899 Other long term (current) drug therapy: Secondary | ICD-10-CM

## 2015-09-18 DIAGNOSIS — Z9221 Personal history of antineoplastic chemotherapy: Secondary | ICD-10-CM | POA: Diagnosis not present

## 2015-09-18 DIAGNOSIS — K219 Gastro-esophageal reflux disease without esophagitis: Secondary | ICD-10-CM | POA: Diagnosis present

## 2015-09-18 DIAGNOSIS — I2510 Atherosclerotic heart disease of native coronary artery without angina pectoris: Secondary | ICD-10-CM | POA: Diagnosis present

## 2015-09-18 DIAGNOSIS — Z955 Presence of coronary angioplasty implant and graft: Secondary | ICD-10-CM | POA: Diagnosis not present

## 2015-09-18 DIAGNOSIS — R4189 Other symptoms and signs involving cognitive functions and awareness: Secondary | ICD-10-CM | POA: Diagnosis not present

## 2015-09-18 DIAGNOSIS — R000 Tachycardia, unspecified: Secondary | ICD-10-CM | POA: Diagnosis not present

## 2015-09-18 DIAGNOSIS — K30 Functional dyspepsia: Secondary | ICD-10-CM | POA: Diagnosis present

## 2015-09-18 DIAGNOSIS — Z87891 Personal history of nicotine dependence: Secondary | ICD-10-CM

## 2015-09-18 DIAGNOSIS — I252 Old myocardial infarction: Secondary | ICD-10-CM | POA: Diagnosis not present

## 2015-09-18 DIAGNOSIS — I10 Essential (primary) hypertension: Secondary | ICD-10-CM | POA: Diagnosis not present

## 2015-09-18 DIAGNOSIS — R51 Headache: Secondary | ICD-10-CM | POA: Diagnosis not present

## 2015-09-18 DIAGNOSIS — M1990 Unspecified osteoarthritis, unspecified site: Secondary | ICD-10-CM | POA: Diagnosis present

## 2015-09-18 DIAGNOSIS — I480 Paroxysmal atrial fibrillation: Principal | ICD-10-CM | POA: Diagnosis present

## 2015-09-18 DIAGNOSIS — I161 Hypertensive emergency: Secondary | ICD-10-CM | POA: Diagnosis not present

## 2015-09-18 DIAGNOSIS — I4891 Unspecified atrial fibrillation: Secondary | ICD-10-CM | POA: Diagnosis not present

## 2015-09-18 LAB — TROPONIN I

## 2015-09-18 LAB — BASIC METABOLIC PANEL
Anion gap: 13 (ref 5–15)
BUN: 16 mg/dL (ref 6–20)
CO2: 24 mmol/L (ref 22–32)
Calcium: 9.8 mg/dL (ref 8.9–10.3)
Chloride: 99 mmol/L — ABNORMAL LOW (ref 101–111)
Creatinine, Ser: 1.06 mg/dL (ref 0.61–1.24)
GFR calc Af Amer: >60 mL/min (ref >60)
GLUCOSE: 153 mg/dL — AB (ref 65–99)
Potassium: 3.7 mmol/L (ref 3.5–5.1)
Sodium: 136 mmol/L (ref 135–145)

## 2015-09-18 LAB — URINE MICROSCOPIC-ADD ON

## 2015-09-18 LAB — CBC
HEMATOCRIT: 46.5 % (ref 39.0–52.0)
HEMOGLOBIN: 16.5 g/dL (ref 13.0–17.0)
MCH: 35.4 pg — AB (ref 26.0–34.0)
MCHC: 35.5 g/dL (ref 30.0–36.0)
MCV: 99.8 fL (ref 78.0–100.0)
Platelets: 152 10*3/uL (ref 150–400)
RBC: 4.66 MIL/uL (ref 4.22–5.81)
RDW: 12.8 % (ref 11.5–15.5)
WBC: 11.6 10*3/uL — ABNORMAL HIGH (ref 4.0–10.5)

## 2015-09-18 LAB — URINALYSIS, ROUTINE W REFLEX MICROSCOPIC
Bilirubin Urine: NEGATIVE
GLUCOSE, UA: 100 mg/dL — AB
Ketones, ur: 15 mg/dL — AB
Leukocytes, UA: NEGATIVE
Nitrite: NEGATIVE
PH: 7 (ref 5.0–8.0)
Protein, ur: 100 mg/dL — AB
SPECIFIC GRAVITY, URINE: 1.016 (ref 1.005–1.030)

## 2015-09-18 MED ORDER — DILTIAZEM HCL 100 MG IV SOLR
5.0000 mg/h | INTRAVENOUS | Status: DC
  Administered 2015-09-18: 5 mg/h via INTRAVENOUS
  Filled 2015-09-18: qty 100

## 2015-09-18 MED ORDER — DILTIAZEM LOAD VIA INFUSION
20.0000 mg | Freq: Once | INTRAVENOUS | Status: AC
  Administered 2015-09-18: 20 mg via INTRAVENOUS
  Filled 2015-09-18: qty 20

## 2015-09-18 MED ORDER — ONDANSETRON 4 MG PO TBDP
4.0000 mg | ORAL_TABLET | Freq: Once | ORAL | Status: AC
  Administered 2015-09-18: 4 mg via ORAL
  Filled 2015-09-18: qty 1

## 2015-09-18 NOTE — ED Notes (Signed)
EKG given to Dr Christy Gentles notified of tachy heart rate

## 2015-09-18 NOTE — ED Notes (Signed)
Pt states around 345 he had a sudden onset of a headache ,nauseous and vomiting. Wife took his bp and stated it was 204/101. Pt states he still has a slight headache but no nauseous at this time.

## 2015-09-18 NOTE — ED Notes (Signed)
Janett Billow - RN aware of pt's BP.

## 2015-09-18 NOTE — ED Provider Notes (Signed)
CSN: [id number]     Arrival date & time 09/18/15  1702 History   First MD Initiated Contact with Patient 09/18/15 1924     Chief Complaint  Patient presents with  . Hypertension      Patient is a 81 y.o. male presenting with hypertension. The history is provided by the patient and the spouse.  Hypertension This is a recurrent problem. The current episode started 3 to 5 hours ago. The problem occurs constantly. The problem has been gradually worsening. Associated symptoms include headaches. Pertinent negatives include no chest pain. Nothing aggravates the symptoms. Nothing relieves the symptoms.  per wife, pt was acting "fidgety" earlier today and very anxious.  He developed and headache and nausea/vomiting.  She reports she checked his BP several times and he had HTN.  She reports he also had some difficulty speaking but no focal weakness.  No LOC.  He is now at baseline He had otherwise been well recently He denies any CP at this time  No h/o CVA No h/o atrial fibrillation  Past Medical History  Diagnosis Date  . Hyperlipidemia   . Colon cancer ([place]) 1990    Had recurrence in 2003 resultinbg in chemotherapy. He recently had a colonoscopy and  had a polyp removed that was benign.  . Hypertension     probably related to lack of morning medications and stress of being at most at night in hospital with his wife  . Ischemic heart disease     has had remote MI in 1997 and was treated with TPA. Prior PCI to RCA with repeat PCI to the RCA in 2003  . Kidney stone     x11  . Normal nuclear stress test Feb 2012    No significant ischemia. EF 56%; with basal inferior scar  . History of nephrolithiasis 01/23/2012  . Arthritis   . History of colon polyps   . GERD (gastroesophageal reflux disease)    Past Surgical History  Procedure Laterality Date  . Colonoscopy      colon cancer  . Coronary stent placement  1997 & 2003    PCI to RCA x 2  . Colon surgery      x 2 for cancer  . Eye  surgery     Family History  Problem Relation Age of Onset  . Arthritis Sister   . Breast cancer Sister   . Colon polyps Son   . Colon polyps Daughter    Social History  Substance Use Topics  . Smoking status: Former Smoker    Quit date: 10/20/1985  . Smokeless tobacco: Never Used  . Alcohol Use: No    Review of Systems  Constitutional: Negative for fever.  Cardiovascular: Negative for chest pain.  Gastrointestinal: Positive for vomiting.  Neurological: Positive for speech difficulty and headaches.  Psychiatric/Behavioral: Positive for agitation. The patient is nervous/anxious.   All other systems reviewed and are negative.     Allergies  Tape  Home Medications   Prior to Admission medications   Medication Sig Start Date End Date Taking? Authorizing Provider  aspirin 81 MG tablet Take 325 mg by mouth daily.     Historical Provider, MD  Cholecalciferol (VITAMIN D-3 PO) Take by mouth daily.    Historical Provider, MD  erythromycin ophthalmic ointment Place 1 application into both eyes 2 (two) times daily. 1 application 2 times daily. Both eyes 08/02/14   Historical Provider, MD  escitalopram (LEXAPRO) 10 MG tablet Take 0.5 tablets (5 mg total)  by mouth daily. May increase to 10 mg in 2 weeks 08/01/15   Burtis Junes, NP  hydrochlorothiazide (HYDRODIURIL) 25 MG tablet TAKE 1 TABLET BY MOUTH ONCE DAILY 09/06/15   Larey Dresser, MD  losartan (COZAAR) 50 MG tablet Take 1 tablet (50 mg total) by mouth daily. 04/30/15   Larey Dresser, MD  Multiple Vitamin (MULTIVITAMIN WITH MINERALS) TABS tablet Take 1 tablet by mouth daily.    Historical Provider, MD  Omega-3 Fatty Acids (FISH OIL PO) Take 1 capsule by mouth daily.    Historical Provider, MD  omeprazole (PRILOSEC) 20 MG capsule TAKE 1 CAPSULE BY MOUTH DAILY 06/07/15   Aleksei Plotnikov V, MD  polyvinyl alcohol-povidone (REFRESH) 1.4-0.6 % ophthalmic solution Place 1 drop into both eyes 4 (four) times daily. as needed (for dry  eyes).    Historical Provider, MD  simvastatin (ZOCOR) 40 MG tablet Take 0.5 tablets (20 mg total) by mouth at bedtime. 10/18/14   Aleksei Plotnikov V, MD   BP 138/117 mmHg  Pulse 151  Temp(Src) 98.1 F (36.7 C) (Oral)  Resp 27  Wt 81.676 kg  SpO2 96% Physical Exam CONSTITUTIONAL: Well developed/well nourished HEAD: Normocephalic/atraumatic EYES: EOMI ENMT: Mucous membranes moist NECK: supple no meningeal signs CV: tachycardic and irregular LUNGS: coarse BS noted bilaterally, no distress noted ABDOMEN: soft, nontender, no rebound or guarding, bowel sounds noted throughout abdomen GU:no cva tenderness NEURO: Pt is awake/alert/appropriate, moves all extremitiesx4.  No facial droop.  No arm/leg drift EXTREMITIES: pulses normal/equal, full ROM SKIN: warm, color normal PSYCH: no abnormalities of mood noted, alert and oriented to situation   ED Course  Procedures  CRITICAL CARE Performed by: Sharyon Cable Total critical care time: 33 minutes Critical care time was exclusive of separately billable procedures and treating other patients. Critical care was necessary to treat or prevent imminent or life-threatening deterioration. Critical care was time spent personally by me on the following activities: development of treatment plan with patient and/or surrogate as well as nursing, discussions with consultants, evaluation of patient's response to treatment, examination of patient, obtaining history from patient or surrogate, ordering and performing treatments and interventions, ordering and review of laboratory studies, ordering and review of radiographic studies, pulse oximetry and re-evaluation of patient's condition. PATIENT WITH ATRIAL FIBRILLATION WITH RAPID HEART RATE REQUIRING IV CARDIZEM  This patients CHA2DS2-VASc Score and unadjusted Ischemic Stroke Rate (% per year) is equal to 4.8 % stroke rate/year from a score of 4  Above score calculated as 1 point each if present [CHF,  HTN, DM, Vascular=MI/PAD/Aortic Plaque, Age if 65-74, or Male] Above score calculated as 2 points each if present [Age > 75, or Stroke/TIA/TE]   Labs Review Labs Reviewed  BASIC METABOLIC PANEL - Abnormal; Notable for the following:    Chloride 99 (*)    Glucose, Bld 153 (*)    All other components within normal limits  CBC - Abnormal; Notable for the following:    WBC 11.6 (*)    MCH 35.4 (*)    All other components within normal limits  URINALYSIS, ROUTINE W REFLEX MICROSCOPIC (NOT AT [facility]) - Abnormal; Notable for the following:    Glucose, UA 100 (*)    Hgb urine dipstick TRACE (*)    Ketones, ur 15 (*)    Protein, ur 100 (*)    All other components within normal limits  URINE MICROSCOPIC-ADD ON - Abnormal; Notable for the following:    Squamous Epithelial / LPF 0-5 (*)  Bacteria, UA RARE (*)    All other components within normal limits  TROPONIN I    Imaging Review Dg Chest 2 View  09/18/2015  CLINICAL DATA:  Hypertension tachycardia EXAM: CHEST  2 VIEW COMPARISON:  None. FINDINGS: Mild cardiac enlargement and vascular congestion. No pulmonary edema consolidation or effusion. IMPRESSION: Cardiac enlargement with vascular congestion but no pulmonary edema. Electronically Signed   By: Skipper Cliche M.D.   On: 09/18/2015 20:14   Ct Head Wo Contrast  09/18/2015  CLINICAL DATA:  Headache EXAM: CT HEAD WITHOUT CONTRAST TECHNIQUE: Contiguous axial images were obtained from the base of the skull through the vertex without intravenous contrast. COMPARISON:  10/31/2012 FINDINGS: Moderate global atrophy. Chronic ischemic changes in the periventricular white matter. There is no mass effect, midline shift, or acute hemorrhage. Vessels are diffusely ectatic. Mastoid air cells and visualized paranasal sinuses are clear. IMPRESSION: No acute intracranial pathology. Electronically Signed   By: Marybelle Killings M.D.   On: 09/18/2015 20:38   I have personally reviewed and evaluated these  images and lab results as part of my medical decision-making.   EKG Interpretation   Date/Time:  Tuesday September 18 2015 19:21:01 EST Ventricular Rate:  119 PR Interval:    QRS Duration: 96 QT Interval:  329 QTC Calculation: 463 R Axis:   -30 Text Interpretation:  Atrial fibrillation Left axis deviation Abnormal  R-wave progression, early transition Nonspecific repol abnormality,  diffuse leads changed from prior afib is new Confirmed by Christy Gentles  MD,  Terree Gaultney ([id number]) on 09/18/2015 7:49:22 PM     Medications  diltiazem (CARDIZEM) 1 mg/mL load via infusion 20 mg (20 mg Intravenous Given 09/18/15 1948)    And  diltiazem (CARDIZEM) 100 mg in dextrose 5 % 100 mL (1 mg/mL) infusion (0 mg/hr Intravenous Stopped 09/18/15 2133)  ondansetron (ZOFRAN-ODT) disintegrating tablet 4 mg (4 mg Oral Given 09/18/15 2034)   7:56 PM Pt with HA/vomiting and possible aphasia earlier today.  This is now improving.  No focal weakness to suggest acute stroke.  However will obtain CT head.  He is also found to have new onset atrial fibrillation cardizem ordered He will need admission 9:55 PM Pt improved HR improved, but does have some hypotension with IV cardizem He is awake/alert/appropriate CT head negative Admitted to medical service D/w dr Humphrey Rolls with [facility] for [facility]  MDM   Final diagnoses:  Atrial fibrillation with rapid ventricular response [facility])    Nursing notes including past medical history and social history reviewed and considered in documentation xrays/imaging reviewed by myself and considered during evaluation Labs/vital reviewed myself and considered during evaluation     Ripley Fraise, MD 09/18/15 2156

## 2015-09-18 NOTE — H&P (Signed)
[facility] History and Physical  Samuel Santiago [id number] DOB: 1928-11-27 DOA: 09/18/2015  Referring physician: Ripley Fraise PCP: Walker Kehr, MD   Chief Complaint: Elevated BP  HPI: Samuel Santiago is a 81 y.o. male with history of HTN HLD IHD kidney stones presents to the ED with high blood pressures. Prior to lunch his wife states he complained of a headache. Patient was noted at that time to have an elevated blood pressure. His wife states she noted his heart rate was elevated at that time. Since she has atrial fibrillation she though he may also have it. She notified her PCP and was instructed to come into the ED. Patient has also had some nausea. He has vomited also. He has no chest pain noted. There is no shortness of breath noted. He has no syncope noted. He has no belly pain noted. No swelling of the legs noted. He has had on and off indigestion for which he took prilosec. Patient also takes an aspirin every day.   Review of Systems:  Constitutional:  No Fevers, chills, fatigue.  HEENT:  +headaches, no ear ache, nasal congestion, post nasal drip,  Cardio-vascular:  No chest pain, anasarca, dizziness, palpitations  GI:  No abdominal pain, diarrhea, loss of appetite  Resp:  No coughing up of blood.No wheezing.No chest wall deformity  Skin:  no rash or lesions.  GU:  no dysuria, change in color of urine  Musculoskeletal:  No joint pain or swelling. No back pain.  Psych:  No change in mood or affect.   Past Medical History  Diagnosis Date  . Hyperlipidemia   . Colon cancer ([place]) 1990    Had recurrence in 2003 resultinbg in chemotherapy. He recently had a colonoscopy and  had a polyp removed that was benign.  . Hypertension     probably related to lack of morning medications and stress of being at most at night in hospital with his wife  . Ischemic heart disease     has had remote MI in 1997 and was treated with TPA. Prior PCI to RCA with repeat PCI  to the RCA in 2003  . Kidney stone     x11  . Normal nuclear stress test Feb 2012    No significant ischemia. EF 56%; with basal inferior scar  . History of nephrolithiasis 01/23/2012  . Arthritis   . History of colon polyps   . GERD (gastroesophageal reflux disease)    Past Surgical History  Procedure Laterality Date  . Colonoscopy      colon cancer  . Coronary stent placement  1997 & 2003    PCI to RCA x 2  . Colon surgery      x 2 for cancer  . Eye surgery     Social History:  reports that he quit smoking about 29 years ago. He has never used smokeless tobacco. He reports that he does not drink alcohol or use illicit drugs.  Allergies  Allergen Reactions  . Tape Other (See Comments)    Pulled off skin one time--- "it really sticks to me" but will probably tolerate paper tape     Family History  Problem Relation Age of Onset  . Arthritis Sister   . Breast cancer Sister   . Colon polyps Son   . Colon polyps Daughter      Prior to Admission medications   Medication Sig Start Date End Date Taking? Authorizing Provider  aspirin 81 MG tablet Take 325 mg  by mouth daily.     Historical Provider, MD  Cholecalciferol (VITAMIN D-3 PO) Take by mouth daily.    Historical Provider, MD  erythromycin ophthalmic ointment Place 1 application into both eyes 2 (two) times daily. 1 application 2 times daily. Both eyes 08/02/14   Historical Provider, MD  escitalopram (LEXAPRO) 10 MG tablet Take 0.5 tablets (5 mg total) by mouth daily. May increase to 10 mg in 2 weeks 08/01/15   Burtis Junes, NP  hydrochlorothiazide (HYDRODIURIL) 25 MG tablet TAKE 1 TABLET BY MOUTH ONCE DAILY 09/06/15   Larey Dresser, MD  losartan (COZAAR) 50 MG tablet Take 1 tablet (50 mg total) by mouth daily. 04/30/15   Larey Dresser, MD  Multiple Vitamin (MULTIVITAMIN WITH MINERALS) TABS tablet Take 1 tablet by mouth daily.    Historical Provider, MD  Omega-3 Fatty Acids (FISH OIL PO) Take 1 capsule by mouth daily.     Historical Provider, MD  omeprazole (PRILOSEC) 20 MG capsule TAKE 1 CAPSULE BY MOUTH DAILY 06/07/15   Aleksei Plotnikov V, MD  polyvinyl alcohol-povidone (REFRESH) 1.4-0.6 % ophthalmic solution Place 1 drop into both eyes 4 (four) times daily. as needed (for dry eyes).    Historical Provider, MD  simvastatin (ZOCOR) 40 MG tablet Take 0.5 tablets (20 mg total) by mouth at bedtime. 10/18/14   Cassandria Anger, MD   Physical Exam: Filed Vitals:   09/18/15 2026 09/18/15 2030 09/18/15 2035 09/18/15 2040  BP: 143/95 147/92    Pulse: 90 98 101 50  Temp:      TempSrc:      Resp: 23 15 22 24   Weight:      SpO2: 94% 96% 94% 95%    Wt Readings from Last 3 Encounters:  09/18/15 81.676 kg (180 lb 1 oz)  08/20/15 81.647 kg (180 lb)  08/01/15 82.01 kg (180 lb 12.8 oz)    General:  Appears calm and comfortable Eyes: PERRL, normal lids, irises & conjunctiva ENT: grossly normal hearing, lips & tongue Neck: no LAD, masses or thyromegaly Cardiovascular: IRR, no m/r/g. No LE edema. Respiratory: CTA bilaterally, no w/r/r. Normal respiratory effort. Abdomen: soft, ntnd Skin: no rash or induration seen on limited exam Musculoskeletal: grossly normal tone BUE/BLE Psychiatric: grossly normal mood and affect Neurologic: grossly non-focal.          Labs on Admission:  Basic Metabolic Panel:  Recent Labs Lab 09/18/15 1814  NA 136  K 3.7  CL 99*  CO2 24  GLUCOSE 153*  BUN 16  CREATININE 1.06  CALCIUM 9.8   Liver Function Tests: No results for input(s): AST, ALT, ALKPHOS, BILITOT, PROT, ALBUMIN in the last 168 hours. No results for input(s): LIPASE, AMYLASE in the last 168 hours. No results for input(s): AMMONIA in the last 168 hours. CBC:  Recent Labs Lab 09/18/15 1814  WBC 11.6*  HGB 16.5  HCT 46.5  MCV 99.8  PLT 152   Cardiac Enzymes:  Recent Labs Lab 09/18/15 1935  TROPONINI <0.03    BNP (last 3 results) No results for input(s): BNP in the last 8760  hours.  ProBNP (last 3 results) No results for input(s): PROBNP in the last 8760 hours.  CBG: No results for input(s): GLUCAP in the last 168 hours.  Radiological Exams on Admission: Dg Chest 2 View  09/18/2015  CLINICAL DATA:  Hypertension tachycardia EXAM: CHEST  2 VIEW COMPARISON:  None. FINDINGS: Mild cardiac enlargement and vascular congestion. No pulmonary edema consolidation or effusion. IMPRESSION:  Cardiac enlargement with vascular congestion but no pulmonary edema. Electronically Signed   By: Skipper Cliche M.D.   On: 09/18/2015 20:14   Ct Head Wo Contrast  09/18/2015  CLINICAL DATA:  Headache EXAM: CT HEAD WITHOUT CONTRAST TECHNIQUE: Contiguous axial images were obtained from the base of the skull through the vertex without intravenous contrast. COMPARISON:  10/31/2012 FINDINGS: Moderate global atrophy. Chronic ischemic changes in the periventricular white matter. There is no mass effect, midline shift, or acute hemorrhage. Vessels are diffusely ectatic. Mastoid air cells and visualized paranasal sinuses are clear. IMPRESSION: No acute intracranial pathology. Electronically Signed   By: Marybelle Killings M.D.   On: 09/18/2015 20:38    EKG: atrial fibrillation with RVR  Assessment/Plan Principal Problem:   New onset atrial fibrillation [facility]) Active Problems:   Dyslipidemia   Essential hypertension   Cognitive deficits   1. New onset Atrial Fibrillation CHADSVASC 5 -admit to stepdown unit -will start on diltiazem drip per protocol -monitor rate overnight -cardiology consult spoke with Dr Burt Knack in ED who is currently involved in a STEMI -will check TSH T4 -will also check serial enzymes -will start on xaralto now  2. HTN -will monitor pressures while on diltiazem drip -hold other antihypertensives overnight -will hold HCTZ and losartan  3. Hyperlipidemia -continue with statins -check lipid panel  4. Cognitive deficits -will monitor   Code Status: full code DVT  Prophylaxis:heparin Family Communication: none Disposition Plan:    [facility] Pager [phone]

## 2015-09-18 NOTE — Telephone Encounter (Signed)
Pt's wife called in pt is experiencing a headache and indigestion.  Stated was given tylenol at 3:45 pm.  Pts bp readings are as follows;  204/177 HR 82, 205/115 HR 83, 208/125 88, Truitt Merle, NP triaged call with myself and stated for pt to go to ER.  If wife can't drive call EMS. Pt stated appreciation.

## 2015-09-18 NOTE — ED Notes (Signed)
No answer x1

## 2015-09-19 ENCOUNTER — Inpatient Hospital Stay (HOSPITAL_COMMUNITY): Payer: Medicare HMO

## 2015-09-19 DIAGNOSIS — E785 Hyperlipidemia, unspecified: Secondary | ICD-10-CM

## 2015-09-19 DIAGNOSIS — I4891 Unspecified atrial fibrillation: Secondary | ICD-10-CM | POA: Insufficient documentation

## 2015-09-19 DIAGNOSIS — I480 Paroxysmal atrial fibrillation: Principal | ICD-10-CM

## 2015-09-19 DIAGNOSIS — I161 Hypertensive emergency: Secondary | ICD-10-CM

## 2015-09-19 DIAGNOSIS — I10 Essential (primary) hypertension: Secondary | ICD-10-CM

## 2015-09-19 LAB — LIPID PANEL
CHOL/HDL RATIO: 4 ratio
CHOLESTEROL: 185 mg/dL (ref 0–200)
HDL: 46 mg/dL (ref >40)
LDL Cholesterol: 121 mg/dL — ABNORMAL HIGH (ref 0–99)
Triglycerides: 88 mg/dL (ref <150)
VLDL: 18 mg/dL (ref 0–40)

## 2015-09-19 LAB — BASIC METABOLIC PANEL
ANION GAP: 9 (ref 5–15)
BUN: 15 mg/dL (ref 6–20)
CALCIUM: 9.1 mg/dL (ref 8.9–10.3)
CO2: 25 mmol/L (ref 22–32)
CREATININE: 1.09 mg/dL (ref 0.61–1.24)
Chloride: 102 mmol/L (ref 101–111)
GFR calc non Af Amer: >60 mL/min (ref >60)
Glucose, Bld: 102 mg/dL — ABNORMAL HIGH (ref 65–99)
Potassium: 3.6 mmol/L (ref 3.5–5.1)
SODIUM: 136 mmol/L (ref 135–145)

## 2015-09-19 LAB — PROTIME-INR
INR: 2.56 — ABNORMAL HIGH (ref 0.00–1.49)
PROTHROMBIN TIME: 27.2 s — AB (ref 11.6–15.2)

## 2015-09-19 LAB — TROPONIN I
TROPONIN I: 0.04 ng/mL — AB (ref <0.031)
TROPONIN I: 0.04 ng/mL — AB (ref <0.031)
Troponin I: 0.04 ng/mL — ABNORMAL HIGH (ref <0.031)

## 2015-09-19 LAB — CBC
HCT: 39.9 % (ref 39.0–52.0)
HEMOGLOBIN: 14 g/dL (ref 13.0–17.0)
MCH: 34.9 pg — AB (ref 26.0–34.0)
MCHC: 35.1 g/dL (ref 30.0–36.0)
MCV: 99.5 fL (ref 78.0–100.0)
PLATELETS: 134 10*3/uL — AB (ref 150–400)
RBC: 4.01 MIL/uL — AB (ref 4.22–5.81)
RDW: 12.8 % (ref 11.5–15.5)
WBC: 10.3 10*3/uL (ref 4.0–10.5)

## 2015-09-19 LAB — T4, FREE: FREE T4: 1.09 ng/dL (ref 0.61–1.12)

## 2015-09-19 LAB — GLUCOSE, CAPILLARY: GLUCOSE-CAPILLARY: 122 mg/dL — AB (ref 65–99)

## 2015-09-19 LAB — TSH: TSH: 0.722 u[IU]/mL (ref 0.350–4.500)

## 2015-09-19 MED ORDER — RIVAROXABAN 20 MG PO TABS
20.0000 mg | ORAL_TABLET | Freq: Every day | ORAL | Status: DC
  Administered 2015-09-19: 20 mg via ORAL
  Filled 2015-09-19: qty 1

## 2015-09-19 MED ORDER — ERYTHROMYCIN 5 MG/GM OP OINT
1.0000 "application " | TOPICAL_OINTMENT | Freq: Two times a day (BID) | OPHTHALMIC | Status: DC
  Administered 2015-09-19: 1 via OPHTHALMIC
  Filled 2015-09-19: qty 3.5

## 2015-09-19 MED ORDER — ZOLPIDEM TARTRATE 5 MG PO TABS: 5.0000 mg | ORAL_TABLET | Freq: Every evening | ORAL | Status: DC | PRN

## 2015-09-19 MED ORDER — METOPROLOL TARTRATE 25 MG PO TABS
25.0000 mg | ORAL_TABLET | Freq: Two times a day (BID) | ORAL | Status: DC
  Administered 2015-09-19: 25 mg via ORAL
  Filled 2015-09-19: qty 1

## 2015-09-19 MED ORDER — OFF THE BEAT BOOK
Freq: Once | Status: AC
  Administered 2015-09-19: 10:00:00
  Filled 2015-09-19: qty 1

## 2015-09-19 MED ORDER — DILTIAZEM HCL 100 MG IV SOLR: 5.0000 mg/h | INTRAVENOUS | Status: DC

## 2015-09-19 MED ORDER — ONDANSETRON HCL 4 MG/2ML IJ SOLN: 4.0000 mg | Freq: Four times a day (QID) | INTRAMUSCULAR | Status: DC | PRN

## 2015-09-19 MED ORDER — LOSARTAN POTASSIUM 50 MG PO TABS
50.0000 mg | ORAL_TABLET | Freq: Every day | ORAL | Status: DC
  Administered 2015-09-19: 50 mg via ORAL
  Filled 2015-09-19: qty 1

## 2015-09-19 MED ORDER — ASPIRIN EC 325 MG PO TBEC: 325.0000 mg | DELAYED_RELEASE_TABLET | Freq: Every day | ORAL | Status: DC

## 2015-09-19 MED ORDER — SIMVASTATIN 20 MG PO TABS
20.0000 mg | ORAL_TABLET | Freq: Every day | ORAL | Status: DC
  Filled 2015-09-19: qty 1

## 2015-09-19 MED ORDER — SODIUM CHLORIDE 0.9 % IV SOLN
INTRAVENOUS | Status: DC
  Administered 2015-09-19: 01:00:00 via INTRAVENOUS

## 2015-09-19 MED ORDER — METOPROLOL TARTRATE 25 MG PO TABS: 25.0000 mg | ORAL_TABLET | Freq: Two times a day (BID) | ORAL | Status: DC

## 2015-09-19 MED ORDER — PANTOPRAZOLE SODIUM 40 MG PO TBEC
40.0000 mg | DELAYED_RELEASE_TABLET | Freq: Every day | ORAL | Status: DC
  Administered 2015-09-19: 40 mg via ORAL
  Filled 2015-09-19: qty 1

## 2015-09-19 MED ORDER — ACETAMINOPHEN 325 MG PO TABS: 650.0000 mg | ORAL_TABLET | ORAL | Status: DC | PRN

## 2015-09-19 MED ORDER — RIVAROXABAN 20 MG PO TABS: 20.0000 mg | ORAL_TABLET | Freq: Every day | ORAL | Status: DC

## 2015-09-19 MED ORDER — ADULT MULTIVITAMIN W/MINERALS CH
1.0000 | ORAL_TABLET | Freq: Every day | ORAL | Status: DC
  Administered 2015-09-19: 1 via ORAL
  Filled 2015-09-19: qty 1

## 2015-09-19 MED ORDER — VITAMIN D 1000 UNITS PO TABS
1000.0000 [IU] | ORAL_TABLET | Freq: Every day | ORAL | Status: DC
  Administered 2015-09-19: 1000 [IU] via ORAL
  Filled 2015-09-19: qty 1

## 2015-09-19 MED ORDER — POLYVINYL ALCOHOL 1.4 % OP SOLN
1.0000 [drp] | Freq: Three times a day (TID) | OPHTHALMIC | Status: DC
  Administered 2015-09-19 (×2): 1 [drp] via OPHTHALMIC
  Filled 2015-09-19: qty 15

## 2015-09-19 MED ORDER — ESCITALOPRAM OXALATE 10 MG PO TABS
5.0000 mg | ORAL_TABLET | Freq: Every day | ORAL | Status: DC
  Administered 2015-09-19: 5 mg via ORAL
  Filled 2015-09-19: qty 1

## 2015-09-19 NOTE — Consult Note (Addendum)
CARDIOLOGY CONSULT NOTE   Patient ID: Samuel Santiago MRN: [id number], DOB/AGE: 79/25/1930   Admit date: 09/18/2015 Date of Consult: 09/19/2015   Primary Physician: Walker Kehr, MD Primary Cardiologist: Aundra Dubin  Pt. Profile  65M with HTN, HLD, CAD s/p remote MI (1997) treated with thrombolytics followed by 2 stents to the RCA, remote colonc cancer treated with colectomy and chemo, who presents with hypertension and new onset AF with RVR.   Problem List  Past Medical History  Diagnosis Date  . Hyperlipidemia   . Colon cancer ([place]) 1990    Had recurrence in 2003 resultinbg in chemotherapy. He recently had a colonoscopy and  had a polyp removed that was benign.  . Hypertension     probably related to lack of morning medications and stress of being at most at night in hospital with his wife  . Ischemic heart disease     has had remote MI in 1997 and was treated with TPA. Prior PCI to RCA with repeat PCI to the RCA in 2003  . Kidney stone     x11  . Normal nuclear stress test Feb 2012    No significant ischemia. EF 56%; with basal inferior scar  . History of nephrolithiasis 01/23/2012  . Arthritis   . History of colon polyps   . GERD (gastroesophageal reflux disease)     Past Surgical History  Procedure Laterality Date  . Colonoscopy      colon cancer  . Coronary stent placement  1997 & 2003    PCI to RCA x 2  . Colon surgery      x 2 for cancer  . Eye surgery       Allergies  Allergies  Allergen Reactions  . Tape Other (See Comments)    Pulled off skin one time--- "it really sticks to me" but will probably tolerate paper tape     HPI   65M with HTN, HLD, CAD s/p remote MI (1997) treated with thrombolytics followed by 2 stents to the RCA, remote colonc cancer treated with colectomy and chemo, who presents with hypertension and new onset AF with RVR.   Mr. Arduini was in his USOH today until around 4pm when he developed a HA. His wife took his BP and found it  to be 215-217/100. His pulse was regular. He had associated nausea, vomiting, and some slowed speech. Also c/o indigestion today, which is a chronic problem that is typically well controled with PPI. He presented to the ER for HTN and HAs.   On arrival to the ER, he was hypertensive to 170/106 saturating 99% on RA. ECG on arrival demonstrated AF with RVR. Compared to 04/04/15, AF is new. Peak HRs in 150s. Labs were notable for K 3.7, Cr 1.06, TnI <0.03, WBC 11.6. Head CT demonstrated no acute intracranial pathology. CXR demonstrated enlarged cardiac size with vascular congestion but no edema. He was started on a diltiazem drip and converted to NSR.  He denies having felt palpitations, SOB, CP or chest pressure.  Remote heavy smoker. 1 beer day. 1-2 cups of coffee per day. No energy drinks. He snores frequently.  Since he had treatment for his colon cancer, he has not had any issues with bleeding. His wife has AF and notes that she has been unable to afford NOACs and as a result is taking warfarin.   Inpatient Medications    Family History Family History  Problem Relation Age of Onset  . Arthritis Sister   .  Breast cancer Sister   . Colon polyps Son   . Colon polyps Daughter      Social History Social History   Social History  . Marital Status: Married    Spouse Name: N/A  . Number of Children: 3  . Years of Education: N/A   Occupational History  . retired    Social History Main Topics  . Smoking status: Former Smoker    Quit date: 10/20/1985  . Smokeless tobacco: Never Used  . Alcohol Use: No  . Drug Use: No  . Sexual Activity: Not on file   Other Topics Concern  . Not on file   Social History Narrative     Review of Systems  General:  No chills, fever, night sweats or weight changes.  Cardiovascular:  No chest pain, dyspnea on exertion, edema, orthopnea, palpitations, paroxysmal nocturnal dyspnea. Dermatological: No rash, lesions/masses Respiratory: No cough,  dyspnea Urologic: No hematuria, dysuria Abdominal:   + nausea, vomiting. No diarrhea, bright red blood per rectum, melena, or hematemesis Neurologic:  + HA. No weakness All other systems reviewed and are otherwise negative except as noted above.  Physical Exam  Blood pressure 132/80, pulse 85, temperature 98.1 F (36.7 C), temperature source Oral, resp. rate 21, weight 81.676 kg (180 lb 1 oz), SpO2 93 %.  General: Pleasant, NAD Psych: Normal affect. Neuro: Alert and oriented X 3. Moves all extremities spontaneously. HEENT: Normal  Neck: Supple without bruits or JVD. Lungs:  Resp regular and unlabored. Basilar crackles.  Heart: RRR no s3, s4, or murmurs. Abdomen: Soft, non-tender, non-distended, BS + x 4.  Extremities: No clubbing, cyanosis or edema. DP/PT/Radials 2+ and equal bilaterally.  Labs   Recent Labs  09/18/15 1935  TROPONINI <0.03   Lab Results  Component Value Date   WBC 11.6* 09/18/2015   HGB 16.5 09/18/2015   HCT 46.5 09/18/2015   MCV 99.8 09/18/2015   PLT 152 09/18/2015    Recent Labs Lab 09/18/15 1814  NA 136  K 3.7  CL 99*  CO2 24  BUN 16  CREATININE 1.06  CALCIUM 9.8  GLUCOSE 153*   Lab Results  Component Value Date   CHOL 174 04/04/2015   HDL 53.50 04/04/2015   LDLCALC 100* 04/04/2015   TRIG 102.0 04/04/2015   No results found for: DDIMER  Radiology/Studies  Dg Chest 2 View  09/18/2015  CLINICAL DATA:  Hypertension tachycardia EXAM: CHEST  2 VIEW COMPARISON:  None. FINDINGS: Mild cardiac enlargement and vascular congestion. No pulmonary edema consolidation or effusion. IMPRESSION: Cardiac enlargement with vascular congestion but no pulmonary edema. Electronically Signed   By: Skipper Cliche M.D.   On: 09/18/2015 20:14   Ct Head Wo Contrast  09/18/2015  CLINICAL DATA:  Headache EXAM: CT HEAD WITHOUT CONTRAST TECHNIQUE: Contiguous axial images were obtained from the base of the skull through the vertex without intravenous contrast.  COMPARISON:  10/31/2012 FINDINGS: Moderate global atrophy. Chronic ischemic changes in the periventricular white matter. There is no mass effect, midline shift, or acute hemorrhage. Vessels are diffusely ectatic. Mastoid air cells and visualized paranasal sinuses are clear. IMPRESSION: No acute intracranial pathology. Electronically Signed   By: Marybelle Killings M.D.   On: 09/18/2015 20:38    ECG  ECG on arrival demonstrated AF with RVR. Missing V6. Compared to 04/04/15, AF is new.  ASSESSMENT AND PLAN  57M with HTN, HLD, CAD s/p remote MI (1997) treated with thrombolytics followed by 2 stents to the RCA, remote colon cancer  treated with colectomy and chemo, who presents with hypertension and new onset AF with RVR. BP now under control without much intervention.   PAF, new onset. CHADSVASC = 4 (HTN = 1, Age = 2, prior MI = 1) with relatively low risk for bleeding.  He is a good candidate for systemic anticoagulation. Rivaroxaban has been prescribed although I am not sure he will be able to afford it. Major risk factors for AF are age, HTN, probable OSA. - OK to dose riva (20mg ) tonight  - Case management consultation tomorrow to determine whether a NOAC is feasible - Stop ASA to reduce risk of bleeding as last MI was remote - Start metoprolol 25mg  BID for rate control - No rhythm control agent warranted at this time - Encourage sleep study as an outpatient (he was resistant when I suggested) - K>4, Mg>2 - TSH - TTE  Hypertensive emergency - Continue cozaar from @ 50mg ; may need to increase to 100mg  daily - Continue HCTZ 25mg  daily - Start metoprolol 25mg  BID as per above  CAD s/p remote MI/PCI - Start metoprolol 25mg  BID as per above - continue simvastatin 40mg  daily - Stop ASA to reduce risk for bleeding  Signed, Lamar Sprinkles, MD 09/19/2015, 12:11 AM

## 2015-09-19 NOTE — Progress Notes (Signed)
PATIENT DETAILS Name: Samuel Santiago [id number] Age: 81 y.o. Sex: male Date of Birth: 03-03-1929 Admit Date: 09/18/2015 Admitting Physician Allyne Gee, MD [id number] Plotnikov, MD  Subjective:   Assessment/Plan: Principal Problem: New onset atrial fibrillation: Admitted and started on Cardizem infusion, so spontaneously converted to sinus rhythm. Start metoprolol 25 mg twice a day, continue Xarelto (CHADSVASC = 4). Await echocardiogram, await further recommendations from cardiology.  Active Problems: Uncontrolled hypertension: Better control-continue metoprolol, resume losartan. Follow-up chest accordingly  Dyslipidemia: Continue Zocor  Disposition: Remain inpatient  Antimicrobial agents  See below  Anti-infectives    None      DVT Prophylaxis: Xarelto  Code Status: Full code   Family Communication None  Procedures: NONE  CONSULTS:  cardiology  Time spent 30 minutes-Greater than 50% of this time was spent in counseling, explanation of diagnosis, planning of further management, and coordination of care.  MEDICATIONS: Scheduled Meds: . cholecalciferol  1,000 Units Oral Daily  . erythromycin  1 application Both Eyes BID  . escitalopram  5 mg Oral Daily  . metoprolol tartrate  25 mg Oral BID  . multivitamin with minerals  1 tablet Oral Daily  . pantoprazole  40 mg Oral Daily  . polyvinyl alcohol  1 drop Both Eyes TID AC & HS  . rivaroxaban  20 mg Oral Q supper  . simvastatin  20 mg Oral QHS   Continuous Infusions: . diltiazem (CARDIZEM) infusion Stopped (09/18/15 2133)  . diltiazem (CARDIZEM) infusion     PRN Meds:.acetaminophen, ondansetron (ZOFRAN) IV, zolpidem    PHYSICAL EXAM: Vital signs in last 24 hours: Filed Vitals:   09/19/15 0033 09/19/15 0037 09/19/15 0441 09/19/15 0850  BP: 150/76  131/74 146/68  Pulse: 73  71 72  Temp:  98.5 F (36.9 C) 98.4 F (36.9 C) 98.6 F (37 C)  TempSrc:  Oral Oral Oral  Resp: 20  22    Height:  5\' 6"  (1.676 m)    Weight:  79.153 kg (174 lb 8 oz) 79.153 kg (174 lb 8 oz)   SpO2: 97%  97% 93%    Weight change:  Filed Weights   09/18/15 1748 09/19/15 0037 09/19/15 0441  Weight: 81.676 kg (180 lb 1 oz) 79.153 kg (174 lb 8 oz) 79.153 kg (174 lb 8 oz)   Body mass index is 28.18 kg/(m^2).   Gen Exam: Awake and alert with clear speech.  Neck: Supple, No JVD.   Chest: B/L Clear.   CVS: S1 S2 Regular, no murmurs.  Abdomen: soft, BS +, non tender, non distended.  Extremities: no edema, lower extremities warm to touch. Neurologic: Non Focal.  Skin: No Rash.   Wounds: N/A.    Intake/Output from previous day:  Intake/Output Summary (Last 24 hours) at 09/19/15 1035 Last data filed at 09/19/15 0534  Gross per 24 hour  Intake    240 ml  Output    350 ml  Net   -110 ml     LAB RESULTS: CBC  Recent Labs Lab 09/18/15 1814 09/19/15 0642  WBC 11.6* 10.3  HGB 16.5 14.0  HCT 46.5 39.9  PLT 152 134*  MCV 99.8 99.5  MCH 35.4* 34.9*  MCHC 35.5 35.1  RDW 12.8 12.8    Chemistries   Recent Labs Lab 09/18/15 1814 09/19/15 0642  NA 136 136  K 3.7 3.6  CL 99* 102  CO2 24 25  GLUCOSE 153* 102*  BUN 16 15  CREATININE 1.06 1.09  CALCIUM 9.8 9.1    CBG:  Recent Labs Lab 09/19/15 0026  GLUCAP 122*    GFR Estimated Creatinine Clearance: 49.9 mL/min (by C-G formula based on Cr of 1.09).  Coagulation profile  Recent Labs Lab 09/19/15 0642  INR 2.56*    Cardiac Enzymes  Recent Labs Lab 09/18/15 1935 09/19/15 0131 09/19/15 0642  TROPONINI <0.03 0.04* 0.04*    Invalid input(s): POCBNP No results for input(s): DDIMER in the last 72 hours. No results for input(s): HGBA1C in the last 72 hours.  Recent Labs  09/19/15 0131  CHOL 185  HDL 46  LDLCALC 121*  TRIG 88  CHOLHDL 4.0    Recent Labs  09/19/15 0131  TSH 0.722   No results for input(s): VITAMINB12, FOLATE, FERRITIN, TIBC, IRON, RETICCTPCT in the last 72 hours. No results for  input(s): LIPASE, AMYLASE in the last 72 hours.  Urine Studies No results for input(s): UHGB, CRYS in the last 72 hours.  Invalid input(s): UACOL, UAPR, USPG, UPH, UTP, UGL, UKET, UBIL, UNIT, UROB, ULEU, UEPI, UWBC, URBC, UBAC, CAST, UCOM, BILUA  MICROBIOLOGY: No results found for this or any previous visit (from the past 240 hour(s)).  RADIOLOGY STUDIES/RESULTS: Dg Chest 2 View  09/18/2015  CLINICAL DATA:  Hypertension tachycardia EXAM: CHEST  2 VIEW COMPARISON:  None. FINDINGS: Mild cardiac enlargement and vascular congestion. No pulmonary edema consolidation or effusion. IMPRESSION: Cardiac enlargement with vascular congestion but no pulmonary edema. Electronically Signed   By: Skipper Cliche M.D.   On: 09/18/2015 20:14   Ct Head Wo Contrast  09/18/2015  CLINICAL DATA:  Headache EXAM: CT HEAD WITHOUT CONTRAST TECHNIQUE: Contiguous axial images were obtained from the base of the skull through the vertex without intravenous contrast. COMPARISON:  10/31/2012 FINDINGS: Moderate global atrophy. Chronic ischemic changes in the periventricular white matter. There is no mass effect, midline shift, or acute hemorrhage. Vessels are diffusely ectatic. Mastoid air cells and visualized paranasal sinuses are clear. IMPRESSION: No acute intracranial pathology. Electronically Signed   By: Marybelle Killings M.D.   On: 09/18/2015 20:38    Oren Binet, MD  [facility] Pager:[phone]  If 7PM-7AM, please contact night-coverage [email or web address] Password [email or web address] 09/19/2015, 10:35 AM   LOS: 1 day

## 2015-09-19 NOTE — Progress Notes (Signed)
UR Completed Cavan Bearden Graves-Bigelow, RN,BSN [phone]  

## 2015-09-19 NOTE — Progress Notes (Signed)
  Echocardiogram 2D Echocardiogram has been performed.  Samuel Santiago M 09/19/2015, 9:24 AM

## 2015-09-19 NOTE — Discharge Summary (Addendum)
PATIENT DETAILS Name: Samuel Santiago [name] Age: 81 y.o. Sex: male Date of Birth: 1929/07/03 MRN: [id number]. Admitting Physician: Allyne Gee, MD [id number] Alain Marion, MD  Admit Date: 09/18/2015 Discharge date: 09/19/2015  Recommendations for Outpatient Follow-up:  New Medication:Xarelto, Metoprolol. Discontinued HCTZ  PRIMARY DISCHARGE DIAGNOSIS:  Principal Problem:   New onset atrial fibrillation (HCC) Active Problems:   Dyslipidemia   Essential hypertension   Cognitive deficits   Atrial fibrillation (HCC)   Atrial fibrillation with rapid ventricular response (HCC)      PAST MEDICAL HISTORY: Past Medical History  Diagnosis Date  . Hyperlipidemia   . Colon cancer ([place]) 1990    Had recurrence in 2003 resultinbg in chemotherapy. He recently had a colonoscopy and  had a polyp removed that was benign.  . Hypertension     probably related to lack of morning medications and stress of being at most at night in hospital with his wife  . Ischemic heart disease     has had remote MI in 1997 and was treated with TPA. Prior PCI to RCA with repeat PCI to the RCA in 2003  . Kidney stone     x11  . Normal nuclear stress test Feb 2012    No significant ischemia. EF 56%; with basal inferior scar  . History of nephrolithiasis 01/23/2012  . Arthritis   . History of colon polyps   . GERD (gastroesophageal reflux disease)     DISCHARGE MEDICATIONS: Current Discharge Medication List    START taking these medications   Details  metoprolol tartrate (LOPRESSOR) 25 MG tablet Take 1 tablet (25 mg total) by mouth 2 (two) times daily. Qty: 120 tablet, Refills: 0    rivaroxaban (XARELTO) 20 MG TABS tablet Take 1 tablet (20 mg total) by mouth daily with supper. Qty: 60 tablet, Refills: 0      CONTINUE these medications which have NOT CHANGED   Details  Cholecalciferol (VITAMIN D-3 PO) Take 1 tablet by mouth daily.     CHONDROITIN SULFATE PO Take 1 tablet by mouth daily.    escitalopram  (LEXAPRO) 10 MG tablet Take 0.5 tablets (5 mg total) by mouth daily. May increase to 10 mg in 2 weeks Qty: 30 tablet, Refills: 11    losartan (COZAAR) 50 MG tablet Take 1 tablet (50 mg total) by mouth daily. Qty: 90 tablet, Refills: 2    Multiple Vitamin (MULTIVITAMIN WITH MINERALS) TABS tablet Take 1 tablet by mouth daily.    Omega-3 Fatty Acids (FISH OIL PO) Take 1 capsule by mouth daily.    omeprazole (PRILOSEC) 20 MG capsule TAKE 1 CAPSULE BY MOUTH DAILY Qty: 90 capsule, Refills: 3    polyvinyl alcohol-povidone (REFRESH) 1.4-0.6 % ophthalmic solution Place 1 drop into both eyes 4 (four) times daily. as needed (for dry eyes).    simvastatin (ZOCOR) 40 MG tablet Take 0.5 tablets (20 mg total) by mouth at bedtime. Qty: 90 tablet, Refills: 3      STOP taking these medications     aspirin 325 MG tablet      hydrochlorothiazide (HYDRODIURIL) 25 MG tablet      erythromycin ophthalmic ointment         ALLERGIES:   Allergies  Allergen Reactions  . Tape Other (See Comments)    Pulled off skin one time--- "it really sticks to me" but will probably tolerate paper tape     BRIEF HPI:  See H&P, Labs, Consult and Test reports for all details in brief, patient was  admitted for duration of controlled hypertension and atrial fibrillation with rapid ventricular response.  CONSULTATIONS:   cardiology  PERTINENT RADIOLOGIC STUDIES: Dg Chest 2 View  09/18/2015  CLINICAL DATA:  Hypertension tachycardia EXAM: CHEST  2 VIEW COMPARISON:  None. FINDINGS: Mild cardiac enlargement and vascular congestion. No pulmonary edema consolidation or effusion. IMPRESSION: Cardiac enlargement with vascular congestion but no pulmonary edema. Electronically Signed   By: Skipper Cliche M.D.   On: 09/18/2015 20:14   Ct Head Wo Contrast  09/18/2015  CLINICAL DATA:  Headache EXAM: CT HEAD WITHOUT CONTRAST TECHNIQUE: Contiguous axial images were obtained from the base of the skull through the vertex without  intravenous contrast. COMPARISON:  10/31/2012 FINDINGS: Moderate global atrophy. Chronic ischemic changes in the periventricular white matter. There is no mass effect, midline shift, or acute hemorrhage. Vessels are diffusely ectatic. Mastoid air cells and visualized paranasal sinuses are clear. IMPRESSION: No acute intracranial pathology. Electronically Signed   By: Marybelle Killings M.D.   On: 09/18/2015 20:38     PERTINENT LAB RESULTS: CBC:  Recent Labs  09/18/15 1814 09/19/15 0642  WBC 11.6* 10.3  HGB 16.5 14.0  HCT 46.5 39.9  PLT 152 134*   CMET CMP     Component Value Date/Time   NA 136 09/19/2015 0642   K 3.6 09/19/2015 0642   CL 102 09/19/2015 0642   CO2 25 09/19/2015 0642   GLUCOSE 102* 09/19/2015 0642   BUN 15 09/19/2015 0642   CREATININE 1.09 09/19/2015 0642   CALCIUM 9.1 09/19/2015 0642   PROT 6.9 04/04/2015 1204   ALBUMIN 4.1 04/04/2015 1204   AST 25 04/04/2015 1204   ALT 23 04/04/2015 1204   ALKPHOS 61 04/04/2015 1204   BILITOT 1.1 04/04/2015 1204   GFRNONAA >60 09/19/2015 0642   GFRAA >60 09/19/2015 0642    GFR Estimated Creatinine Clearance: 49.9 mL/min (by C-G formula based on Cr of 1.09). No results for input(s): LIPASE, AMYLASE in the last 72 hours.  Recent Labs  09/19/15 0131 09/19/15 0642 09/19/15 1158  TROPONINI 0.04* 0.04* 0.04*   Invalid input(s): POCBNP No results for input(s): DDIMER in the last 72 hours. No results for input(s): HGBA1C in the last 72 hours.  Recent Labs  09/19/15 0131  CHOL 185  HDL 46  LDLCALC 121*  TRIG 88  CHOLHDL 4.0    Recent Labs  09/19/15 0131  TSH 0.722   No results for input(s): VITAMINB12, FOLATE, FERRITIN, TIBC, IRON, RETICCTPCT in the last 72 hours. Coags:  Recent Labs  09/19/15 0642  INR 2.56*   Microbiology: No results found for this or any previous visit (from the past 240 hour(s)).   BRIEF HOSPITAL COURSE:  New onset atrial fibrillation: Admitted and started on Cardizem infusion,  spontaneously converted to sinus rhythm. Started metoprolol 25 mg twice a day, continue Xarelto (CHADSVASC = 4). Note initially patient and spouse were very hesitant to go home with Xarelto, however since he gets a free one month supply-he is willing to try this out for a month. Patient and spouse both are aware that they will need to see their primary care doctor or primary cardiologist within 1 month to decide on long-term anticoagulation.Echocardiogram showed preserved ejection fraction. No further recommendations from cardiology, okay to discharge.   Active Problems: Uncontrolled hypertension: Better controlled-continue metoprolol, and losartan.   Dyslipidemia: Continue Zocor  TODAY-DAY OF DISCHARGE:  Subjective:   Samuel Santiago today has no headache,no chest abdominal pain,no new weakness tingling or numbness, feels much  better wants to go home today.  Objective:   Blood pressure 135/80, pulse 61, temperature 98.7 F (37.1 C), temperature source Oral, resp. rate 22, height 5\' 6"  (1.676 m), weight 79.153 kg (174 lb 8 oz), SpO2 97 %.  Intake/Output Summary (Last 24 hours) at 09/19/15 1506 Last data filed at 09/19/15 1246  Gross per 24 hour  Intake    720 ml  Output    350 ml  Net    370 ml   Filed Weights   09/18/15 1748 09/19/15 0037 09/19/15 0441  Weight: 81.676 kg (180 lb 1 oz) 79.153 kg (174 lb 8 oz) 79.153 kg (174 lb 8 oz)    Exam Awake Alert, Oriented *3, No new F.N deficits, Normal affect [place].AT,PERRAL Supple Neck,No JVD, No cervical lymphadenopathy appriciated.  Symmetrical Chest wall movement, Good air movement bilaterally, CTAB RRR,No Gallops,Rubs or new Murmurs, No Parasternal Heave +ve B.Sounds, Abd Soft, Non tender, No organomegaly appriciated, No rebound -guarding or rigidity. No Cyanosis, Clubbing or edema, No new Rash or bruise  DISCHARGE CONDITION: Stable  DISPOSITION: Home with home health services  DISCHARGE INSTRUCTIONS:    Activity:  As tolerated    Get Medicines reviewed and adjusted: Please take all your medications with you for your next visit with your Primary MD  Please request your Primary MD to go over all hospital tests and procedure/radiological results at the follow up, please ask your Primary MD to get all Hospital records sent to his/her office.  If you experience worsening of your admission symptoms, develop shortness of breath, life threatening emergency, suicidal or homicidal thoughts you must seek medical attention immediately by calling 911 or calling your MD immediately  if symptoms less severe.  You must read complete instructions/literature along with all the possible adverse reactions/side effects for all the Medicines you take and that have been prescribed to you. Take any new Medicines after you have completely understood and accpet all the possible adverse reactions/side effects.   Do not drive when taking Pain medications.   Do not take more than prescribed Pain, Sleep and Anxiety Medications  Special Instructions: If you have smoked or chewed Tobacco  in the last 2 yrs please stop smoking, stop any regular Alcohol  and or any Recreational drug use.  Wear Seat belts while driving.  Please note  You were cared for by a hospitalist during your hospital stay. Once you are discharged, your primary care physician will handle any further medical issues. Please note that NO REFILLS for any discharge medications will be authorized once you are discharged, as it is imperative that you return to your primary care physician (or establish a relationship with a primary care physician if you do not have one) for your aftercare needs so that they can reassess your need for medications and monitor your lab values.  Diet recommendation: Heart Healthy diet   Discharge Instructions    Call MD for:  difficulty breathing, headache or visual disturbances    Complete by:  As directed      Call MD for:  persistant nausea and  vomiting    Complete by:  As directed      Call MD for:  redness, tenderness, or signs of infection (pain, swelling, redness, odor or green/yellow discharge around incision site)    Complete by:  As directed      Diet - low sodium heart healthy    Complete by:  As directed      Increase activity slowly  Complete by:  As directed            Follow-up Information    Follow up with Walker Kehr, MD. Schedule an appointment as soon as possible for a visit in 1 week.   Specialty:  Internal Medicine   Why:  Hospital follow up   Contact information:   [address] [place] [address] [phone]       Follow up with Loralie Champagne, MD. Schedule an appointment as soon as possible for a visit in 2 weeks.   Specialty:  Cardiology   Why:  Hospital follow up   Contact information:   [id number] [place] [address] [phone]       Total Time spent on discharge equals  45 minutes.  SignedOren Binet 09/19/2015 3:06 PM

## 2015-09-19 NOTE — Progress Notes (Signed)
TELEMETRY: Reviewed telemetry pt in NSR, no further Afib.  Filed Vitals:   09/19/15 0037 09/19/15 0441 09/19/15 0850 09/19/15 1212  BP:  131/74 146/68 135/80  Pulse:  71 72 61  Temp: 98.5 F (36.9 C) 98.4 F (36.9 C) 98.6 F (37 C) 98.7 F (37.1 C)  TempSrc: Oral Oral Oral Oral  Resp:  22    Height: 5\' 6"  (1.676 m)     Weight: 79.153 kg (174 lb 8 oz) 79.153 kg (174 lb 8 oz)    SpO2:  97% 93% 97%    Intake/Output Summary (Last 24 hours) at 09/19/15 1434 Last data filed at 09/19/15 1246  Gross per 24 hour  Intake    720 ml  Output    350 ml  Net    370 ml   Filed Weights   09/18/15 1748 09/19/15 0037 09/19/15 0441  Weight: 81.676 kg (180 lb 1 oz) 79.153 kg (174 lb 8 oz) 79.153 kg (174 lb 8 oz)    Subjective Feels fine. No chest pain or SOB. Came to ED because of elevated BP. Did not have any palpitations with AFib.  . cholecalciferol  1,000 Units Oral Daily  . erythromycin  1 application Both Eyes BID  . escitalopram  5 mg Oral Daily  . losartan  50 mg Oral Daily  . metoprolol tartrate  25 mg Oral BID  . multivitamin with minerals  1 tablet Oral Daily  . pantoprazole  40 mg Oral Daily  . polyvinyl alcohol  1 drop Both Eyes TID AC & HS  . rivaroxaban  20 mg Oral Q supper  . simvastatin  20 mg Oral QHS      LABS: Basic Metabolic Panel:  Recent Labs  09/18/15 1814 09/19/15 0642  NA 136 136  K 3.7 3.6  CL 99* 102  CO2 24 25  GLUCOSE 153* 102*  BUN 16 15  CREATININE 1.06 1.09  CALCIUM 9.8 9.1   Liver Function Tests: No results for input(s): AST, ALT, ALKPHOS, BILITOT, PROT, ALBUMIN in the last 72 hours. No results for input(s): LIPASE, AMYLASE in the last 72 hours. CBC:  Recent Labs  09/18/15 1814 09/19/15 0642  WBC 11.6* 10.3  HGB 16.5 14.0  HCT 46.5 39.9  MCV 99.8 99.5  PLT 152 134*   Cardiac Enzymes:  Recent Labs  09/19/15 0131 09/19/15 0642 09/19/15 1158  TROPONINI 0.04* 0.04* 0.04*   BNP: No results for input(s): PROBNP in the  last 72 hours. D-Dimer: No results for input(s): DDIMER in the last 72 hours. Hemoglobin A1C: No results for input(s): HGBA1C in the last 72 hours. Fasting Lipid Panel:  Recent Labs  09/19/15 0131  CHOL 185  HDL 46  LDLCALC 121*  TRIG 88  CHOLHDL 4.0   Thyroid Function Tests:  Recent Labs  09/19/15 0131  TSH 0.722     Radiology/Studies:  Dg Chest 2 View  09/18/2015  CLINICAL DATA:  Hypertension tachycardia EXAM: CHEST  2 VIEW COMPARISON:  None. FINDINGS: Mild cardiac enlargement and vascular congestion. No pulmonary edema consolidation or effusion. IMPRESSION: Cardiac enlargement with vascular congestion but no pulmonary edema. Electronically Signed   By: Skipper Cliche M.D.   On: 09/18/2015 20:14   Ct Head Wo Contrast  09/18/2015  CLINICAL DATA:  Headache EXAM: CT HEAD WITHOUT CONTRAST TECHNIQUE: Contiguous axial images were obtained from the base of the skull through the vertex without intravenous contrast. COMPARISON:  10/31/2012 FINDINGS: Moderate global atrophy. Chronic ischemic changes in the  periventricular white matter. There is no mass effect, midline shift, or acute hemorrhage. Vessels are diffusely ectatic. Mastoid air cells and visualized paranasal sinuses are clear. IMPRESSION: No acute intracranial pathology. Electronically Signed   By: Marybelle Killings M.D.   On: 09/18/2015 20:38    PHYSICAL EXAM General: Well developed, elderly, in no acute distress. Head: Normocephalic, atraumatic, sclera non-icteric, oropharynx is clear Neck: Negative for carotid bruits. JVD not elevated. No adenopathy Lungs: Clear bilaterally to auscultation without wheezes, rales, or rhonchi. Breathing is unlabored. Heart: RRR S1 S2 without murmurs, rubs, or gallops.  Abdomen: Soft, non-tender, non-distended with normoactive bowel sounds. No hepatomegaly. No rebound/guarding. No obvious abdominal masses. Msk:  Strength and tone appears normal for age. Extremities: No clubbing, cyanosis or  edema.  Distal pedal pulses are 2+ and equal bilaterally. Neuro: Alert and oriented X 3. Moves all extremities spontaneously. Psych:  Responds to questions appropriately with a normal affect.  ASSESSMENT AND PLAN: 1. Atrial fibrillation-paroxysmal. Converted to NSR. CHAD-vasc score of 4. Metoprolol added for rate control. Discussed anticoagulant therapy with patient and wife. Wife reports she took Xarelto in the past but couldn't afford. It. She has been on coumadin and is quite happy to have her blood drawn. We can start coumadin and have him follow up in coumadin clinic. No bridging needed. Echo is normal.  2. HTN. Well controlled now on metoprolol and losartan.  3. Dyslipidemia on Zocor.   Mr. Murphy is stable for DC from our standpoint. Will arrange follow up in our office with coumadin follow up as well.   Present on Admission:  . Essential hypertension . Dyslipidemia . Cognitive deficits . Atrial fibrillation ([place])  Signed, Peter Martinique, [place] 09/19/2015 2:34 PM

## 2015-09-19 NOTE — Evaluation (Signed)
Physical Therapy Evaluation Patient Details Name: Samuel Santiago MRN: [id number] DOB: 1929/10/11 Today's Date: 09/19/2015   History of Present Illness  Patient is a 81 y/o male with history of HTN, HLD and kidney stones presents to the ED with high blood pressures and found to have new onset A-fib. Started on Cardizem infusion and spontaneously converted to sinus rhythm.   Clinical Impression  Patient presents with functional limitations due to deficits listed in PT problem list (see below). Pt high fall risk. Discussed importance of using RW at all times at home to decrease risk of falls. Would benefit from HHPT for a safety evaluation of home and teaching pt how to safely use rollator/tub bench for bathing. Requires Min A at times for safety during ambulation due to poor safety awareness. Will follow acutely to maximize independence and mobility prior to return home.    Follow Up Recommendations Home health PT;Supervision/Assistance - 24 hour    Equipment Recommendations  None recommended by PT    Recommendations for Other Services OT consult     Precautions / Restrictions Precautions Precautions: Fall Restrictions Weight Bearing Restrictions: No      Mobility  Bed Mobility Overal bed mobility: Needs Assistance Bed Mobility: Supine to Sit;Sit to Supine     Supine to sit: Modified independent (Device/Increase time);HOB elevated Sit to supine: Modified independent (Device/Increase time);HOB elevated   General bed mobility comments: Increased time and some difficulty but no assist needed.  Transfers Overall transfer level: Needs assistance Equipment used: None Transfers: Sit to/from Stand Sit to Stand: Supervision         General transfer comment: Supervision for safety.   Ambulation/Gait Ambulation/Gait assistance: Min assist Ambulation Distance (Feet): 125 Feet Assistive device: Rolling walker (2 wheeled) Gait Pattern/deviations: Step-through pattern;Decreased  stride length Gait velocity: decreased   General Gait Details: Pt listing left/right at times and drifting noted. "see, I feel like I am going to fall now," with pt leaning left. Cues for RW proximity/management as walker too far anterior.  Stairs            Wheelchair Mobility    Modified Rankin (Stroke Patients Only)       Balance Overall balance assessment: Needs assistance Sitting-balance support: Feet supported;No upper extremity supported Sitting balance-Leahy Scale: Good     Standing balance support: During functional activity Standing balance-Leahy Scale: Poor Standing balance comment: Relient on RW for support.                              Pertinent Vitals/Pain Pain Assessment: No/denies pain    Home Living Family/patient expects to be discharged to:: Private residence Living Arrangements: Spouse/significant other Available Help at Discharge: Family;Available 24 hours/day Type of Home: House Home Access: Stairs to enter Entrance Stairs-Rails: Right Entrance Stairs-Number of Steps: 3 Home Layout: One level Home Equipment: Walker - 2 wheels;Walker - 4 wheels;Tub bench;Shower seat;Cane - single point      Prior Function Level of Independence: Independent with assistive device(s)         Comments: Pt reports furniture walking at home and bumping into things. Does not like to use walker.     Hand Dominance        Extremity/Trunk Assessment   Upper Extremity Assessment: Defer to OT evaluation           Lower Extremity Assessment: Generalized weakness         Communication   Communication: [facility]  Cognition Arousal/Alertness: Awake/alert Behavior During Therapy: WFL for tasks assessed/performed Overall Cognitive Status: Impaired/Different from baseline Area of Impairment: Safety/judgement         Safety/Judgement: Decreased awareness of safety;Decreased awareness of deficits     General Comments: Not able to state year,  "2030" but able to state month. What do you do when you feel dizzy? - "bounce of walls here and there."    General Comments General comments (skin integrity, edema, etc.): Lengthy discussion re: safety at home - using tub bench instead of standing in shower, using RW at all times, risk of bleeding due to being on blood thinners and importance of safe mobility and avoiding falls.    Exercises        Assessment/Plan    PT Assessment Patient needs continued PT services  PT Diagnosis Difficulty walking   PT Problem List Decreased strength;Decreased balance;Decreased mobility;Decreased safety awareness;Decreased knowledge of use of DME;Decreased cognition  PT Treatment Interventions Balance training;Gait training;Stair training;Patient/family education;Functional mobility training;Therapeutic activities;Therapeutic exercise   PT Goals (Current goals can be found in the Care Plan section) Acute Rehab PT Goals Patient Stated Goal: to go home PT Goal Formulation: With patient Time For Goal Achievement: 10/03/15 Potential to Achieve Goals: Good    Frequency Min 3X/week   Barriers to discharge        Co-evaluation               End of Session Equipment Utilized During Treatment: Gait belt Activity Tolerance: Patient tolerated treatment well Patient left: in bed;with call bell/phone within reach;with bed alarm set;with family/visitor present Nurse Communication: Mobility status         Time: 1430-1454 PT Time Calculation (min) (ACUTE ONLY): 24 min   Charges:   PT Evaluation $Initial PT Evaluation Tier I: 1 Procedure PT Treatments $Gait Training: 8-22 mins   PT G Codes:        Demaryius Imran A Meris Reede 09/19/2015, 3:11 PM Wray Kearns, [place], DPT [phone]

## 2015-09-19 NOTE — Care Management Note (Addendum)
Case Management Note  Patient Details  Name: Samuel Santiago MRN: [id number] Date of Birth: [birth year]  Subjective/Objective:  Pt admitted for New onset A fib.                   Action/Plan: Consult for NOAC received: Benefits check in process and will make pt aware of cost once completed.    Expected Discharge Date:                  Expected Discharge Plan:  Home/Self Care  In-House Referral:  NA  Discharge planning Services  CM Consult, Medication Assistance  Post Acute Care Choice:  NA Choice offered to:  NA  DME Arranged:  N/A DME Agency:  NA  HH Arranged:  Home Health Physical Therapy HH Agency:  [facility] Status of Service:  Completed, signed off  Medicare Important Message Given:    Date Medicare IM Given:    Medicare IM give by:    Date Additional Medicare IM Given:    Additional Medicare Important Message give by:     If discussed at [place] of Stay Meetings, dates discussed:    Additional Comments: Plan will be home on Xarelto. [name] has medication available. CM will provide pt with 30 day free card and a personal care service agency list. Referral made to [facility] for PT services. SOC to begin within 24-48 hrs post d/c. No further needs from CM at this time.   Bethena Roys, RN 09/19/2015, 10:13 AM

## 2015-09-19 NOTE — Discharge Instructions (Signed)
Rivaroxaban oral tablets What is this medicine? RIVAROXABAN (ri va ROX a ban) is an anticoagulant (blood thinner). It is used to treat blood clots in the lungs or in the veins. It is also used after knee or hip surgeries to prevent blood clots. It is also used to lower the chance of stroke in people with a medical condition called atrial fibrillation. This medicine may be used for other purposes; ask your health care provider or pharmacist if you have questions. What should I tell my health care provider before I take this medicine? They need to know if you have any of these conditions: -bleeding disorders -bleeding in the brain -blood in your stools (black or tarry stools) or if you have blood in your vomit -history of stomach bleeding -kidney disease -liver disease -low blood counts, like low white cell, platelet, or red cell counts -recent or planned spinal or epidural procedure -take medicines that treat or prevent blood clots -an unusual or allergic reaction to rivaroxaban, other medicines, foods, dyes, or preservatives -pregnant or trying to get pregnant -breast-feeding How should I use this medicine? Take this medicine by mouth with a glass of water. Follow the directions on the prescription label. Take your medicine at regular intervals. Do not take it more often than directed. Do not stop taking except on your doctor's advice. Stopping this medicine may increase your risk of a blood clot. Be sure to refill your prescription before you run out of medicine. If you are taking this medicine after hip or knee replacement surgery, take it with or without food. If you are taking this medicine for atrial fibrillation, take it with your evening meal. If you are taking this medicine to treat blood clots, take it with food at the same time each day. If you are unable to swallow your tablet, you may crush the tablet and mix it in applesauce. Then, immediately eat the applesauce. You should eat more  food right after you eat the applesauce containing the crushed tablet. Talk to your pediatrician regarding the use of this medicine in children. Special care may be needed. Overdosage: If you think you have taken too much of this medicine contact a poison control center or emergency room at once. NOTE: This medicine is only for you. Do not share this medicine with others. What if I miss a dose? If you take your medicine once a day and miss a dose, take the missed dose as soon as you remember. If you take your medicine twice a day and miss a dose, take the missed dose immediately. In this instance, 2 tablets may be taken at the same time. The next day you should take 1 tablet twice a day as directed. What may interact with this medicine? -aspirin and aspirin-like medicines -certain antibiotics like erythromycin, azithromycin, and clarithromycin -certain medicines for fungal infections like ketoconazole and itraconazole -certain medicines for irregular heart beat like amiodarone, quinidine, dronedarone -certain medicines for seizures like carbamazepine, phenytoin -certain medicines that treat or prevent blood clots like warfarin, enoxaparin, and dalteparin -conivaptan -diltiazem -felodipine -indinavir -lopinavir; ritonavir -NSAIDS, medicines for pain and inflammation, like ibuprofen or naproxen -ranolazine -rifampin -ritonavir -St. John's wort -verapamil This list may not describe all possible interactions. Give your health care provider a list of all the medicines, herbs, non-prescription drugs, or dietary supplements you use. Also tell them if you smoke, drink alcohol, or use illegal drugs. Some items may interact with your medicine. What should I watch for while using this   medicine? Visit your doctor or health care professional for regular checks on your progress. Your condition will be monitored carefully while you are receiving this medicine. Notify your doctor or health care  professional and seek emergency treatment if you develop breathing problems; changes in vision; chest pain; severe, sudden headache; pain, swelling, warmth in the leg; trouble speaking; sudden numbness or weakness of the face, arm, or leg. These can be signs that your condition has gotten worse. If you are going to have surgery, tell your doctor or health care professional that you are taking this medicine. Tell your health care professional that you use this medicine before you have a spinal or epidural procedure. Sometimes people who take this medicine have bleeding problems around the spine when they have a spinal or epidural procedure. This bleeding is very rare. If you have a spinal or epidural procedure while on this medicine, call your health care professional immediately if you have back pain, numbness or tingling (especially in your legs and feet), muscle weakness, paralysis, or loss of bladder or bowel control. Avoid sports and activities that might cause injury while you are using this medicine. Severe falls or injuries can cause unseen bleeding. Be careful when using sharp tools or knives. Consider using an Copy. Take special care brushing or flossing your teeth. Report any injuries, bruising, or red spots on the skin to your doctor or health care professional. What side effects may I notice from receiving this medicine? Side effects that you should report to your doctor or health care professional as soon as possible: -allergic reactions like skin rash, itching or hives, swelling of the face, lips, or tongue -back pain -redness, blistering, peeling or loosening of the skin, including inside the mouth -signs and symptoms of bleeding such as bloody or black, tarry stools; red or dark-brown urine; spitting up blood or brown material that looks like coffee grounds; red spots on the skin; unusual bruising or bleeding from the eye, gums, or nose Side effects that usually do not require  medical attention (Report these to your doctor or health care professional if they continue or are bothersome.): -dizziness -muscle pain This list may not describe all possible side effects. Call your doctor for medical advice about side effects. You may report side effects to FDA at 1-800-FDA-1088. Where should I keep my medicine? Keep out of the reach of children. Store at room temperature between 15 and 30 degrees C (59 and 86 degrees F). Throw away any unused medicine after the expiration date. NOTE: This sheet is a summary. It may not cover all possible information. If you have questions about this medicine, talk to your doctor, pharmacist, or health care provider.    2016, Elsevier/Gold Standard. (2014-10-04 12:45:34) ________________________  Information on my medicine - XARELTO (Rivaroxaban)  This medication education was reviewed with me or my healthcare representative as part of my discharge preparation.   Why was Xarelto prescribed for you? Xarelto was prescribed for you to reduce the risk of a blood clot forming that can cause a stroke if you have a medical condition called atrial fibrillation (a type of irregular heartbeat).  What do you need to know about xarelto ? Take your Xarelto ONCE DAILY at the same time every day with your evening meal. If you have difficulty swallowing the tablet whole, you may crush it and mix in applesauce just prior to taking your dose.  Take Xarelto exactly as prescribed by your doctor and DO NOT stop taking  Xarelto without talking to the doctor who prescribed the medication.  Stopping without other stroke prevention medication to take the place of Xarelto may increase your risk of developing a clot that causes a stroke.  Refill your prescription before you run out.  After discharge, you should have regular check-up appointments with your healthcare provider that is prescribing your Xarelto.  In the future your dose may need to be changed  if your kidney function or weight changes by a significant amount.  What do you do if you miss a dose? If you are taking Xarelto ONCE DAILY and you miss a dose, take it as soon as you remember on the same day then continue your regularly scheduled once daily regimen the next day. Do not take two doses of Xarelto at the same time or on the same day.   Important Safety Information A possible side effect of Xarelto is bleeding. You should call your healthcare provider right away if you experience any of the following: ? Bleeding from an injury or your nose that does not stop. ? Unusual colored urine (red or dark brown) or unusual colored stools (red or black). ? Unusual bruising for unknown reasons. ? A serious fall or if you hit your head (even if there is no bleeding).  Some medicines may interact with Xarelto and might increase your risk of bleeding while on Xarelto. To help avoid this, consult your healthcare provider or pharmacist prior to using any new prescription or non-prescription medications, including herbals, vitamins, non-steroidal anti-inflammatory drugs (NSAIDs) and supplements.  This website has more information on Xarelto: [email or web address]

## 2015-09-20 LAB — HEMOGLOBIN A1C
Hgb A1c MFr Bld: 5.9 % — ABNORMAL HIGH (ref 4.8–5.6)
Mean Plasma Glucose: 123 mg/dL

## 2015-09-21 ENCOUNTER — Telehealth: Payer: Self-pay | Admitting: *Deleted

## 2015-09-21 NOTE — Telephone Encounter (Signed)
Called pt to verify TCM  hosp f/u that already been set-up no answer LMOM RTC....Johny Chess

## 2015-09-21 NOTE — Telephone Encounter (Signed)
Pt wife return call back completed TCM call below...lmb  Transition Care Management Follow-up Telephone Call   Date discharged? 09/19/15   How have you been since you were released from the hospital? Wife states he is doing pretty good. BP been ok she checks it about 2 times a dya. (P) running in the70's   Do you understand why you were in the hospital? YES   Do you understand the discharge instructions? YES   Where were you discharged to? Home   Items Reviewed:  Medications reviewed: YES  Allergies reviewed: YES  Dietary changes reviewed: YES  Referrals reviewed: No referral needed   Functional Questionnaire:   Activities of Daily Living (ADLs):   She states the are independent in the following: ambulation, bathing and hygiene, feeding, continence, grooming, toileting and dressing States he doesn't require assistance    Any transportation issues/concerns?: NO   Any patient concerns? NO   Confirmed importance and date/time of follow-up visits scheduled {YES, appt already made verified if they was aware she stated yes for next Thurs 09/27/15  Provider Appointment booked with Dr. Alain Marion  Confirmed with patient if condition begins to worsen call PCP or go to the ER.  Patient was given the office number and encouraged to call back with question or concerns.  : YES

## 2015-09-27 ENCOUNTER — Ambulatory Visit (INDEPENDENT_AMBULATORY_CARE_PROVIDER_SITE_OTHER): Payer: Medicare HMO | Admitting: Internal Medicine

## 2015-09-27 ENCOUNTER — Encounter: Payer: Self-pay | Admitting: Internal Medicine

## 2015-09-27 VITALS — BP 160/88 | HR 56 | Wt 179.0 lb

## 2015-09-27 DIAGNOSIS — R413 Other amnesia: Secondary | ICD-10-CM

## 2015-09-27 DIAGNOSIS — I4891 Unspecified atrial fibrillation: Secondary | ICD-10-CM

## 2015-09-27 DIAGNOSIS — E785 Hyperlipidemia, unspecified: Secondary | ICD-10-CM

## 2015-09-27 DIAGNOSIS — I480 Paroxysmal atrial fibrillation: Secondary | ICD-10-CM | POA: Diagnosis not present

## 2015-09-27 DIAGNOSIS — I10 Essential (primary) hypertension: Secondary | ICD-10-CM

## 2015-09-27 MED ORDER — DONEPEZIL HCL 5 MG PO TABS
5.0000 mg | ORAL_TABLET | Freq: Every day | ORAL | Status: DC
Start: 2015-09-27 — End: 2016-05-09

## 2015-09-27 MED ORDER — RIVAROXABAN 20 MG PO TABS: 20.0000 mg | ORAL_TABLET | Freq: Every day | ORAL | Status: DC

## 2015-09-27 NOTE — Patient Instructions (Signed)
Hold Zocor (Simvastatin) if leg weakness gets worse

## 2015-09-27 NOTE — Assessment & Plan Note (Signed)
12.16 - add Aricept

## 2015-09-27 NOTE — Progress Notes (Signed)
Pre visit review using our clinic review tool, if applicable. No additional management support is needed unless otherwise documented below in the visit note. 

## 2015-09-27 NOTE — Assessment & Plan Note (Signed)
Chronic   Toprol 

## 2015-09-27 NOTE — Assessment & Plan Note (Signed)
12/16 Xarelto  Potential benefits of a long term Xarelto  use as well as potential risks  and complications were explained to the patient and were aknowledged. Now in S brady w/irreg beats on Toprol

## 2015-09-27 NOTE — Progress Notes (Signed)
Subjective:  Patient ID: Samuel Santiago, male    DOB: 1929/06/07  Age: 81 y.o. MRN: [id number]  CC: No chief complaint on file.   HPI Samuel Santiago [id number] presents for post-hosp f/u for A fib. F/u anxiety, memory issues.  Admit Date: 09/18/2015 Discharge date: 09/19/2015  Recommendations for Outpatient Follow-up:  New Medication:Xarelto, Metoprolol. Discontinued HCTZ  PRIMARY DISCHARGE DIAGNOSIS: Principal Problem:  New onset atrial fibrillation (HCC) Active Problems:  Dyslipidemia  Essential hypertension  Cognitive deficits  Atrial fibrillation (HCC)  Atrial fibrillation with rapid ventricular response (HCC)    PAST MEDICAL HISTORY: Past Medical History  Diagnosis Date  . Hyperlipidemia   . Colon cancer ([place]) 1990    Had recurrence in 2003 resultinbg in chemotherapy. He recently had a colonoscopy and had a polyp removed that was benign.  . Hypertension     probably related to lack of morning medications and stress of being at most at night in hospital with his wife  . Ischemic heart disease     has had remote MI in 1997 and was treated with TPA. Prior PCI to RCA with repeat PCI to the RCA in 2003  . Kidney stone     x11  . Normal nuclear stress test Feb 2012    No significant ischemia. EF 56%; with basal inferior scar  . History of nephrolithiasis 01/23/2012  . Arthritis   . History of colon polyps   . GERD (gastroesophageal reflux disease)     DISCHARGE MEDICATIONS: Current Discharge Medication List    START taking these medications   Details  metoprolol tartrate (LOPRESSOR) 25 MG tablet Take 1 tablet (25 mg total) by mouth 2 (two) times daily. Qty: 120 tablet, Refills: 0    rivaroxaban (XARELTO) 20 MG TABS tablet Take 1 tablet (20 mg total) by mouth daily with supper. Qty: 60 tablet, Refills: 0      CONTINUE these medications which have NOT CHANGED   Details  Cholecalciferol (VITAMIN  D-3 PO) Take 1 tablet by mouth daily.     CHONDROITIN SULFATE PO Take 1 tablet by mouth daily.    escitalopram (LEXAPRO) 10 MG tablet Take 0.5 tablets (5 mg total) by mouth daily. May increase to 10 mg in 2 weeks Qty: 30 tablet, Refills: 11    losartan (COZAAR) 50 MG tablet Take 1 tablet (50 mg total) by mouth daily. Qty: 90 tablet, Refills: 2    Multiple Vitamin (MULTIVITAMIN WITH MINERALS) TABS tablet Take 1 tablet by mouth daily.    Omega-3 Fatty Acids (FISH OIL PO) Take 1 capsule by mouth daily.    omeprazole (PRILOSEC) 20 MG capsule TAKE 1 CAPSULE BY MOUTH DAILY Qty: 90 capsule, Refills: 3    polyvinyl alcohol-povidone (REFRESH) 1.4-0.6 % ophthalmic solution Place 1 drop into both eyes 4 (four) times daily. as needed (for dry eyes).    simvastatin (ZOCOR) 40 MG tablet Take 0.5 tablets (20 mg total) by mouth at bedtime. Qty: 90 tablet, Refills: 3      STOP taking these medications     aspirin 325 MG tablet      hydrochlorothiazide (HYDRODIURIL) 25 MG tablet      erythromycin ophthalmic ointment         ALLERGIES:  Allergies  Allergen Reactions  . Tape Other (See Comments)    Pulled off skin one time--- "it really sticks to me" but will probably tolerate paper tape     BRIEF HPI: See H&P, Labs, Consult and Test reports for all  details in brief, patient was admitted for duration of controlled hypertension and atrial fibrillation with rapid ventricular response.  CONSULTATIONS:  cardiology  PERTINENT RADIOLOGIC STUDIES:  Imaging Results    Dg Chest 2 View  09/18/2015 CLINICAL DATA: Hypertension tachycardia EXAM: CHEST 2 VIEW COMPARISON: None. FINDINGS: Mild cardiac enlargement and vascular congestion. No pulmonary edema consolidation or effusion. IMPRESSION: Cardiac enlargement with vascular congestion but no pulmonary edema. Electronically Signed By: Skipper Cliche M.D. On: 09/18/2015 20:14   Ct  Head Wo Contrast  09/18/2015 CLINICAL DATA: Headache EXAM: CT HEAD WITHOUT CONTRAST TECHNIQUE: Contiguous axial images were obtained from the base of the skull through the vertex without intravenous contrast. COMPARISON: 10/31/2012 FINDINGS: Moderate global atrophy. Chronic ischemic changes in the periventricular white matter. There is no mass effect, midline shift, or acute hemorrhage. Vessels are diffusely ectatic. Mastoid air cells and visualized paranasal sinuses are clear. IMPRESSION: No acute intracranial pathology. Electronically Signed By: Marybelle Killings M.D. On: 09/18/2015 20:38      PERTINENT LAB RESULTS: CBC:  Recent Labs (last 2 labs)      Recent Labs  09/18/15 1814 09/19/15 0642  WBC 11.6* 10.3  HGB 16.5 14.0  HCT 46.5 39.9  PLT 152 134*     CMET CMP  Labs (Brief)       Component Value Date/Time   NA 136 09/19/2015 0642   K 3.6 09/19/2015 0642   CL 102 09/19/2015 0642   CO2 25 09/19/2015 0642   GLUCOSE 102* 09/19/2015 0642   BUN 15 09/19/2015 0642   CREATININE 1.09 09/19/2015 0642   CALCIUM 9.1 09/19/2015 0642   PROT 6.9 04/04/2015 1204   ALBUMIN 4.1 04/04/2015 1204   AST 25 04/04/2015 1204   ALT 23 04/04/2015 1204   ALKPHOS 61 04/04/2015 1204   BILITOT 1.1 04/04/2015 1204   GFRNONAA >60 09/19/2015 0642   GFRAA >60 09/19/2015 0642      GFR Estimated Creatinine Clearance: 49.9 mL/min (by C-G formula based on Cr of 1.09).  Recent Labs (last 2 labs)     No results for input(s): LIPASE, AMYLASE in the last 72 hours.    Recent Labs (last 2 labs)      Recent Labs  09/19/15 0131 09/19/15 0642 09/19/15 1158  TROPONINI 0.04* 0.04* 0.04*      Recent Labs (last 2 labs)     Invalid input(s): POCBNP    Recent Labs (last 2 labs)     No results for input(s): DDIMER in the last 72 hours.    Recent Labs (last 2 labs)     No results for input(s): HGBA1C in  the last 72 hours.    Recent Labs (last 2 labs)      Recent Labs  09/19/15 0131  CHOL 185  HDL 46  LDLCALC 121*  TRIG 88  CHOLHDL 4.0      Recent Labs (last 2 labs)      Recent Labs  09/19/15 0131  TSH 0.722      Recent Labs (last 2 labs)     No results for input(s): VITAMINB12, FOLATE, FERRITIN, TIBC, IRON, RETICCTPCT in the last 72 hours.   Coags:  Recent Labs (last 2 labs)      Recent Labs  09/19/15 0642  INR 2.56*     Microbiology: No results found for this or any previous visit (from the past 240 hour(s)).   BRIEF HOSPITAL COURSE:  New onset atrial fibrillation: Admitted and started on Cardizem infusion, spontaneously converted to sinus rhythm. Started  metoprolol 25 mg twice a day, continue Xarelto (CHADSVASC = 4). Note initially patient and spouse were very hesitant to go home with Xarelto, however since he gets a free one month supply-he is willing to try this out for a month. Patient and spouse both are aware that they will need to see their primary care doctor or primary cardiologist within 1 month to decide on long-term anticoagulation.Echocardiogram showed preserved ejection fraction. No further recommendations from cardiology, okay to discharge.   Active Problems: Uncontrolled hypertension: Better controlled-continue metoprolol, and losartan.   Dyslipidemia: Continue Zocor  TODAY-DAY OF DISCHARGE:  Subjective:   Samuel Santiago today has no headache,no chest abdominal pain,no new weakness tingling or numbness, feels much better wants to go home today.  Objective:   Blood pressure 135/80, pulse 61, temperature 98.7 F (37.1 C), temperature source Oral, resp. rate 22, height 5\' 6"  (1.676 m), weight 79.153 kg (174 lb 8 oz), SpO2 97 %.  Intake/Output Summary (Last 24 hours) at 09/19/15 1506 Last data filed at 09/19/15 1246  Gross per 24 hour  Intake  720 ml  Output  350 ml  Net  370 ml   Filed Weights    09/18/15 1748 09/19/15 0037 09/19/15 0441  Weight: 81.676 kg (180 lb 1 oz) 79.153 kg (174 lb 8 oz) 79.153 kg (174 lb 8 oz)    Exam Awake Alert, Oriented *3, No new F.N deficits, Normal affect [place].AT,PERRAL Supple Neck,No JVD, No cervical lymphadenopathy appriciated.  Symmetrical Chest wall movement, Good air movement bilaterally, CTAB RRR,No Gallops,Rubs or new Murmurs, No Parasternal Heave +ve B.Sounds, Abd Soft, Non tender, No organomegaly appriciated, No rebound -guarding or rigidity. No Cyanosis, Clubbing or edema, No new Rash or bruise  DISCHARGE CONDITION: Stable  DISPOSITION: Home with home health services        Outpatient Prescriptions Prior to Visit  Medication Sig Dispense Refill  . Cholecalciferol (VITAMIN D-3 PO) Take 1 tablet by mouth daily.     . CHONDROITIN SULFATE PO Take 1 tablet by mouth daily.    Marland Kitchen escitalopram (LEXAPRO) 10 MG tablet Take 0.5 tablets (5 mg total) by mouth daily. May increase to 10 mg in 2 weeks (Patient taking differently: Take 5 mg by mouth daily. ) 30 tablet 11  . losartan (COZAAR) 50 MG tablet Take 1 tablet (50 mg total) by mouth daily. 90 tablet 2  . metoprolol tartrate (LOPRESSOR) 25 MG tablet Take 1 tablet (25 mg total) by mouth 2 (two) times daily. 120 tablet 0  . Multiple Vitamin (MULTIVITAMIN WITH MINERALS) TABS tablet Take 1 tablet by mouth daily.    . Omega-3 Fatty Acids (FISH OIL PO) Take 1 capsule by mouth daily.    Marland Kitchen omeprazole (PRILOSEC) 20 MG capsule TAKE 1 CAPSULE BY MOUTH DAILY (Patient taking differently: TAKE 20 MG BY MOUTH DAILY) 90 capsule 3  . polyvinyl alcohol-povidone (REFRESH) 1.4-0.6 % ophthalmic solution Place 1 drop into both eyes 4 (four) times daily. as needed (for dry eyes).    . simvastatin (ZOCOR) 40 MG tablet Take 0.5 tablets (20 mg total) by mouth at bedtime. (Patient taking differently: Take 40 mg by mouth every other day. ) 90 tablet 3  . rivaroxaban (XARELTO) 20 MG TABS tablet Take 1 tablet (20 mg  total) by mouth daily with supper. 60 tablet 0   No facility-administered medications prior to visit.    ROS Review of Systems  Constitutional: Negative for appetite change, fatigue and unexpected weight change.  HENT: Negative for congestion, nosebleeds,  sneezing, sore throat and trouble swallowing.   Eyes: Negative for itching and visual disturbance.  Respiratory: Negative for cough.   Cardiovascular: Negative for chest pain, palpitations and leg swelling.  Gastrointestinal: Negative for nausea, diarrhea, blood in stool and abdominal distention.  Genitourinary: Negative for frequency and hematuria.  Musculoskeletal: Negative for back pain, joint swelling, gait problem and neck pain.  Skin: Negative for rash.  Neurological: Negative for dizziness, tremors, speech difficulty and weakness.  Psychiatric/Behavioral: Negative for sleep disturbance, dysphoric mood and agitation. The patient is not nervous/anxious.     Objective:  BP 160/88 mmHg  Pulse 56  Wt 179 lb (81.194 kg)  SpO2 95%  BP Readings from Last 3 Encounters:  09/27/15 160/88  09/19/15 135/80  08/20/15 140/80    Wt Readings from Last 3 Encounters:  09/27/15 179 lb (81.194 kg)  09/19/15 174 lb 8 oz (79.153 kg)  08/20/15 180 lb (81.647 kg)    Physical Exam  Constitutional: He is oriented to person, place, and time. He appears well-developed. No distress.  NAD  HENT:  Mouth/Throat: Oropharynx is clear and moist.  Eyes: Conjunctivae are normal. Pupils are equal, round, and reactive to light.  Neck: Normal range of motion. No JVD present. No thyromegaly present.  Cardiovascular: Normal rate, regular rhythm, normal heart sounds and intact distal pulses.  Exam reveals no gallop and no friction rub.   No murmur heard. Pulmonary/Chest: Effort normal and breath sounds normal. No respiratory distress. He has no wheezes. He has no rales. He exhibits no tenderness.  Abdominal: Soft. Bowel sounds are normal. He exhibits no  distension and no mass. There is no tenderness. There is no rebound and no guarding.  Musculoskeletal: Normal range of motion. He exhibits no edema or tenderness.  Lymphadenopathy:    He has no cervical adenopathy.  Neurological: He is alert and oriented to person, place, and time. He has normal reflexes. No cranial nerve deficit. He exhibits normal muscle tone. He displays a negative Romberg sign. Coordination and gait normal.  Skin: Skin is warm and dry. No rash noted.  Psychiatric: He has a normal mood and affect. His behavior is normal. Judgment and thought content normal.  HR reg w/irreg beats, brady blepharitis  Lab Results  Component Value Date   WBC 10.3 09/19/2015   HGB 14.0 09/19/2015   HCT 39.9 09/19/2015   PLT 134* 09/19/2015   GLUCOSE 102* 09/19/2015   CHOL 185 09/19/2015   TRIG 88 09/19/2015   HDL 46 09/19/2015   LDLCALC 121* 09/19/2015   ALT 23 04/04/2015   AST 25 04/04/2015   NA 136 09/19/2015   K 3.6 09/19/2015   CL 102 09/19/2015   CREATININE 1.09 09/19/2015   BUN 15 09/19/2015   CO2 25 09/19/2015   TSH 0.722 09/19/2015   PSA 1.12 01/27/2013   INR 2.56* 09/19/2015   HGBA1C 5.9* 09/19/2015    Dg Chest 2 View  09/18/2015  CLINICAL DATA:  Hypertension tachycardia EXAM: CHEST  2 VIEW COMPARISON:  None. FINDINGS: Mild cardiac enlargement and vascular congestion. No pulmonary edema consolidation or effusion. IMPRESSION: Cardiac enlargement with vascular congestion but no pulmonary edema. Electronically Signed   By: Skipper Cliche M.D.   On: 09/18/2015 20:14   Ct Head Wo Contrast  09/18/2015  CLINICAL DATA:  Headache EXAM: CT HEAD WITHOUT CONTRAST TECHNIQUE: Contiguous axial images were obtained from the base of the skull through the vertex without intravenous contrast. COMPARISON:  10/31/2012 FINDINGS: Moderate global atrophy. Chronic ischemic changes  in the periventricular white matter. There is no mass effect, midline shift, or acute hemorrhage. Vessels are  diffusely ectatic. Mastoid air cells and visualized paranasal sinuses are clear. IMPRESSION: No acute intracranial pathology. Electronically Signed   By: Marybelle Killings M.D.   On: 09/18/2015 20:38    Assessment & Plan:   Diagnoses and all orders for this visit:  Essential hypertension  Dyslipidemia  Atrial fibrillation with rapid ventricular response (HCC)  Memory loss  Other orders -     rivaroxaban (XARELTO) 20 MG TABS tablet; Take 1 tablet (20 mg total) by mouth daily with supper. -     donepezil (ARICEPT) 5 MG tablet; Take 1 tablet (5 mg total) by mouth at bedtime.  I am having Samuel Santiago start on donepezil. I am also having him maintain his multivitamin with minerals, Omega-3 Fatty Acids (FISH OIL PO), Cholecalciferol (VITAMIN D-3 PO), polyvinyl alcohol-povidone, simvastatin, losartan, omeprazole, escitalopram, CHONDROITIN SULFATE PO, metoprolol tartrate, and rivaroxaban.  Meds ordered this encounter  Medications  . rivaroxaban (XARELTO) 20 MG TABS tablet    Sig: Take 1 tablet (20 mg total) by mouth daily with supper.    Dispense:  60 tablet    Refill:  5  . donepezil (ARICEPT) 5 MG tablet    Sig: Take 1 tablet (5 mg total) by mouth at bedtime.    Dispense:  30 tablet    Refill:  11     Follow-up: Return in about 3 months (around 12/26/2015) for a follow-up visit.  Walker Kehr, MD

## 2015-09-27 NOTE — Assessment & Plan Note (Addendum)
Xarelto  Potential benefits of a long term Xarelto  use as well as potential risks  and complications were explained to the patient and were aknowledged. Now in S brady w/irreg beats on Toprol

## 2015-09-27 NOTE — Assessment & Plan Note (Addendum)
On Simvastatin Hold Zocor if leg weakness gets worse

## 2015-10-08 ENCOUNTER — Ambulatory Visit (INDEPENDENT_AMBULATORY_CARE_PROVIDER_SITE_OTHER): Payer: Medicare HMO | Admitting: Nurse Practitioner

## 2015-10-08 ENCOUNTER — Encounter: Payer: Self-pay | Admitting: Nurse Practitioner

## 2015-10-08 VITALS — BP 178/98 | HR 54 | Ht 66.0 in | Wt 183.8 lb

## 2015-10-08 DIAGNOSIS — I10 Essential (primary) hypertension: Secondary | ICD-10-CM | POA: Diagnosis not present

## 2015-10-08 DIAGNOSIS — I480 Paroxysmal atrial fibrillation: Secondary | ICD-10-CM

## 2015-10-08 DIAGNOSIS — I2510 Atherosclerotic heart disease of native coronary artery without angina pectoris: Secondary | ICD-10-CM | POA: Diagnosis not present

## 2015-10-08 MED ORDER — WARFARIN SODIUM 5 MG PO TABS: 5.0000 mg | ORAL_TABLET | Freq: Every day | ORAL | Status: DC

## 2015-10-08 MED ORDER — LOSARTAN POTASSIUM 100 MG PO TABS: 100.0000 mg | ORAL_TABLET | Freq: Every day | ORAL | Status: DC

## 2015-10-08 NOTE — Patient Instructions (Addendum)
We will be checking the following labs today - NONE   Medication Instructions:    Continue with your current medicines. But  I am starting you on Coumadin 5 mg - take one pill each evening - start today.  STOP Xarelto after Wednesday's dose.   Coumadin check next Tuesday  Increase Losartan to 100 mg each day - you can take 2 of the 50mg  to make 100 mg - and use those up - I have sent the RX for the 100 mg to the drug store  Stay off Zocor for now - we will think about Lipitor on return.     Testing/Procedures To Be Arranged:  N/A  Follow-Up:   See me in one month    Other Special Instructions:   Keep a check on blood pressure for me - call if consistently above 160 on the top #     If you need a refill on your cardiac medications before your next appointment, please call your pharmacy.   Call the [place] office at [phone] if you have any questions, problems or concerns.
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
## 2015-10-16 ENCOUNTER — Ambulatory Visit (INDEPENDENT_AMBULATORY_CARE_PROVIDER_SITE_OTHER): Payer: Medicare HMO | Admitting: Pharmacist

## 2015-10-16 DIAGNOSIS — Z7901 Long term (current) use of anticoagulants: Secondary | ICD-10-CM | POA: Diagnosis not present

## 2015-10-16 DIAGNOSIS — I4891 Unspecified atrial fibrillation: Secondary | ICD-10-CM

## 2015-10-16 DIAGNOSIS — Z5181 Encounter for therapeutic drug level monitoring: Secondary | ICD-10-CM

## 2015-10-16 LAB — POCT INR: INR: 5.9

## 2015-10-23 ENCOUNTER — Ambulatory Visit (INDEPENDENT_AMBULATORY_CARE_PROVIDER_SITE_OTHER): Payer: Medicare HMO | Admitting: *Deleted

## 2015-10-23 DIAGNOSIS — I4891 Unspecified atrial fibrillation: Secondary | ICD-10-CM | POA: Diagnosis not present

## 2015-10-23 DIAGNOSIS — Z7901 Long term (current) use of anticoagulants: Secondary | ICD-10-CM

## 2015-10-23 DIAGNOSIS — Z5181 Encounter for therapeutic drug level monitoring: Secondary | ICD-10-CM

## 2015-10-23 LAB — POCT INR: INR: 5

## 2015-10-23 MED ORDER — WARFARIN SODIUM 2.5 MG PO TABS: ORAL_TABLET | ORAL | Status: DC

## 2015-11-01 ENCOUNTER — Ambulatory Visit (INDEPENDENT_AMBULATORY_CARE_PROVIDER_SITE_OTHER): Payer: Medicare HMO | Admitting: *Deleted

## 2015-11-01 DIAGNOSIS — Z5181 Encounter for therapeutic drug level monitoring: Secondary | ICD-10-CM

## 2015-11-01 DIAGNOSIS — I4891 Unspecified atrial fibrillation: Secondary | ICD-10-CM

## 2015-11-01 DIAGNOSIS — Z7901 Long term (current) use of anticoagulants: Secondary | ICD-10-CM | POA: Diagnosis not present

## 2015-11-01 LAB — POCT INR: INR: 3.5

## 2015-11-09 ENCOUNTER — Ambulatory Visit (INDEPENDENT_AMBULATORY_CARE_PROVIDER_SITE_OTHER): Payer: Medicare HMO | Admitting: Nurse Practitioner

## 2015-11-09 ENCOUNTER — Ambulatory Visit (INDEPENDENT_AMBULATORY_CARE_PROVIDER_SITE_OTHER): Payer: Medicare HMO | Admitting: *Deleted

## 2015-11-09 ENCOUNTER — Encounter: Payer: Self-pay | Admitting: Nurse Practitioner

## 2015-11-09 VITALS — BP 180/88 | HR 56 | Ht 66.0 in | Wt 180.8 lb

## 2015-11-09 DIAGNOSIS — I4891 Unspecified atrial fibrillation: Secondary | ICD-10-CM | POA: Diagnosis not present

## 2015-11-09 DIAGNOSIS — Z7901 Long term (current) use of anticoagulants: Secondary | ICD-10-CM

## 2015-11-09 DIAGNOSIS — E785 Hyperlipidemia, unspecified: Secondary | ICD-10-CM

## 2015-11-09 DIAGNOSIS — I2510 Atherosclerotic heart disease of native coronary artery without angina pectoris: Secondary | ICD-10-CM

## 2015-11-09 DIAGNOSIS — I10 Essential (primary) hypertension: Secondary | ICD-10-CM

## 2015-11-09 DIAGNOSIS — I480 Paroxysmal atrial fibrillation: Secondary | ICD-10-CM | POA: Diagnosis not present

## 2015-11-09 DIAGNOSIS — Z5181 Encounter for therapeutic drug level monitoring: Secondary | ICD-10-CM | POA: Diagnosis not present

## 2015-11-09 LAB — POCT INR: INR: 2

## 2015-11-09 LAB — BASIC METABOLIC PANEL
BUN: 20 mg/dL (ref 7–25)
CO2: 27 mmol/L (ref 20–31)
Calcium: 9.4 mg/dL (ref 8.6–10.3)
Chloride: 102 mmol/L (ref 98–110)
Creat: 0.83 mg/dL (ref 0.70–1.11)
Glucose, Bld: 107 mg/dL — ABNORMAL HIGH (ref 65–99)
Potassium: 4 mmol/L (ref 3.5–5.3)
Sodium: 138 mmol/L (ref 135–146)

## 2015-11-09 MED ORDER — HYDROCHLOROTHIAZIDE 25 MG PO TABS: 25.0000 mg | ORAL_TABLET | Freq: Every day | ORAL | Status: DC

## 2015-11-09 NOTE — Patient Instructions (Addendum)
We will be checking the following labs today - BMET  Medication Instructions:    Continue with your current medicines. BUT  I am adding back HCTZ 25 mg daily to your regimen.    Testing/Procedures To Be Arranged:  N/A  Follow-Up:   See me in one month  BMET with next coumadin check    Other Special Instructions:   Continue to monitor BP at home  Try to be a little more active if you can.     If you need a refill on your cardiac medications before your next appointment, please call your pharmacy.   Call the [place] office at [phone] if you have any questions, problems or concerns.
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
## 2015-11-16 ENCOUNTER — Other Ambulatory Visit: Payer: Self-pay | Admitting: Nurse Practitioner

## 2015-11-18 ENCOUNTER — Observation Stay (HOSPITAL_COMMUNITY)
Admission: EM | Admit: 2015-11-18 | Discharge: 2015-11-20 | Disposition: A | Discharge status: Home / Self Care | Payer: Medicare HMO | Attending: Internal Medicine | Admitting: Internal Medicine

## 2015-11-18 ENCOUNTER — Encounter (HOSPITAL_COMMUNITY): Payer: Self-pay

## 2015-11-18 ENCOUNTER — Emergency Department (HOSPITAL_COMMUNITY): Payer: Medicare HMO

## 2015-11-18 DIAGNOSIS — K219 Gastro-esophageal reflux disease without esophagitis: Secondary | ICD-10-CM | POA: Insufficient documentation

## 2015-11-18 DIAGNOSIS — I2510 Atherosclerotic heart disease of native coronary artery without angina pectoris: Secondary | ICD-10-CM | POA: Diagnosis present

## 2015-11-18 DIAGNOSIS — E785 Hyperlipidemia, unspecified: Secondary | ICD-10-CM | POA: Diagnosis not present

## 2015-11-18 DIAGNOSIS — R4781 Slurred speech: Principal | ICD-10-CM | POA: Insufficient documentation

## 2015-11-18 DIAGNOSIS — I4891 Unspecified atrial fibrillation: Secondary | ICD-10-CM | POA: Diagnosis present

## 2015-11-18 DIAGNOSIS — Z7901 Long term (current) use of anticoagulants: Secondary | ICD-10-CM | POA: Diagnosis not present

## 2015-11-18 DIAGNOSIS — G459 Transient cerebral ischemic attack, unspecified: Secondary | ICD-10-CM | POA: Diagnosis present

## 2015-11-18 DIAGNOSIS — R791 Abnormal coagulation profile: Secondary | ICD-10-CM | POA: Insufficient documentation

## 2015-11-18 DIAGNOSIS — Z85038 Personal history of other malignant neoplasm of large intestine: Secondary | ICD-10-CM | POA: Insufficient documentation

## 2015-11-18 DIAGNOSIS — Z87891 Personal history of nicotine dependence: Secondary | ICD-10-CM | POA: Insufficient documentation

## 2015-11-18 DIAGNOSIS — R531 Weakness: Secondary | ICD-10-CM | POA: Insufficient documentation

## 2015-11-18 DIAGNOSIS — Z955 Presence of coronary angioplasty implant and graft: Secondary | ICD-10-CM | POA: Insufficient documentation

## 2015-11-18 DIAGNOSIS — D72829 Elevated white blood cell count, unspecified: Secondary | ICD-10-CM

## 2015-11-18 DIAGNOSIS — M1990 Unspecified osteoarthritis, unspecified site: Secondary | ICD-10-CM | POA: Diagnosis not present

## 2015-11-18 DIAGNOSIS — F0390 Unspecified dementia without behavioral disturbance: Secondary | ICD-10-CM | POA: Diagnosis not present

## 2015-11-18 DIAGNOSIS — R4701 Aphasia: Secondary | ICD-10-CM | POA: Diagnosis present

## 2015-11-18 DIAGNOSIS — R471 Dysarthria and anarthria: Secondary | ICD-10-CM | POA: Diagnosis not present

## 2015-11-18 DIAGNOSIS — F809 Developmental disorder of speech and language, unspecified: Secondary | ICD-10-CM

## 2015-11-18 DIAGNOSIS — I10 Essential (primary) hypertension: Secondary | ICD-10-CM | POA: Diagnosis not present

## 2015-11-18 DIAGNOSIS — R479 Unspecified speech disturbances: Secondary | ICD-10-CM

## 2015-11-18 DIAGNOSIS — R402411 Glasgow coma scale score 13-15, in the field [EMT or ambulance]: Secondary | ICD-10-CM | POA: Diagnosis not present

## 2015-11-18 DIAGNOSIS — I480 Paroxysmal atrial fibrillation: Secondary | ICD-10-CM | POA: Insufficient documentation

## 2015-11-18 DIAGNOSIS — R262 Difficulty in walking, not elsewhere classified: Secondary | ICD-10-CM | POA: Insufficient documentation

## 2015-11-18 DIAGNOSIS — I252 Old myocardial infarction: Secondary | ICD-10-CM | POA: Insufficient documentation

## 2015-11-18 DIAGNOSIS — Z5181 Encounter for therapeutic drug level monitoring: Secondary | ICD-10-CM

## 2015-11-18 DIAGNOSIS — Z7982 Long term (current) use of aspirin: Secondary | ICD-10-CM | POA: Diagnosis not present

## 2015-11-18 DIAGNOSIS — I639 Cerebral infarction, unspecified: Secondary | ICD-10-CM | POA: Diagnosis present

## 2015-11-18 DIAGNOSIS — T45515A Adverse effect of anticoagulants, initial encounter: Secondary | ICD-10-CM | POA: Insufficient documentation

## 2015-11-18 DIAGNOSIS — R4182 Altered mental status, unspecified: Secondary | ICD-10-CM | POA: Diagnosis not present

## 2015-11-18 DIAGNOSIS — R69 Illness, unspecified: Secondary | ICD-10-CM | POA: Diagnosis not present

## 2015-11-18 HISTORY — DX: Unspecified dementia, unspecified severity, without behavioral disturbance, psychotic disturbance, mood disturbance, and anxiety: F03.90

## 2015-11-18 HISTORY — DX: Heart failure, unspecified: I50.9

## 2015-11-18 HISTORY — DX: Cardiac arrhythmia, unspecified: I49.9

## 2015-11-18 HISTORY — DX: Acute myocardial infarction, unspecified: I21.9

## 2015-11-18 HISTORY — DX: Atherosclerotic heart disease of native coronary artery without angina pectoris: I25.10

## 2015-11-18 LAB — URINALYSIS, ROUTINE W REFLEX MICROSCOPIC
BILIRUBIN URINE: NEGATIVE
GLUCOSE, UA: NEGATIVE mg/dL
Hgb urine dipstick: NEGATIVE
KETONES UR: 15 mg/dL — AB
LEUKOCYTES UA: NEGATIVE
NITRITE: NEGATIVE
PROTEIN: 30 mg/dL — AB
Specific Gravity, Urine: 1.019 (ref 1.005–1.030)
pH: 7 (ref 5.0–8.0)

## 2015-11-18 LAB — COMPREHENSIVE METABOLIC PANEL
ALBUMIN: 3.5 g/dL (ref 3.5–5.0)
ALK PHOS: 67 U/L (ref 38–126)
ALT: 25 U/L (ref 17–63)
AST: 25 U/L (ref 15–41)
Anion gap: 12 (ref 5–15)
BUN: 25 mg/dL — AB (ref 6–20)
CALCIUM: 10 mg/dL (ref 8.9–10.3)
CO2: 26 mmol/L (ref 22–32)
CREATININE: 1.04 mg/dL (ref 0.61–1.24)
Chloride: 101 mmol/L (ref 101–111)
GFR calc Af Amer: >60 mL/min (ref >60)
GFR calc non Af Amer: >60 mL/min (ref >60)
GLUCOSE: 160 mg/dL — AB (ref 65–99)
Potassium: 3.8 mmol/L (ref 3.5–5.1)
SODIUM: 139 mmol/L (ref 135–145)
Total Bilirubin: 1.6 mg/dL — ABNORMAL HIGH (ref 0.3–1.2)
Total Protein: 6.6 g/dL (ref 6.5–8.1)

## 2015-11-18 LAB — CBC WITH DIFFERENTIAL/PLATELET
BASOS PCT: 0 %
Basophils Absolute: 0 10*3/uL (ref 0.0–0.1)
EOS ABS: 0.1 10*3/uL (ref 0.0–0.7)
Eosinophils Relative: 1 %
HCT: 45.1 % (ref 39.0–52.0)
Hemoglobin: 16.1 g/dL (ref 13.0–17.0)
Lymphocytes Relative: 7 %
Lymphs Abs: 0.9 10*3/uL (ref 0.7–4.0)
MCH: 35.2 pg — ABNORMAL HIGH (ref 26.0–34.0)
MCHC: 35.7 g/dL (ref 30.0–36.0)
MCV: 98.5 fL (ref 78.0–100.0)
MONO ABS: 0.7 10*3/uL (ref 0.1–1.0)
MONOS PCT: 5 %
Neutro Abs: 12.2 10*3/uL — ABNORMAL HIGH (ref 1.7–7.7)
Neutrophils Relative %: 87 %
Platelets: 147 10*3/uL — ABNORMAL LOW (ref 150–400)
RBC: 4.58 MIL/uL (ref 4.22–5.81)
RDW: 12.7 % (ref 11.5–15.5)
WBC: 13.9 10*3/uL — ABNORMAL HIGH (ref 4.0–10.5)

## 2015-11-18 LAB — RAPID URINE DRUG SCREEN, HOSP PERFORMED
AMPHETAMINES: NOT DETECTED
BARBITURATES: NOT DETECTED
Benzodiazepines: NOT DETECTED
COCAINE: NOT DETECTED
Opiates: NOT DETECTED
TETRAHYDROCANNABINOL: NOT DETECTED

## 2015-11-18 LAB — URINE MICROSCOPIC-ADD ON

## 2015-11-18 LAB — PROTIME-INR
INR: 1.49 (ref 0.00–1.49)
Prothrombin Time: 18.1 seconds — ABNORMAL HIGH (ref 11.6–15.2)

## 2015-11-18 LAB — TROPONIN I

## 2015-11-18 LAB — ETHANOL

## 2015-11-18 LAB — APTT: aPTT: 39 seconds — ABNORMAL HIGH (ref 24–37)

## 2015-11-18 MED ORDER — SODIUM CHLORIDE 0.9 % IV SOLN
100.0000 mL/h | INTRAVENOUS | Status: DC
  Administered 2015-11-19: 100 mL/h via INTRAVENOUS

## 2015-11-18 MED ORDER — ONDANSETRON HCL 4 MG/2ML IJ SOLN
4.0000 mg | Freq: Once | INTRAMUSCULAR | Status: AC
  Administered 2015-11-18: 4 mg via INTRAVENOUS
  Filled 2015-11-18: qty 2

## 2015-11-18 MED ORDER — LORAZEPAM 2 MG/ML IJ SOLN
1.0000 mg | INTRAMUSCULAR | Status: DC | PRN
  Administered 2015-11-18: 1 mg via INTRAVENOUS
  Filled 2015-11-18: qty 1

## 2015-11-18 MED ORDER — SODIUM CHLORIDE 0.9 % IV BOLUS (SEPSIS)
500.0000 mL | Freq: Once | INTRAVENOUS | Status: AC
  Administered 2015-11-18: 500 mL via INTRAVENOUS

## 2015-11-18 NOTE — ED Notes (Signed)
Per EMS: Pt became altered tonight. Pt states he was slurring words and not following commands. Pt has baseline dementia. Pt a/o to self only. Denies any pain/SOB.

## 2015-11-18 NOTE — ED Provider Notes (Signed)
CSN: [id number]     Arrival date & time 11/18/15  1930 History   First MD Initiated Contact with Patient 11/18/15 1932     Chief Complaint  Patient presents with  . Altered Mental Status     (Consider location/radiation/quality/duration/timing/severity/associated sxs/prior Treatment) HPI Patient presents with his daughter and wife, who assisted the history of present illness. Patient is a poor historian, has apparent difficulty with receptive aphasia, and is very slow to respond, level V caveat. In addition, prior notes suggest the patient has dementia, though this is not on his problem list. However, the patient takes medication for dementia. Family states that about 4 hours prior to arrival the became aware that the patient was slower to respond, weaker than his baseline generalized weakness. Patient did not have a fall. Since that time the patient has had persistent more pronounced weakness globally, but does not complain about any specific weakness. Patient is oriented to self only, grossly to place. Family states the patient has no recent medication changes, dietary changes, activity changes. They describe a history of colon cancer, 2, chronic anticoagulation for atrial fibrillation.  Past Medical History  Diagnosis Date  . Hyperlipidemia   . Colon cancer ([place]) 1990    Had recurrence in 2003 resultinbg in chemotherapy. He recently had a colonoscopy and  had a polyp removed that was benign.  . Hypertension   . Ischemic heart disease     has had remote MI in 1997 and was treated with TPA. Prior PCI to RCA with repeat PCI to the RCA in 2003  . Kidney stone     x11  . Normal nuclear stress test Feb 2012    No significant ischemia. EF 56%; with basal inferior scar  . History of nephrolithiasis 01/23/2012  . Arthritis   . History of colon polyps   . GERD (gastroesophageal reflux disease)   . PAF (paroxysmal atrial fibrillation) ([place])   . Chronic anticoagulation    Past Surgical  History  Procedure Laterality Date  . Colonoscopy      colon cancer  . Coronary stent placement  1997 & 2003    PCI to RCA x 2  . Colon surgery      x 2 for cancer  . Eye surgery     Family History  Problem Relation Age of Onset  . Arthritis Sister   . Breast cancer Sister   . Colon polyps Son   . Colon polyps Daughter    Social History  Substance Use Topics  . Smoking status: Former Smoker    Quit date: 10/20/1985  . Smokeless tobacco: Never Used  . Alcohol Use: No    Review of Systems  Unable to perform ROS: Mental status change      Allergies  Tape  Home Medications   Prior to Admission medications   Medication Sig Start Date End Date Taking? Authorizing Provider  acetaminophen (TYLENOL) 500 MG tablet Take 500 mg by mouth every 6 (six) hours as needed (pain).   Yes Historical Provider, MD  Cholecalciferol (VITAMIN D-3 PO) Take 1 tablet by mouth daily.    Yes Historical Provider, MD  donepezil (ARICEPT) 5 MG tablet Take 1 tablet (5 mg total) by mouth at bedtime. 09/27/15  Yes Aleksei Plotnikov V, MD  escitalopram (LEXAPRO) 10 MG tablet Take 10 mg by mouth daily.   Yes Historical Provider, MD  GLUCOSAMINE-CHONDROITIN PO Take 1 tablet by mouth daily.   Yes Historical Provider, MD  hydrochlorothiazide (HYDRODIURIL) 25 MG  tablet Take 1 tablet (25 mg total) by mouth daily. Patient taking differently: Take 25 mg by mouth at bedtime.  11/09/15  Yes Burtis Junes, NP  losartan (COZAAR) 100 MG tablet Take 1 tablet (100 mg total) by mouth daily. 10/08/15  Yes Burtis Junes, NP  metoprolol tartrate (LOPRESSOR) 25 MG tablet TAKE ONE TABLET BY MOUTH TWICE DAILY 11/16/15  Yes Larey Dresser, MD  Multiple Vitamin (MULTIVITAMIN WITH MINERALS) TABS tablet Take 1 tablet by mouth daily.   Yes Historical Provider, MD  Omega-3 Fatty Acids (FISH OIL PO) Take 1 capsule by mouth daily.   Yes Historical Provider, MD  omeprazole (PRILOSEC) 20 MG capsule Take 20 mg by mouth daily.   Yes  Historical Provider, MD  polyvinyl alcohol-povidone (REFRESH) 1.4-0.6 % ophthalmic solution Place 1 drop into both eyes 4 (four) times daily as needed (dry eyes).    Yes Historical Provider, MD  warfarin (COUMADIN) 2.5 MG tablet Take as directed by coumadin clinic Patient taking differently: Take 0-1.25 mg by mouth daily with supper. Take 1/2 tablet (1.25 mg) by mouth daily except on Thursday, do not take any warfarin on Thursdays - or as directed by coumadin clinic 10/23/15  Yes Larey Dresser, MD   There were no vitals taken for this visit. Physical Exam  Constitutional: He appears well-developed.  Obese elderly male in no distress, is to self, answering question is very slowly, inconsistently.   HENT:  Head: Normocephalic and atraumatic.  Eyes: Conjunctivae and EOM are normal.  Cardiovascular: Normal rate and regular rhythm.   Pulmonary/Chest: Effort normal. No stridor. No respiratory distress.  Abdominal: He exhibits no distension.  Musculoskeletal: He exhibits no edema.  Neurological: He is alert. He displays atrophy. He displays no tremor. No cranial nerve deficit or sensory deficit. He exhibits normal muscle tone. He displays no seizure activity.  Skin: Skin is warm and dry.  Psychiatric: He is slowed and withdrawn. Cognition and memory are impaired.  Nursing note and vitals reviewed.   ED Course  Procedures (including critical care time) Labs Review Labs Reviewed  APTT - Abnormal; Notable for the following:    aPTT 39 (*)    All other components within normal limits  COMPREHENSIVE METABOLIC PANEL - Abnormal; Notable for the following:    Glucose, Bld 160 (*)    BUN 25 (*)    Total Bilirubin 1.6 (*)    All other components within normal limits  CBC WITH DIFFERENTIAL/PLATELET - Abnormal; Notable for the following:    WBC 13.9 (*)    MCH 35.2 (*)    Platelets 147 (*)    Neutro Abs 12.2 (*)    All other components within normal limits  PROTIME-INR - Abnormal; Notable for  the following:    Prothrombin Time 18.1 (*)    All other components within normal limits  URINALYSIS, ROUTINE W REFLEX MICROSCOPIC (NOT AT [facility]) - Abnormal; Notable for the following:    APPearance CLOUDY (*)    Ketones, ur 15 (*)    Protein, ur 30 (*)    All other components within normal limits  URINE MICROSCOPIC-ADD ON - Abnormal; Notable for the following:    Squamous Epithelial / LPF 0-5 (*)    Bacteria, UA RARE (*)    All other components within normal limits  ETHANOL  TROPONIN I  URINE RAPID DRUG SCREEN, HOSP PERFORMED    Imaging Review Ct Head Wo Contrast  11/18/2015  CLINICAL DATA:  Confusion and combativeness. Altered mental  status. Concern for stroke. EXAM: CT HEAD WITHOUT CONTRAST TECHNIQUE: Contiguous axial images were obtained from the base of the skull through the vertex without intravenous contrast. COMPARISON:  09/18/2015 FINDINGS: Moderate atrophy and advanced chronic small vessel ischemic change, similar in degree to prior exam.No intracranial hemorrhage, mass effect, or midline shift. No hydrocephalus. The basilar cisterns are patent. No evidence of territorial infarct or acute ischemia. No intracranial fluid collection. Calvarium is intact. Included paranasal sinuses are well aerated. Mild opacification of lower left mastoid air cells appears chronic. IMPRESSION: Moderate atrophy and advanced chronic small vessel ischemia. No CT findings of acute ischemia or acute process. Electronically Signed   By: Jeb Levering M.D.   On: 11/18/2015 20:53   I have personally reviewed and evaluated these images and lab results as part of my medical decision-making.   EKG Interpretation   Date/Time:  Sunday November 18 2015 19:45:46 EST Ventricular Rate:  61 PR Interval:  159 QRS Duration: 105 QT Interval:  470 QTC Calculation: 473 R Axis:   -6 Text Interpretation:  Sinus rhythm Posterior infarct, old Sinus rhythm  Artifact T wave abnormality Abnormal ekg Confirmed by  Carmin Muskrat  MD  [phone]) on 11/18/2015 8:03:13 PM     Chart review performed. Patient has a history of colon cancer, prior notes describe dementia as well.  Update: Patient in no distress. Results discussed the patient's family members. I also discussed patient's case with our neurology colleagues.  Patient will be admitted for concern of stroke in his persistent receptive aphasia.  MDM  Patient presents with family members who assist with the history of present illness. Unclear when the patient was last seen normal time was, but about 4 hours prior to ED arrival, the patient was noted to be weaker than usual, trying to stand from his chair. Patient also had delayed reaction time, less cognizant speech. Here the patient has evidence for receptive aphasia, as well as generalized weakness. Patient's initial CT demonstrates atrophy. After discussion with neurology, and some ongoing concern for stroke, versus progression of dementia, the patient was admitted for further evaluation, management.  Carmin Muskrat, MD 11/18/15 [id number]

## 2015-11-18 NOTE — H&P (Signed)
PCP: Walker Kehr, MD  Card[facility]   Referring provider Vanita Panda   Chief Complaint:  Confusion and slurred speech  HPI: Samuel Santiago is a 82 y.o. male   has a past medical history of Hyperlipidemia; Colon cancer ([place]) (1990); Hypertension; Ischemic heart disease; Kidney stone; Normal nuclear stress test (Feb 2012); History of nephrolithiasis (01/23/2012); Arthritis; History of colon polyps; GERD (gastroesophageal reflux disease); PAF (paroxysmal atrial fibrillation) ([place]); and Chronic anticoagulation.   Presented with patient went for a nap around noon he woke up at 5 PM with a headache and felt to be more confused than his usual self when he tried to stand up he fell due to weakness patient had trouble understanding him in had trouble with speech or evidence of dysarthria, no facial droop, Did not appear weak on one side to the family. Family brought him to emergency department  IN ER: Neurology has been consulted, wbc 13.9, CT head showed moderate atrophy in no acute ischemia   Regarding pertinent past history: Patient has history of atrial fibrillation has been on Coumadin for his nausea day subtherapeutic at 1.49. Patient has known history of colon cancer currently in remission. His history of ischemic heart disease followed by cardiology last cardiac stent was 2003 asked echogram was in February 2012 normal stress test showing EF of 56%. In November 2016 echogram showing grade 1 diastolic dysfunction EF preserved at 50-55 percent  Hospitalist was called for admission for TIA versus CVA  Review of Systems:    Pertinent positives include: slurred speech,  Confusion, nausea  Constitutional:  No weight loss, night sweats, Fevers, chills, fatigue, weight loss  HEENT:  No headaches, Difficulty swallowing,Tooth/dental problems,Sore throat,  No sneezing, itching, ear ache, nasal congestion, post nasal drip,  Cardio-vascular:  No chest pain, Orthopnea, PND,  anasarca, dizziness, palpitations.no Bilateral lower extremity swelling  GI:  No heartburn, indigestion, abdominal pain, nausea, vomiting, diarrhea, change in bowel habits, loss of appetite, melena, blood in stool, hematemesis Resp:  no shortness of breath at rest. No dyspnea on exertion, No excess mucus, no productive cough, No non-productive cough, No coughing up of blood.No change in color of mucus.No wheezing. Skin:  no rash or lesions. No jaundice GU:  no dysuria, change in color of urine, no urgency or frequency. No straining to urinate.  No flank pain.  Musculoskeletal:  No joint pain or no joint swelling. No decreased range of motion. No back pain.  Psych:  No change in mood or affect. No depression or anxiety. No memory loss.  Neuro: no localizing neurological complaints, no tingling, no weakness, no double vision, no gait abnormality, no   Otherwise ROS are negative except for above, 10 systems were reviewed  Past Medical History: Past Medical History  Diagnosis Date  . Hyperlipidemia   . Colon cancer ([place]) 1990    Had recurrence in 2003 resultinbg in chemotherapy. He recently had a colonoscopy and  had a polyp removed that was benign.  . Hypertension   . Ischemic heart disease     has had remote MI in 1997 and was treated with TPA. Prior PCI to RCA with repeat PCI to the RCA in 2003  . Kidney stone     x11  . Normal nuclear stress test Feb 2012    No significant ischemia. EF 56%; with basal inferior scar  . History of nephrolithiasis 01/23/2012  . Arthritis   . History of colon polyps   . GERD (gastroesophageal reflux disease)   .  PAF (paroxysmal atrial fibrillation) ([place])   . Chronic anticoagulation    Past Surgical History  Procedure Laterality Date  . Colonoscopy      colon cancer  . Coronary stent placement  1997 & 2003    PCI to RCA x 2  . Colon surgery      x 2 for cancer  . Eye surgery       Medications: Prior to Admission medications   Medication  Sig Start Date End Date Taking? Authorizing Provider  acetaminophen (TYLENOL) 500 MG tablet Take 500 mg by mouth every 6 (six) hours as needed (pain).   Yes Historical Provider, MD  Cholecalciferol (VITAMIN D-3 PO) Take 1 tablet by mouth daily.    Yes Historical Provider, MD  donepezil (ARICEPT) 5 MG tablet Take 1 tablet (5 mg total) by mouth at bedtime. 09/27/15  Yes Aleksei Plotnikov V, MD  escitalopram (LEXAPRO) 10 MG tablet Take 10 mg by mouth daily.   Yes Historical Provider, MD  GLUCOSAMINE-CHONDROITIN PO Take 1 tablet by mouth daily.   Yes Historical Provider, MD  hydrochlorothiazide (HYDRODIURIL) 25 MG tablet Take 1 tablet (25 mg total) by mouth daily. Patient taking differently: Take 25 mg by mouth at bedtime.  11/09/15  Yes Burtis Junes, NP  losartan (COZAAR) 100 MG tablet Take 1 tablet (100 mg total) by mouth daily. 10/08/15  Yes Burtis Junes, NP  metoprolol tartrate (LOPRESSOR) 25 MG tablet TAKE ONE TABLET BY MOUTH TWICE DAILY 11/16/15  Yes Larey Dresser, MD  Multiple Vitamin (MULTIVITAMIN WITH MINERALS) TABS tablet Take 1 tablet by mouth daily.   Yes Historical Provider, MD  Omega-3 Fatty Acids (FISH OIL PO) Take 1 capsule by mouth daily.   Yes Historical Provider, MD  omeprazole (PRILOSEC) 20 MG capsule Take 20 mg by mouth daily.   Yes Historical Provider, MD  polyvinyl alcohol-povidone (REFRESH) 1.4-0.6 % ophthalmic solution Place 1 drop into both eyes 4 (four) times daily as needed (dry eyes).    Yes Historical Provider, MD  warfarin (COUMADIN) 2.5 MG tablet Take as directed by coumadin clinic Patient taking differently: Take 0-1.25 mg by mouth daily with supper. Take 1/2 tablet (1.25 mg) by mouth daily except on Thursday, do not take any warfarin on Thursdays - or as directed by coumadin clinic 10/23/15  Yes Larey Dresser, MD    Allergies:   Allergies  Allergen Reactions  . Tape Other (See Comments)    Pulled off skin one time--- "it really sticks to me" but will probably  tolerate paper tape     Social History:  Ambulatory   walker  Lives at home With family    reports that he quit smoking about 30 years ago. He has never used smokeless tobacco. He reports that he does not drink alcohol or use illicit drugs.     Family History: family history includes Arthritis in his sister; Breast cancer in his sister; Colon polyps in his daughter and son.    Physical Exam: Patient Vitals for the past 24 hrs:  BP Pulse Resp SpO2  11/18/15 2300 120/98 mmHg (!) 57 15 95 %  11/18/15 2200 151/89 mmHg 74 21 94 %  11/18/15 2100 183/79 mmHg 66 17 100 %    1. General:  in No Acute distress 2. Psychological: somnolent after getting ativan to get an MRI and Oriented 3. Head/ENT:    Dry Mucous Membranes  Head Non traumatic, neck supple                            Poor Dentition 4. SKIN:  decreased Skin turgor,  Skin clean Dry and intact no rash 5. Heart: Regular rate and rhythm no  Murmur, Rub or gallop 6. Lungs:  no wheezes or crackles   7. Abdomen: Soft, non-tender, Non distended 8. Lower extremities: no clubbing, cyanosis, or edema 9. Neurologically  Generalized weak but equal,  10. MSK: Normal range of motion  body mass index is unknown because there is no weight on file.   Labs on Admission:   Results for orders placed or performed during the hospital encounter of 11/18/15 (from the past 24 hour(s))  Urine rapid drug screen (hosp performed)not at [facility]     Status: None   Collection Time: 11/18/15  8:10 PM  Result Value Ref Range   Opiates NONE DETECTED NONE DETECTED   Cocaine NONE DETECTED NONE DETECTED   Benzodiazepines NONE DETECTED NONE DETECTED   Amphetamines NONE DETECTED NONE DETECTED   Tetrahydrocannabinol NONE DETECTED NONE DETECTED   Barbiturates NONE DETECTED NONE DETECTED  Urinalysis, Routine w reflex microscopic (not at [facility])     Status: Abnormal   Collection Time: 11/18/15  8:10 PM  Result Value Ref Range   Color,  Urine YELLOW YELLOW   APPearance CLOUDY (A) CLEAR   Specific Gravity, Urine 1.019 1.005 - 1.030   pH 7.0 5.0 - 8.0   Glucose, UA NEGATIVE NEGATIVE mg/dL   Hgb urine dipstick NEGATIVE NEGATIVE   Bilirubin Urine NEGATIVE NEGATIVE   Ketones, ur 15 (A) NEGATIVE mg/dL   Protein, ur 30 (A) NEGATIVE mg/dL   Nitrite NEGATIVE NEGATIVE   Leukocytes, UA NEGATIVE NEGATIVE  Urine microscopic-add on     Status: Abnormal   Collection Time: 11/18/15  8:10 PM  Result Value Ref Range   Squamous Epithelial / LPF 0-5 (A) NONE SEEN   WBC, UA 0-5 0 - 5 WBC/hpf   RBC / HPF 0-5 0 - 5 RBC/hpf   Bacteria, UA RARE (A) NONE SEEN  APTT     Status: Abnormal   Collection Time: 11/18/15 10:23 PM  Result Value Ref Range   aPTT 39 (H) 24 - 37 seconds  Comprehensive metabolic panel     Status: Abnormal   Collection Time: 11/18/15 10:23 PM  Result Value Ref Range   Sodium 139 135 - 145 mmol/L   Potassium 3.8 3.5 - 5.1 mmol/L   Chloride 101 101 - 111 mmol/L   CO2 26 22 - 32 mmol/L   Glucose, Bld 160 (H) 65 - 99 mg/dL   BUN 25 (H) 6 - 20 mg/dL   Creatinine, Ser 1.04 0.61 - 1.24 mg/dL   Calcium 10.0 8.9 - 10.3 mg/dL   Total Protein 6.6 6.5 - 8.1 g/dL   Albumin 3.5 3.5 - 5.0 g/dL   AST 25 15 - 41 U/L   ALT 25 17 - 63 U/L   Alkaline Phosphatase 67 38 - 126 U/L   Total Bilirubin 1.6 (H) 0.3 - 1.2 mg/dL   GFR calc non Af Amer >60 >60 mL/min   GFR calc Af Amer >60 >60 mL/min   Anion gap 12 5 - 15  CBC WITH DIFFERENTIAL     Status: Abnormal   Collection Time: 11/18/15 10:23 PM  Result Value Ref Range   WBC 13.9 (H) 4.0 - 10.5 K/uL   RBC  4.58 4.22 - 5.81 MIL/uL   Hemoglobin 16.1 13.0 - 17.0 g/dL   HCT 45.1 39.0 - 52.0 %   MCV 98.5 78.0 - 100.0 fL   MCH 35.2 (H) 26.0 - 34.0 pg   MCHC 35.7 30.0 - 36.0 g/dL   RDW 12.7 11.5 - 15.5 %   Platelets 147 (L) 150 - 400 K/uL   Neutrophils Relative % 87 %   Neutro Abs 12.2 (H) 1.7 - 7.7 K/uL   Lymphocytes Relative 7 %   Lymphs Abs 0.9 0.7 - 4.0 K/uL   Monocytes  Relative 5 %   Monocytes Absolute 0.7 0.1 - 1.0 K/uL   Eosinophils Relative 1 %   Eosinophils Absolute 0.1 0.0 - 0.7 K/uL   Basophils Relative 0 %   Basophils Absolute 0.0 0.0 - 0.1 K/uL  Ethanol     Status: None   Collection Time: 11/18/15 10:23 PM  Result Value Ref Range   Alcohol, Ethyl (B) <5 <5 mg/dL  Troponin I     Status: None   Collection Time: 11/18/15 10:23 PM  Result Value Ref Range   Troponin I <0.03 <0.031 ng/mL  Protime-INR     Status: Abnormal   Collection Time: 11/18/15 10:23 PM  Result Value Ref Range   Prothrombin Time 18.1 (H) 11.6 - 15.2 seconds   INR 1.49 0.00 - 1.49    UA  no evidence of UTI  Lab Results  Component Value Date   HGBA1C 5.9* 09/19/2015    Estimated Creatinine Clearance: 53.2 mL/min (by C-G formula based on Cr of 1.04).  BNP (last 3 results) No results for input(s): PROBNP in the last 8760 hours.  Other results:  I have pearsonaly reviewed this: ECG REPORT  Rate: 61  Rhythm: Sinus rhythm ST&T Change: No ischemic changes QTC 473  There were no vitals filed for this visit.   Cultures: No results found for: SDES, SPECREQUEST, CULT, REPTSTATUS   Radiological Exams on Admission: Ct Head Wo Contrast  11/18/2015  CLINICAL DATA:  Confusion and combativeness. Altered mental status. Concern for stroke. EXAM: CT HEAD WITHOUT CONTRAST TECHNIQUE: Contiguous axial images were obtained from the base of the skull through the vertex without intravenous contrast. COMPARISON:  09/18/2015 FINDINGS: Moderate atrophy and advanced chronic small vessel ischemic change, similar in degree to prior exam.No intracranial hemorrhage, mass effect, or midline shift. No hydrocephalus. The basilar cisterns are patent. No evidence of territorial infarct or acute ischemia. No intracranial fluid collection. Calvarium is intact. Included paranasal sinuses are well aerated. Mild opacification of lower left mastoid air cells appears chronic. IMPRESSION: Moderate atrophy  and advanced chronic small vessel ischemia. No CT findings of acute ischemia or acute process. Electronically Signed   By: Jeb Levering M.D.   On: 11/18/2015 20:53    Chart has been reviewed  Family not at  Bedside  plan of care was discussed with   Wife Wandra [facility] [id number]  Assessment/Plan 82 year old gentleman history of hypertension, coronary disease, atrial fibrillation on Coumadin subtherapeutic currently presents with dysarthria been admitted for TIA versus CVA  Present on Admission:  . TIA (transient ischemic attack)vs.  CVA -  MRI currently pending  - will admit based on TIA/CVA protocol, await results of MRA/MRI, Carotid Doppler and Echo, obtain cardiac enzymes,  ECG,   Lipid panel, TSH. Order PT/OT evaluation. Will make sure patient is on antiplatelet agent.   Neurology consulted.      . Coronary atherosclerosis stable make sure he is on aspirin,  metoprolol, check lipid panel initiate statin if needed  . Atrial fibrillation ([place]) - currently rate controlled subtherapeutic on Coumadin dose Coumadin as per pharmacy consider switching to a newer agents if unable to achieve anticoagulation on Coumadin  . Receptive aphasia will have speech pathology evaluation  . Dementia continue home medications expect some degree of sundowning   leukocytosis - no evidence of fever may be somewhat hemoconcentrated. We'll obtain chest x-rays patient had been having occasional mild cough . Essential hypertension or mass of hypertension continue metoprolol otherwise stop hydrochlorothiazide and ARB  Prophylaxis: SCD    CODE STATUS:  FULL CODE as per  family  Disposition:  likely will need placement for rehabilitation                       Other plan as per orders.  I have spent a total of 57  min on this admission   Elani Delph 11/18/2015, 12:44 AM    [facility]  Pager [phone]   after 2 AM please page floor coverage PA If 7AM-7PM, please contact the day team  taking care of the patient  [email or web address]  Password [email or web address]

## 2015-11-18 NOTE — Consult Note (Signed)
Stroke Consult Consulting Physician: Dr Vanita Panda  Chief Complaint: altered mental status HPI: Samuel Santiago is an 82 y.o. male hx of HTN, A fib (on coumadin), HLD, ? dementia brought in by family with increased confusion, slow responses, slurred speech and generalized weakness. Family reports he was normal at noon today, around 1700 noted to have a headache, seemed lethargic. Took a nap, woke up and fell due to weakness. Family unclear if generalized or unilateral weakness. They note that he seemed to not understand them, had trouble getting his words out with noted dysarthria.   No prior CVA or TIA. Recently diagnosed with A fib and started on coumadin. CT head imaging reviewed, shows no acute process.   Date last known well: 11/18/2015 Time last known well: 1200 tPA Given: no, outside tPA window, on coumadin Modified Rankin: Rankin Score=2  Past Medical History  Diagnosis Date  . Hyperlipidemia   . Colon cancer ([place]) 1990    Had recurrence in 2003 resultinbg in chemotherapy. He recently had a colonoscopy and  had a polyp removed that was benign.  . Hypertension   . Ischemic heart disease     has had remote MI in 1997 and was treated with TPA. Prior PCI to RCA with repeat PCI to the RCA in 2003  . Kidney stone     x11  . Normal nuclear stress test Feb 2012    No significant ischemia. EF 56%; with basal inferior scar  . History of nephrolithiasis 01/23/2012  . Arthritis   . History of colon polyps   . GERD (gastroesophageal reflux disease)   . PAF (paroxysmal atrial fibrillation) ([place])   . Chronic anticoagulation     Past Surgical History  Procedure Laterality Date  . Colonoscopy      colon cancer  . Coronary stent placement  1997 & 2003    PCI to RCA x 2  . Colon surgery      x 2 for cancer  . Eye surgery      Family History  Problem Relation Age of Onset  . Arthritis Sister   . Breast cancer Sister   . Colon polyps Son   . Colon polyps Daughter    Social History:   reports that he quit smoking about 30 years ago. He has never used smokeless tobacco. He reports that he does not drink alcohol or use illicit drugs.  Allergies:  Allergies  Allergen Reactions  . Tape Other (See Comments)    Pulled off skin one time--- "it really sticks to me" but will probably tolerate paper tape      (Not in a hospital admission)  ROS: Out of a complete 14 system review, the patient complains of only the following symptoms, and all other reviewed systems are negative. +weakness, speech deficits  Physical Examination: Filed Vitals:   11/18/15 2100  BP: 183/79  Pulse: 66  Resp: 17   Physical Exam  Constitutional: He appears well-developed and well-nourished.  Psych: Affect appropriate to situation Eyes: No scleral injection HENT: No OP obstrucion Head: Normocephalic.  Cardiovascular: Normal rate and regular rhythm.  Respiratory: Effort normal and breath sounds normal.  GI: Soft. Bowel sounds are normal. No distension. There is no tenderness.  Skin: WDI   Neurologic Examination: Mental Status: Lethargic but easily aroused, oriented to name only. Slow response, difficulty naming items and following commands.  Cranial Nerves: II: funduscopic exam wnl bilaterally, visual fields grossly normal, pupils equal, round, reactive to light  III,IV, VI: ptosis  not present, extra-ocular motions intact bilaterally V,VII: decreased NLF on the right, facial light touch sensation normal bilaterally VIII: hearing normal bilaterally IX,X: gag reflex present XI: trapezius strength/neck flexion strength normal bilaterally XII: tongue strength normal  Motor: Not following commands but appears to move all extremities against gravity though question less brisk movement of RLE Tone and bulk:normal tone throughout; no atrophy noted Sensory: Pinprick and light touch intact throughout, bilaterally Deep Tendon Reflexes: 1+ and symmetric throughout Plantars: Right:  downgoing   Left: downgoing Cerebellar: Not following commands Gait: deferred  Laboratory Studies:   Basic Metabolic Panel: No results for input(s): NA, K, CL, CO2, GLUCOSE, BUN, CREATININE, CALCIUM, MG, PHOS in the last 168 hours.  Liver Function Tests: No results for input(s): AST, ALT, ALKPHOS, BILITOT, PROT, ALBUMIN in the last 168 hours. No results for input(s): LIPASE, AMYLASE in the last 168 hours. No results for input(s): AMMONIA in the last 168 hours.  CBC: No results for input(s): WBC, NEUTROABS, HGB, HCT, MCV, PLT in the last 168 hours.  Cardiac Enzymes: No results for input(s): CKTOTAL, CKMB, CKMBINDEX, TROPONINI in the last 168 hours.  BNP: Invalid input(s): POCBNP  CBG: No results for input(s): GLUCAP in the last 168 hours.  Microbiology: Results for orders placed or performed in visit on 01/03/09  Smear     Status: None   Collection Time: 01/12/09 11:47 AM  Result Value Ref Range Status   Smear Result Smear Available  Final    Coagulation Studies: No results for input(s): LABPROT, INR in the last 72 hours.  Urinalysis:  Recent Labs Lab 11/18/15 2010  COLORURINE YELLOW  LABSPEC 1.019  PHURINE 7.0  GLUCOSEU NEGATIVE  HGBUR NEGATIVE  BILIRUBINUR NEGATIVE  KETONESUR 15*  PROTEINUR 30*  NITRITE NEGATIVE  LEUKOCYTESUR NEGATIVE    Lipid Panel:     Component Value Date/Time   CHOL 185 09/19/2015 0131   TRIG 88 09/19/2015 0131   HDL 46 09/19/2015 0131   CHOLHDL 4.0 09/19/2015 0131   VLDL 18 09/19/2015 0131   LDLCALC 121* 09/19/2015 0131    HgbA1C:  Lab Results  Component Value Date   HGBA1C 5.9* 09/19/2015    Urine Drug Screen:     Component Value Date/Time   LABOPIA NONE DETECTED 11/18/2015 2010   COCAINSCRNUR NONE DETECTED 11/18/2015 2010   LABBENZ NONE DETECTED 11/18/2015 2010   AMPHETMU NONE DETECTED 11/18/2015 2010   THCU NONE DETECTED 11/18/2015 2010   LABBARB NONE DETECTED 11/18/2015 2010    Alcohol Level: No results for  input(s): ETH in the last 168 hours.  Other results:  Imaging: Ct Head Wo Contrast  11/18/2015  CLINICAL DATA:  Confusion and combativeness. Altered mental status. Concern for stroke. EXAM: CT HEAD WITHOUT CONTRAST TECHNIQUE: Contiguous axial images were obtained from the base of the skull through the vertex without intravenous contrast. COMPARISON:  09/18/2015 FINDINGS: Moderate atrophy and advanced chronic small vessel ischemic change, similar in degree to prior exam.No intracranial hemorrhage, mass effect, or midline shift. No hydrocephalus. The basilar cisterns are patent. No evidence of territorial infarct or acute ischemia. No intracranial fluid collection. Calvarium is intact. Included paranasal sinuses are well aerated. Mild opacification of lower left mastoid air cells appears chronic. IMPRESSION: Moderate atrophy and advanced chronic small vessel ischemia. No CT findings of acute ischemia or acute process. Electronically Signed   By: Jeb Levering M.D.   On: 11/18/2015 20:53    Assessment: 82 y.o. male hx of A fib (on coumadin), HTN, HLD presenting  with sudden onset of speech deficits and weakness. History of A fib increases risk of ischemic event. With baseline dementia will need to rule out possible infectious or metabolic etiology causing change in mental status.    Plan: 1. HgbA1c, fasting lipid panel 2. MRI, MRA  of the brain without contrast 3. PT consult, OT consult, Speech consult 4. Echocardiogram 5. Carotid dopplers 6. Prophylactic therapy-continue Coumadin pending MRI results 7. Risk factor modification 8. Telemetry monitoring 9. Frequent neuro checks 10. NPO until RN stroke swallow screen     Jim Like, DO [facility] [phone]  If 7pm- 7am, please page neurology on call as listed in [place]. 11/18/2015, 10:09 PM

## 2015-11-19 ENCOUNTER — Encounter (HOSPITAL_COMMUNITY): Payer: Self-pay | Admitting: Internal Medicine

## 2015-11-19 ENCOUNTER — Other Ambulatory Visit: Payer: Medicare HMO

## 2015-11-19 ENCOUNTER — Observation Stay (HOSPITAL_COMMUNITY): Payer: Medicare HMO

## 2015-11-19 ENCOUNTER — Observation Stay (HOSPITAL_BASED_OUTPATIENT_CLINIC_OR_DEPARTMENT_OTHER): Payer: Medicare HMO

## 2015-11-19 DIAGNOSIS — G459 Transient cerebral ischemic attack, unspecified: Secondary | ICD-10-CM

## 2015-11-19 DIAGNOSIS — I10 Essential (primary) hypertension: Secondary | ICD-10-CM

## 2015-11-19 DIAGNOSIS — F0390 Unspecified dementia without behavioral disturbance: Secondary | ICD-10-CM | POA: Diagnosis not present

## 2015-11-19 DIAGNOSIS — D72829 Elevated white blood cell count, unspecified: Secondary | ICD-10-CM | POA: Diagnosis not present

## 2015-11-19 DIAGNOSIS — E785 Hyperlipidemia, unspecified: Secondary | ICD-10-CM | POA: Insufficient documentation

## 2015-11-19 DIAGNOSIS — I4891 Unspecified atrial fibrillation: Secondary | ICD-10-CM

## 2015-11-19 DIAGNOSIS — I2510 Atherosclerotic heart disease of native coronary artery without angina pectoris: Secondary | ICD-10-CM | POA: Diagnosis not present

## 2015-11-19 DIAGNOSIS — I639 Cerebral infarction, unspecified: Secondary | ICD-10-CM | POA: Diagnosis not present

## 2015-11-19 DIAGNOSIS — Z5181 Encounter for therapeutic drug level monitoring: Secondary | ICD-10-CM | POA: Diagnosis not present

## 2015-11-19 DIAGNOSIS — Z7901 Long term (current) use of anticoagulants: Secondary | ICD-10-CM

## 2015-11-19 DIAGNOSIS — G458 Other transient cerebral ischemic attacks and related syndromes: Secondary | ICD-10-CM

## 2015-11-19 DIAGNOSIS — I480 Paroxysmal atrial fibrillation: Secondary | ICD-10-CM | POA: Diagnosis not present

## 2015-11-19 DIAGNOSIS — G451 Carotid artery syndrome (hemispheric): Secondary | ICD-10-CM | POA: Diagnosis not present

## 2015-11-19 DIAGNOSIS — R69 Illness, unspecified: Secondary | ICD-10-CM | POA: Diagnosis not present

## 2015-11-19 LAB — TROPONIN I
Troponin I: <0.03 ng/mL (ref <0.031)
Troponin I: <0.03 ng/mL (ref <0.031)
Troponin I: <0.03 ng/mL (ref <0.031)
Troponin I: <0.03 ng/mL (ref <0.031)

## 2015-11-19 LAB — LIPID PANEL
CHOL/HDL RATIO: 4.9 ratio
Cholesterol: 237 mg/dL — ABNORMAL HIGH (ref 0–200)
HDL: 48 mg/dL (ref >40)
LDL CALC: 169 mg/dL — AB (ref 0–99)
Triglycerides: 99 mg/dL (ref <150)
VLDL: 20 mg/dL (ref 0–40)

## 2015-11-19 LAB — AMMONIA: AMMONIA: 27 umol/L (ref 9–35)

## 2015-11-19 LAB — PROTIME-INR
INR: 1.48 (ref 0.00–1.49)
PROTHROMBIN TIME: 18 s — AB (ref 11.6–15.2)

## 2015-11-19 MED ORDER — PANTOPRAZOLE SODIUM 40 MG PO TBEC
40.0000 mg | DELAYED_RELEASE_TABLET | Freq: Every day | ORAL | Status: DC
  Administered 2015-11-19 – 2015-11-20 (×2): 40 mg via ORAL
  Filled 2015-11-19 (×2): qty 1

## 2015-11-19 MED ORDER — ACETAMINOPHEN 650 MG RE SUPP: 650.0000 mg | RECTAL | Status: DC | PRN

## 2015-11-19 MED ORDER — ATORVASTATIN CALCIUM 40 MG PO TABS
40.0000 mg | ORAL_TABLET | Freq: Every day | ORAL | Status: DC
  Administered 2015-11-19: 40 mg via ORAL
  Filled 2015-11-19: qty 1

## 2015-11-19 MED ORDER — ASPIRIN 81 MG PO CHEW
81.0000 mg | CHEWABLE_TABLET | Freq: Every day | ORAL | Status: DC
  Administered 2015-11-19 – 2015-11-20 (×2): 81 mg via ORAL
  Filled 2015-11-19 (×2): qty 1

## 2015-11-19 MED ORDER — DONEPEZIL HCL 5 MG PO TABS: 5.0000 mg | ORAL_TABLET | Freq: Every day | ORAL | Status: DC

## 2015-11-19 MED ORDER — STROKE: EARLY STAGES OF RECOVERY BOOK
Freq: Once | Status: DC
  Filled 2015-11-19: qty 1

## 2015-11-19 MED ORDER — METOPROLOL TARTRATE 25 MG PO TABS: 25.0000 mg | ORAL_TABLET | Freq: Two times a day (BID) | ORAL | Status: DC

## 2015-11-19 MED ORDER — WARFARIN - PHARMACIST DOSING INPATIENT
Freq: Every day | Status: DC
  Administered 2015-11-19: 18:00:00

## 2015-11-19 MED ORDER — DONEPEZIL HCL 5 MG PO TABS
5.0000 mg | ORAL_TABLET | Freq: Every day | ORAL | Status: DC
  Administered 2015-11-19: 5 mg via ORAL
  Filled 2015-11-19: qty 1

## 2015-11-19 MED ORDER — WARFARIN SODIUM 3 MG PO TABS
3.0000 mg | ORAL_TABLET | Freq: Once | ORAL | Status: AC
Start: 2015-11-19 — End: 2015-11-19
  Administered 2015-11-19: 3 mg via ORAL
  Filled 2015-11-19: qty 1

## 2015-11-19 MED ORDER — ESCITALOPRAM OXALATE 10 MG PO TABS
10.0000 mg | ORAL_TABLET | Freq: Every day | ORAL | Status: DC
  Administered 2015-11-19 – 2015-11-20 (×2): 10 mg via ORAL
  Filled 2015-11-19 (×2): qty 1

## 2015-11-19 MED ORDER — ACETAMINOPHEN 325 MG PO TABS: 650.0000 mg | ORAL_TABLET | ORAL | Status: DC | PRN

## 2015-11-19 MED ORDER — POLYVINYL ALCOHOL 1.4 % OP SOLN: 1.0000 [drp] | Freq: Four times a day (QID) | OPHTHALMIC | Status: DC | PRN

## 2015-11-19 MED ORDER — METOPROLOL TARTRATE 25 MG PO TABS
25.0000 mg | ORAL_TABLET | Freq: Two times a day (BID) | ORAL | Status: DC
  Administered 2015-11-19 – 2015-11-20 (×3): 25 mg via ORAL
  Filled 2015-11-19 (×3): qty 1

## 2015-11-19 NOTE — Evaluation (Signed)
Clinical/Bedside Swallow Evaluation Patient Details  Name: Samuel Santiago MRN: [id number] Date of Birth: Dec 24, 1928  Today's Date: 11/19/2015 Time: SLP Start Time (ACUTE ONLY): 1002 SLP Stop Time (ACUTE ONLY): 1016 SLP Time Calculation (min) (ACUTE ONLY): 14 min  Past Medical History:  Past Medical History  Diagnosis Date  . Hyperlipidemia   . Colon cancer ([place]) 1990    Had recurrence in 2003 resultinbg in chemotherapy. He recently had a colonoscopy and  had a polyp removed that was benign.  . Hypertension   . Ischemic heart disease     has had remote MI in 1997 and was treated with TPA. Prior PCI to RCA with repeat PCI to the RCA in 2003  . Kidney stone     x11  . Normal nuclear stress test Feb 2012    No significant ischemia. EF 56%; with basal inferior scar  . History of nephrolithiasis 01/23/2012  . Arthritis   . History of colon polyps   . GERD (gastroesophageal reflux disease)   . PAF (paroxysmal atrial fibrillation) ([place])   . Chronic anticoagulation   . Coronary artery disease   . Myocardial infarction ([place])   . Dysrhythmia   . CHF (congestive heart failure) ([place])   . Dementia    Past Surgical History:  Past Surgical History  Procedure Laterality Date  . Colonoscopy      colon cancer  . Coronary stent placement  1997 & 2003    PCI to RCA x 2  . Colon surgery      x 2 for cancer  . Eye surgery     HPI:  82 y.o. male hx of HTN, A fib (on coumadin), HLD, dementia brought in by family with increased confusion, slow responses, slurred speech and generalized weakness. Limited MRI without acute infarct. CXR without acute infection.   Assessment / Plan / Recommendation Clinical Impression  Pt's swallow appears to be within gross functional limits, although only very soft solids were administered as his dentures were not available and pt reports that he does not eat without them. With soft textures, he had appropriate bolus preparation and clearance. Son at bedside  believes that family can bring the pt's dentures in by lunch time. Therefore, will place order for mechanical soft diet and thin liquids.    Aspiration Risk  Mild aspiration risk    Diet Recommendation Dysphagia 3 (Mech soft);Thin liquid   Liquid Administration via: Cup;Straw Medication Administration: Whole meds with liquid Supervision: Patient able to self feed;Intermittent supervision to cue for compensatory strategies Compensations: Minimize environmental distractions;Slow rate;Small sips/bites Postural Changes: Seated upright at 90 degrees    Other  Recommendations Oral Care Recommendations: Oral care BID   Follow up Recommendations  24 hour supervision/assistance    Frequency and Duration            Prognosis        Swallow Study   General HPI: 82 y.o. male hx of HTN, A fib (on coumadin), HLD, dementia brought in by family with increased confusion, slow responses, slurred speech and generalized weakness. Limited MRI without acute infarct. CXR without acute infection. Type of Study: Bedside Swallow Evaluation Previous Swallow Assessment: none in chart Diet Prior to this Study: NPO Temperature Spikes Noted: No Respiratory Status: Room air History of Recent Intubation: No Behavior/Cognition: Alert;Cooperative;Pleasant mood;Requires cueing Oral Cavity Assessment: Dry Oral Care Completed by SLP: No Oral Cavity - Dentition: Edentulous;Dentures, not available (pt does not eat without his dentures) Vision: Functional for self-feeding Self-Feeding  Abilities: Able to feed self Patient Positioning: Upright in bed Baseline Vocal Quality: Normal Volitional Cough: Strong Volitional Swallow: Able to elicit    Oral/Motor/Sensory Function Overall Oral Motor/Sensory Function: Within functional limits   Ice Chips Ice chips: Not tested   Thin Liquid Thin Liquid: Within functional limits Presentation: Cup;Self Fed;Straw    Nectar Thick Nectar Thick Liquid: Not tested   Honey Thick  Honey Thick Liquid: Not tested   Puree Puree: Within functional limits Presentation: Spoon;Self Fed   Solid   GO   Solid: Within functional limits (given that dentures are not available) Presentation: Self Fed;Spoon    Functional Assessment Tool Used: skilled clinical judgment Functional Limitations: Swallowing Swallow Current Status [id number] At least 1 percent but less than 20 percent impaired, limited or restricted Swallow Goal Status [phone]): At least 1 percent but less than 20 percent impaired, limited or restricted Swallow Discharge Status [phone]): At least 1 percent but less than 20 percent impaired, limited or restricted   Samuel Santiago, M.A. CCC-SLP [phone]  Samuel Santiago 11/19/2015,10:26 AM

## 2015-11-19 NOTE — Progress Notes (Signed)
ANTICOAGULATION CONSULT NOTE - Initial Consult  Pharmacy Consult for Coumadin Indication: atrial fibrillation  Allergies  Allergen Reactions  . Tape Other (See Comments)    Pulled off skin one time--- "it really sticks to me" but will probably tolerate paper tape     Vital Signs: BP: 120/98 mmHg (01/29 2300) Pulse Rate: 57 (01/29 2300)  Labs:  Recent Labs  11/18/15 2223  HGB 16.1  HCT 45.1  PLT 147*  APTT 39*  LABPROT 18.1*  INR 1.49  CREATININE 1.04  TROPONINI <0.03    Estimated Creatinine Clearance: 53.2 mL/min (by C-G formula based on Cr of 1.04).   Medical History: Past Medical History  Diagnosis Date  . Hyperlipidemia   . Colon cancer ([place]) 1990    Had recurrence in 2003 resultinbg in chemotherapy. He recently had a colonoscopy and  had a polyp removed that was benign.  . Hypertension   . Ischemic heart disease     has had remote MI in 1997 and was treated with TPA. Prior PCI to RCA with repeat PCI to the RCA in 2003  . Kidney stone     x11  . Normal nuclear stress test Feb 2012    No significant ischemia. EF 56%; with basal inferior scar  . History of nephrolithiasis 01/23/2012  . Arthritis   . History of colon polyps   . GERD (gastroesophageal reflux disease)   . PAF (paroxysmal atrial fibrillation) ([place])   . Chronic anticoagulation   . Coronary artery disease   . Myocardial infarction ([place])   . Dysrhythmia   . CHF (congestive heart failure) ([place])   . Dementia      Assessment: 82yo male w/ recent dx of Afib and started on Coumadin presents w/ sudden onset of speech deficits and weakness, CT negative for acute process, MRI limited 2/2 motion but no signs of acute infarct, etiology of current illness unclear, to continue Coumadin for now.  Current INR below goal w/ last dose taken 1/29 prior to presentation.  Goal of Therapy:  INR 2-3   Plan:  Will give boosted Coumadin dose of 3mg  po x1 and monitor INR for dose adjustments.  Wynona Neat,  PharmD, BCPS  11/19/2015,12:44 AM

## 2015-11-19 NOTE — Care Management Note (Signed)
Case Management Note  Patient Details  Name: Samuel Santiago MRN: [id number] Date of Birth: 03-12-1929  Subjective/Objective:                    Action/Plan: Plan is to change patients medication from Coumadin to Eliquis. CM did a benefits check on Eliquis with a result of patient not meeting his deductible yet this year. Eliquis will be $295 for the second month (after first 30 days free) and then $20/ month there after. CM met with the patient and his daughter and went over this information. The daughter expressed concern that even after he gets to the $20/ month he often gets into the doughnut hole at the end of the year and may not be able to afford the Eliquis then. CM encourage the daughter to take the card and call the number for Eliquis and see if the patient qualifies for any additional assistance. Daughter in agreement and will follow up with the [facility] and inform CM of her finding and decision on Eliquis tomorrow am. Dr Sloan Leiter informed of above.   Expected Discharge Date:                  Expected Discharge Plan:     In-House Referral:     Discharge planning Services     Post Acute Care Choice:    Choice offered to:     DME Arranged:    DME Agency:     HH Arranged:    [place] Agency:     Status of Service:     Medicare Important Message Given:    Date Medicare IM Given:    Medicare IM give by:    Date Additional Medicare IM Given:    Additional Medicare Important Message give by:     If discussed at [place] of Stay Meetings, dates discussed:    Additional Comments:  [name], RN 11/19/2015, 3:02 PM

## 2015-11-19 NOTE — [place] Notes (Signed)
STROKE TEAM PROGRESS NOTE   HISTORY OF PRESENT ILLNESS Samuel Santiago is an 82 y.o. male hx of HTN, A fib (on coumadin), HLD, ? dementia brought in by family with increased confusion, slow responses, slurred speech and generalized weakness. Family reports he was normal at noon today, around 1700 (LKW 11/18/2015 12 o'clock) noted to have a headache, seeme lethargic. Took a nap, woke up and fell due to weakness. Family unclear if generalized or unilateral weakness. They note that he seemed to not understand them, had trouble getting his words out with noted dysarthria. No prior CVA or TIA. Recently diagnosed with A fib and started on coumadin. CT head imaging reviewed, shows no acute process. Modified Rankin: Rankin Score=2.  Patient was not administered TPA secondary to outside tPA window, on coumadin. He was admitted for further evaluation and treatment.   SUBJECTIVE (INTERVAL HISTORY) His son is at the bedside. When asked why he came to the hospital, he said "because my wife told me to come". He then asked his son why he was here. He was confused at the time - could not recognize own children nor name them. Son feels he is back to his baseline today.   OBJECTIVE Temp:  [97.9 F (36.6 C)-98.7 F (37.1 C)] 98.3 F (36.8 C) (01/30 1000) Pulse Rate:  [55-74] 55 (01/30 1000) Cardiac Rhythm:  [-] Normal sinus rhythm (01/30 0700) Resp:  [15-21] 20 (01/30 1000) BP: (110-183)/(72-98) 110/82 mmHg (01/30 1000) SpO2:  [94 %-100 %] 97 % (01/30 1000) Weight:  [82.01 kg (180 lb 12.8 oz)] 82.01 kg (180 lb 12.8 oz) (01/30 0214)  CBC:   Recent Labs Lab 11/18/15 2223  WBC 13.9*  NEUTROABS 12.2*  HGB 16.1  HCT 45.1  MCV 98.5  PLT 147*    Basic Metabolic Panel:   Recent Labs Lab 11/18/15 2223  NA 139  K 3.8  CL 101  CO2 26  GLUCOSE 160*  BUN 25*  CREATININE 1.04  CALCIUM 10.0    Lipid Panel:     Component Value Date/Time   CHOL 237* 11/19/2015 0303   TRIG 99 11/19/2015 0303   HDL  48 11/19/2015 0303   CHOLHDL 4.9 11/19/2015 0303   VLDL 20 11/19/2015 0303   LDLCALC 169* 11/19/2015 0303   HgbA1c:  Lab Results  Component Value Date   HGBA1C 5.9* 09/19/2015   Urine Drug Screen:     Component Value Date/Time   LABOPIA NONE DETECTED 11/18/2015 2010   COCAINSCRNUR NONE DETECTED 11/18/2015 2010   LABBENZ NONE DETECTED 11/18/2015 2010   AMPHETMU NONE DETECTED 11/18/2015 2010   THCU NONE DETECTED 11/18/2015 2010   LABBARB NONE DETECTED 11/18/2015 2010      IMAGING I have personally reviewed the radiological images below and agree with the radiology interpretations.  Dg Chest 2 View 11/19/2015   Negative low volume chest.   Ct Head Wo Contrast 11/18/2015   Moderate atrophy and advanced chronic small vessel ischemia. No CT findings of acute ischemia or acute process.   Mr Brain Wo Contrast 11/19/2015   1. Markedly limited study due to motion artifact and patient's inability to tolerate full length of the exam. 2. No definite evidence for acute intracranial infarct or other abnormality. 3. Advanced cerebral atrophy with chronic small vessel ischemic disease.   Carotid Doppler   There is 1-39% bilateral ICA stenosis. Vertebral artery flow is antegrade.    2D echo - 09/19/15 - Left ventricle: The cavity size was normal. Wall thickness was  normal. Systolic function was normal. The estimated ejection fraction was in the range of 50% to 55%. Wall motion was normal; there were no regional wall motion abnormalities. Doppler parameters are consistent with abnormal left ventricular relaxation (grade 1 diastolic dysfunction).   PHYSICAL EXAM  Temp:  [97.9 F (36.6 C)-98.7 F (37.1 C)] 98.1 F (36.7 C) (01/30 1428) Pulse Rate:  [55-74] 56 (01/30 1428) Resp:  [15-21] 18 (01/30 1428) BP: (110-183)/(67-98) 157/67 mmHg (01/30 1428) SpO2:  [94 %-100 %] 97 % (01/30 1428) Weight:  [180 lb 12.8 oz (82.01 kg)] 180 lb 12.8 oz (82.01 kg) (01/30 0214)  General -  Well nourished, well developed, in no apparent distress, lying in bed.  Ophthalmologic - Fundi not visualized due to noncooperation.  Cardiovascular - Regular rate and rhythm, not in afib.  Mental Status -  Level of arousal and orientation to time, place, and person were intact. Language including expression, naming, repetition, comprehension was assessed and found intact. Fund of Knowledge was assessed and was intact.  Cranial Nerves II - XII - II - Visual field intact OU. III, IV, VI - Extraocular movements intact. V - Facial sensation intact bilaterally. VII - Facial movement intact bilaterally, poor denture. VIII - Hearing & vestibular intact bilaterally. X - Palate elevates symmetrically. XI - Chin turning & shoulder shrug intact bilaterally. XII - Tongue protrusion intact.  Motor Strength - The patient's strength was 4/5 in all extremities and pronator drift was absent.  Bulk was normal and fasciculations were absent.   Motor Tone - Muscle tone was assessed at the neck and appendages and was normal.  Reflexes - The patient's reflexes were 1+ in all extremities and he had no pathological reflexes.  Sensory - Light touch, temperature/pinprick were assessed and were symmetrical.    Coordination - The patient had normal movements in the hands and feet with no ataxia or dysmetria.  Tremor was absent.  Gait and Station - deferred due to safety concerns.   ASSESSMENT/PLAN [name] is a 82 y.o. male with history of  HTN, A fib (on coumadin), HLD, CAD and MI s/p tPA and PCI, dementia presenting with increased confusion, slow responses, slurred speech and generalized weakness. He did not receive IV t-PA due to being on coumadin.  Encephalopathy vs afib with subtherapeutic INR  Resultant deficit resolution  MRI  No acute stroke  Carotid Doppler  No significant stenosis   2D Echo EF 50-55% in 08/2015  LDL 169  HgbA1c 5.9 in Dec  Warfarin for VTE  prophylaxis DIET DYS 3 Room service appropriate?: Yes; Fluid consistency:: Thin  warfarin daily prior to admission, now on aspirin 81 mg daily and warfarin daily. Continue ASA and coumadin for stroke and cardiac prevention. INR goal 2-3.  Ongoing aggressive stroke risk factor management  Therapy recommendations:  pending   Disposition:  pending   Disposition per primary  No neuro follow up necessary  Stroke team will signoff  Paroxysmal Atrial Fibrillation  Home anticoagulation:  warfarin continued in the hospital  INR 1.49 on admission  Pharmacy adjusting dose  Continue warfarin at discharge   Hypertension  Elevated 150-170s  On metoprolol  BP goal normotensive  Hyperlipidemia  Home meds:  Fish oil, no statin  LDL 169  Add lipitor 40 mg daily  Continue statin at discharge  Other Stroke Risk Factors  Advanced age  Coronary artery disease - MI 41, angioplasty 2003 - continue ASA  Other Active Problems  Baseline dementia, on aricept  History of colon cancer 1990 with recurrence 2003, recent polyp bx benign  Hospital day # 1  Neurology will sign off. Please call with questions. No neuro follow up needed at this time. Thanks for the consult.  Rosalin Hawking, MD PhD Stroke Neurology 11/19/2015 6:34 PM   To contact Stroke Continuity provider, please refer to [email or web address] After hours, contact General Neurology

## 2015-11-19 NOTE — Evaluation (Signed)
Physical Therapy Evaluation Patient Details Name: Samuel Santiago MRN: [id number] DOB: 09-14-1929 Today's Date: 11/19/2015   History of Present Illness  Came to ED with slurred speech confusion, and fall. MRI brain negative for acute injury. PMHx-recently diagnosed afib (subtherapeutic anticoagulation on admission), dementia, HTN    Clinical Impression  Pt admitted with above diagnosis. Pt currently with functional limitations due to the deficits listed below (see PT Problem List). Patient has had progressive weakness and multiple falls at home. He expresses understanding of goals of PT, however will NOT consider SNF prior to home (not eligible for CIR due to observation status). Pt will benefit from skilled PT to increase their independence and safety with mobility to allow discharge to the venue listed below.       Follow Up Recommendations SNF;Supervision for mobility/OOB (pt refusing SNF, would then recommend HHPT)    Equipment Recommendations  None recommended by PT    Recommendations for Other Services       Precautions / Restrictions Precautions Precautions: Fall Precaution Comments: reports numerous falls over past year; "I just get offbalance"      Mobility  Bed Mobility Overal bed mobility: Needs Assistance Bed Mobility: Supine to Sit     Supine to sit: Mod assist;HOB elevated     General bed mobility comments: with rail;   Transfers Overall transfer level: Needs assistance Equipment used: Rolling walker (2 wheeled) Transfers: Sit to/from Stand Sit to Stand: Min assist         General transfer comment: posterior lean requires min assist for balance  Ambulation/Gait Ambulation/Gait assistance: Min assist Ambulation Distance (Feet): 90 Feet Assistive device: Rolling walker (2 wheeled) Gait Pattern/deviations: Step-through pattern;Decreased stride length;Drifts right/left     General Gait Details: very slow, ran into objects on left or right when in  tight spaces; pushes RW too far ahead  Stairs            Wheelchair Mobility    Modified Rankin (Stroke Patients Only)       Balance Overall balance assessment: Needs assistance;History of Falls Sitting-balance support: No upper extremity supported;Feet supported Sitting balance-Leahy Scale: Poor   Postural control: Posterior lean Standing balance support: No upper extremity supported Standing balance-Leahy Scale: Fair Standing balance comment: unsteady as comes to stand; once standing can maintain statically                             Pertinent Vitals/Pain Pain Assessment: No/denies pain    Home Living Family/patient expects to be discharged to:: Private residence Living Arrangements: Spouse/significant other Available Help at Discharge: Family;Available 24 hours/day (wife can only provide supervision) Type of Home: House Home Access: Stairs to enter Entrance Stairs-Rails: Right Entrance Stairs-Number of Steps: 3 Home Layout: One level Home Equipment: Walker - 2 wheels;Walker - 4 wheels;Tub bench;Shower seat;Cane - single point Additional Comments: Daughter present to confirm    Prior Function Level of Independence: Independent with assistive device(s)         Comments: Pt reports furniture walking at home and bumping into things. Does not like to use cane or walker inside. Uses cane outside home     Hand Dominance        Extremity/Trunk Assessment   Upper Extremity Assessment: Generalized weakness;Defer to OT evaluation           Lower Extremity Assessment: Generalized weakness (quads 4/5 bil)      Cervical / Trunk Assessment: Normal  Communication  Communication: HOH  Cognition Arousal/Alertness: Awake/alert Behavior During Therapy: WFL for tasks assessed/performed Overall Cognitive Status: History of cognitive impairments - at baseline       Memory: Decreased short-term memory              General Comments General  comments (skin integrity, edema, etc.): Lengthy discussion re: incr risk of falls and risk of severe injury. Discussed goals of PT. Pt agreeable to HHPT however will NOT consider SNF    Exercises        Assessment/Plan    PT Assessment Patient needs continued PT services  PT Diagnosis Difficulty walking;Generalized weakness   PT Problem List Decreased strength;Decreased balance;Decreased mobility;Decreased cognition;Decreased knowledge of use of DME;Decreased safety awareness;Decreased knowledge of precautions;Obesity  PT Treatment Interventions DME instruction;Gait training;Stair training;Functional mobility training;Therapeutic activities;Therapeutic exercise;Balance training;Cognitive remediation;Patient/family education   PT Goals (Current goals can be found in the Care Plan section) Acute Rehab PT Goals Patient Stated Goal: I'm going home! PT Goal Formulation: With patient Time For Goal Achievement: 11/26/15 Potential to Achieve Goals: Good    Frequency Min 3X/week   Barriers to discharge Decreased caregiver support wife can only provide supervision    Co-evaluation               End of Session Equipment Utilized During Treatment: Gait belt Activity Tolerance: Patient tolerated treatment well Patient left: in chair;with call bell/phone within reach;with chair alarm set;with family/visitor present      Functional Assessment Tool Used: clinical judgement Functional Limitation: Mobility: Walking and moving around Mobility: Walking and Moving Around Current Status [id number]): At least 20 percent but less than 40 percent impaired, limited or restricted Mobility: Walking and Moving Around Goal Status [phone]): At least 1 percent but less than 20 percent impaired, limited or restricted    Time: 1435-1519 PT Time Calculation (min) (ACUTE ONLY): 44 min   Charges:   PT Evaluation $PT Eval Moderate Complexity: 1 Procedure PT Treatments $Gait Training: 8-22 mins $Self  Care/Home Management: 8-22   PT G Codes:   PT G-Codes **NOT FOR INPATIENT CLASS** Functional Assessment Tool Used: clinical judgement Functional Limitation: Mobility: Walking and moving around Mobility: Walking and Moving Around Current Status [id number]): At least 20 percent but less than 40 percent impaired, limited or restricted Mobility: Walking and Moving Around Goal Status [phone]): At least 1 percent but less than 20 percent impaired, limited or restricted    Kezia Benevides 11/19/2015, 3:36 PM Pager [phone]

## 2015-11-19 NOTE — Progress Notes (Signed)
[facility] PROGRESS NOTE  Samuel Santiago [facility] [id number] DOB: March 24, 1929 DOA: 11/18/2015 PCP: Walker Kehr, MD  HPI/Subjective: The patient is alert and cooperative, oriented to person, he answers most questions appropriately and speech is clear. He denies chest pain, dyspnea, or headache.   Assessment/Plan: ?TIA: Per wife report the patient has had some generalized weakness and speech difficulty for the past year but yesterday he was not at baseline. MRI limited but no definite acute process noted. Tox screen negative. Given the patient's Afib and subtherapeutic INR on admission suspect his represents a TIA. Lipid panel reviewed with LDL 169, will start on a statin for goal <70. A1c pending.Echo and Carotid Doppler pending. Continue coumadin per pharmacy-patient unable to afford NOAC's in the past  Paroxsymal Atrial Fibrillation: Subtherapeutic on warfarin. It appears his INR levels fluctuate (5.0 on Jan 3rd) I have discussed the possibility of alternative anticoagulant options with the wife. She reports they had looked into these options in the past but it did not work out due to cost. Case management will look into options for the patient. Currently rate controlled on metoprolol.  Mild leukocytosis, UA not consistent with infection, CXR negative. No fevers or other evidence of infectious process.   CAD with past PCI (2003): Echo 08/2015 showed EF 50-55%. Denies any chest pain or dyspnea. Cardiac markers negative. Continue metoprolol, statin and aspirin.  HTN: Holding home losartan and HCTZ, permissive hypertension <220/120  GERD, no complaints, continue Omeprazole  Dementia without behavioral disturbance: the patient lives with his wife, who assists him with many Santiago activities. Continue Ariecpt.  Code Status: FULL Family Communication: Discussed with wife [name] (over phone), and son at bedside. Disposition Plan: Pending further workup, PT recommendations DVT PPX: currently  subtherapeutic on warfarin  Consultants:  Neurology  Procedures:  Echo  Antibiotics:  None    Objective: Filed Vitals:   11/19/15 0800 11/19/15 1000  BP: 176/91 110/82  Pulse: 59 55  Temp:  98.3 F (36.8 C)  Resp: 20 20    Intake/Output Summary (Last 24 hours) at 11/19/15 1101 Last data filed at 11/19/15 0230  Gross per 24 hour  Intake      0 ml  Output      0 ml  Net      0 ml   Filed Weights   11/19/15 0214  Weight: 82.01 kg (180 lb 12.8 oz)    Exam:   General:  Alert, NAD, answers most questions appropriately  Cardiovascular: RRR, no m/r/g  Respiratory: CTAB, no wheezing   Abdomen: soft, non-tender, nondistended, no guarding  Musculoskeletal: no peripheral edema  Neuro: speech clear, PERRL, strength BUE 5/5, BLE 5/5  Data Reviewed: Basic Metabolic Panel:  Recent Labs Lab 11/18/15 2223  NA 139  K 3.8  CL 101  CO2 26  GLUCOSE 160*  BUN 25*  CREATININE 1.04  CALCIUM 10.0   Liver Function Tests:  Recent Labs Lab 11/18/15 2223  AST 25  ALT 25  ALKPHOS 67  BILITOT 1.6*  PROT 6.6  ALBUMIN 3.5   No results for input(s): LIPASE, AMYLASE in the last 168 hours.  Recent Labs Lab 11/19/15 0807  AMMONIA 27   CBC:  Recent Labs Lab 11/18/15 2223  WBC 13.9*  NEUTROABS 12.2*  HGB 16.1  HCT 45.1  MCV 98.5  PLT 147*   Cardiac Enzymes:  Recent Labs Lab 11/18/15 2223 11/19/15 0303 11/19/15 0807  TROPONINI <0.03 <0.03 <0.03   BNP (last 3 results) No results for input(s): BNP  in the last 8760 hours.  ProBNP (last 3 results) No results for input(s): PROBNP in the last 8760 hours.  CBG: No results for input(s): GLUCAP in the last 168 hours.  No results found for this or any previous visit (from the past 240 hour(s)).   Studies: Dg Chest 2 View  11/19/2015  CLINICAL DATA:  Leukocytosis.  CVA EXAM: CHEST  2 VIEW COMPARISON:  09/18/2015 FINDINGS: Stable borderline cardiomegaly, with size likely accentuated by fat pad.  Interstitial crowding. There is no edema, consolidation, effusion, or pneumothorax. No acute osseous findings. IMPRESSION: Negative low volume chest. Electronically Signed   By: Monte Fantasia M.D.   On: 11/19/2015 02:33   Ct Head Wo Contrast  11/18/2015  CLINICAL DATA:  Confusion and combativeness. Altered mental status. Concern for stroke. EXAM: CT HEAD WITHOUT CONTRAST TECHNIQUE: Contiguous axial images were obtained from the base of the skull through the vertex without intravenous contrast. COMPARISON:  09/18/2015 FINDINGS: Moderate atrophy and advanced chronic small vessel ischemic change, similar in degree to prior exam.No intracranial hemorrhage, mass effect, or midline shift. No hydrocephalus. The basilar cisterns are patent. No evidence of territorial infarct or acute ischemia. No intracranial fluid collection. Calvarium is intact. Included paranasal sinuses are well aerated. Mild opacification of lower left mastoid air cells appears chronic. IMPRESSION: Moderate atrophy and advanced chronic small vessel ischemia. No CT findings of acute ischemia or acute process. Electronically Signed   By: Jeb Levering M.D.   On: 11/18/2015 20:53   Mr Brain Wo Contrast  11/19/2015  CLINICAL DATA:  Initial evaluation for receptive aphasia. EXAM: MRI HEAD WITHOUT CONTRAST TECHNIQUE: Multiplanar, multiecho pulse sequences of the brain and surrounding structures were obtained without intravenous contrast. COMPARISON:  Prior CT from earlier the same day. FINDINGS: Study is limited by motion artifact and patient's inability to tolerate the full length of the exam. Diffuse prominence of the CSF containing spaces is compatible with generalized cerebral atrophy. Confluent T2/FLAIR hyperintensity within the periventricular white matter most compatible with chronic small vessel ischemic disease, moderate to advanced in nature. Small vessel type changes present within the pons. No midline shift or mass effect. No definite  mass lesion, although evaluation fairly limited. No definite extra-axial fluid collection. Ventricular prominence related to global parenchymal volume loss without hydrocephalus. Diffusion-weighted imaging demonstrates no convincing evidence for acute infarct. Minimal high signal intensity within the right aspect of the midbrain on axial DWI sequence image 21 favored to be artifactual in nature. Major intracranial vascular flow voids maintained. Probable dolichoectasia noted. No definite acute abnormality about the orbits. Sequela prior bilateral lens extraction. Paranasal sinuses are grossly clear. Probable small left mastoid effusion. IMPRESSION: 1. Markedly limited study due to motion artifact and patient's inability to tolerate full length of the exam. 2. No definite evidence for acute intracranial infarct or other abnormality. 3. Advanced cerebral atrophy with chronic small vessel ischemic disease. Electronically Signed   By: Jeannine Boga M.D.   On: 11/19/2015 00:36    Scheduled Meds: .  stroke: mapping our early stages of recovery book   Does not apply Once  . aspirin  81 mg Oral Santiago  . donepezil  5 mg Oral QHS  . escitalopram  10 mg Oral Santiago  . metoprolol tartrate  25 mg Oral BID  . pantoprazole  40 mg Oral Santiago  . warfarin  3 mg Oral ONCE-1800  . Warfarin - Pharmacist Dosing Inpatient   Does not apply q1800   Continuous Infusions: . sodium chloride  100 mL/hr (11/19/15 0230)    Active Problems:   Essential hypertension   Coronary atherosclerosis   Atrial fibrillation (HCC)   Anticoagulation management encounter   Receptive aphasia   Dementia   TIA (transient ischemic attack)    Time spent: 25 min  S Weronika Birch MD [facility]  If 7PM-7AM, please contact night-coverage at [email or web address], password [facility] 11/19/2015, 11:01 AM  LOS: 1 day

## 2015-11-19 NOTE — Progress Notes (Signed)
VASCULAR LAB PRELIMINARY  PRELIMINARY  PRELIMINARY  PRELIMINARY  Carotid duplex  completed.    Preliminary report:  Bilateral:  1-39% ICA stenosis.  Vertebral artery flow is antegrade.      Khyree Carillo, RVT 11/19/2015, 11:33 AM

## 2015-11-20 DIAGNOSIS — E785 Hyperlipidemia, unspecified: Secondary | ICD-10-CM | POA: Diagnosis not present

## 2015-11-20 DIAGNOSIS — I480 Paroxysmal atrial fibrillation: Secondary | ICD-10-CM | POA: Diagnosis not present

## 2015-11-20 DIAGNOSIS — I10 Essential (primary) hypertension: Secondary | ICD-10-CM | POA: Diagnosis not present

## 2015-11-20 DIAGNOSIS — G451 Carotid artery syndrome (hemispheric): Secondary | ICD-10-CM | POA: Diagnosis not present

## 2015-11-20 DIAGNOSIS — I2510 Atherosclerotic heart disease of native coronary artery without angina pectoris: Secondary | ICD-10-CM | POA: Diagnosis not present

## 2015-11-20 DIAGNOSIS — G459 Transient cerebral ischemic attack, unspecified: Secondary | ICD-10-CM | POA: Diagnosis not present

## 2015-11-20 LAB — TROPONIN I: Troponin I: <0.03 ng/mL (ref <0.031)

## 2015-11-20 LAB — HEMOGLOBIN A1C
Hgb A1c MFr Bld: 5.8 % — ABNORMAL HIGH (ref 4.8–5.6)
MEAN PLASMA GLUCOSE: 120 mg/dL

## 2015-11-20 LAB — PROTIME-INR
INR: 1.72 — AB (ref 0.00–1.49)
Prothrombin Time: 20.2 seconds — ABNORMAL HIGH (ref 11.6–15.2)

## 2015-11-20 MED ORDER — WARFARIN SODIUM 2.5 MG PO TABS: ORAL_TABLET | ORAL | Status: DC

## 2015-11-20 MED ORDER — ATORVASTATIN CALCIUM 40 MG PO TABS: 40.0000 mg | ORAL_TABLET | Freq: Every day | ORAL | Status: DC

## 2015-11-20 MED ORDER — ASPIRIN 81 MG PO CHEW: 81.0000 mg | CHEWABLE_TABLET | Freq: Every day | ORAL | Status: AC

## 2015-11-20 NOTE — Care Management Note (Signed)
Case Management Note  Patient Details  Name: BEKIM WERNTZ MRN: [id number] Date of Birth: October 01, 1929  Subjective/Objective:                    Action/Plan: Plan is for patient to discharge home with home health services. CM met with the patient and his wife and provided them a list of agencies in the [facility] area. They selected [facility]. Manuela Schwartz with [facility] notified and accepted the referral. The patient will have a $40 co pay per PT/OT visit per Manuela Schwartz. Ms Callaham made aware and would like to continue with home health services. Patient and family decided that Eliquis would be too expensive for the patient and would like to continue with Coumadin therapy. Dr Sloan Leiter asked that a coumadin clinic visit be scheduled. CM arranged for the patient to have a coumadin appointment on Thursday Feb. 2nd. Appointment given to Ms [facility] and placed on the AVS. Will update the bedside RN.   Expected Discharge Date:                  Expected Discharge Plan:  [place]  In-House Referral:     Discharge planning Services  CM Consult  Post Acute Care Choice:  Home Health Choice offered to:  Spouse  DME Arranged:    DME Agency:     HH Arranged:  PT, OT, Nurse's Aide, Social Work [employer] Agency:  [place]  Status of Service:  Completed, signed off  Medicare Important Message Given:    Date Medicare IM Given:    Medicare IM give by:    Date Additional Medicare IM Given:    Additional Medicare Important Message give by:     If discussed at [place] of Stay Meetings, dates discussed:    Additional Comments:  [name], RN 11/20/2015, 11:57 AM

## 2015-11-20 NOTE — Clinical Social Work Note (Signed)
Clinical Social Worker notified by [facility] that patient will return home with Home Health services.   Clinical Social Worker will sign off for now as social work intervention is no longer needed. Please consult [place] again if new need arises.  Glendon Axe, MSW, LCSWA [phone] 11/20/2015 11:33 AM

## 2015-11-20 NOTE — Care Management Obs Status (Signed)
[facility] NOTIFICATION   Patient Details  Name: Samuel Santiago MRN: [id number] Date of Birth: 08/04/1929   Medicare Observation Status Notification Given:  Yes    [name], RN,CM [phone] 11/20/2015, 10:33 AM

## 2015-11-20 NOTE — Progress Notes (Signed)
Physical Therapy Treatment Patient Details Name: Samuel Santiago MRN: [id number] DOB: 01/28/1929 Today's Date: 11/20/2015    History of Present Illness Came to ED with slurred speech confusion, and fall. MRI brain negative for acute injury. PMHx-recently diagnosed afib (subtherapeutic anticoagulation on admission), dementia, HTN    PT Comments    Wife present throughout. She noted "he listens to you a lot better than he will listen to me." Educated both on proper use of RW (after pt attempted use of cane and admitted he was more safe with the RW). Wife reports 2 sturdy rails that are close enough together that pt holds onto both when going up/down steps at entrance to their home. She did not feel he would have any difficulty getting up steps. Reviewed PT recommendation for SNF and incr therapy, however pt continues to refuse. Pt is agreeable to HHPT.   Follow Up Recommendations  SNF;Supervision/Assistance - 24 hour (pt refusing SNF, would then recommend HHPT)     Equipment Recommendations  None recommended by PT    Recommendations for Other Services       Precautions / Restrictions Precautions Precautions: Fall Precaution Comments: wife reports he has only fallen this one time this year    Mobility  Bed Mobility Overal bed mobility: Needs Assistance Bed Mobility: Rolling;Sidelying to Sit Rolling: Modified independent (Device/Increase time) Sidelying to sit: Min guard       General bed mobility comments: with heavy use of rail; near need for assist to gain his balance EOB   Transfers Overall transfer level: Needs assistance Equipment used: Rolling walker (2 wheeled);Straight cane Transfers: Sit to/from Stand Sit to Stand: Min assist         General transfer comment: posterior lean requires min assist for balance; x 2  Ambulation/Gait Ambulation/Gait assistance: Min assist;Mod assist Ambulation Distance (Feet): 100 Feet (seated rest, 80) Assistive device: Rolling  walker (2 wheeled);Straight cane Gait Pattern/deviations: Step-through pattern;Decreased stride length;Shuffle;Trunk flexed;Wide base of support (festinating-like with cane)   Gait velocity interpretation: Below normal speed for age/gender General Gait Details: allowed pt to try straight cane (he prefers and uses at home); pt much more unsteady wtih up to mod assist to prevent falling forwards; with RW pt requires cues to stay close to RW and demonstrates good upright posture and improved balance when he uses RW appropriately   Stairs            Wheelchair Mobility    Modified Rankin (Stroke Patients Only)       Balance Overall balance assessment: Needs assistance   Sitting balance-Leahy Scale: Poor     Standing balance support: Single extremity supported Standing balance-Leahy Scale: Poor                      Cognition Arousal/Alertness: Awake/alert Behavior During Therapy: WFL for tasks assessed/performed Overall Cognitive Status: History of cognitive impairments - at baseline       Memory: Decreased short-term memory              Exercises      General Comments General comments (skin integrity, edema, etc.): Wife present. Inquired re: better to use his 4 wheeled rollator with seat vs 2 wheeled RW. Explained the pro's/con's of each and recommended he use the 2 wheeled RW for now      Pertinent Vitals/Pain Pain Assessment: No/denies pain    Home Living  Prior Function            PT Goals (current goals can now be found in the care plan section) Acute Rehab PT Goals Patient Stated Goal: I'm going home! Time For Goal Achievement: 11/26/15 Progress towards PT goals: Progressing toward goals    Frequency  Min 3X/week    PT Plan Current plan remains appropriate (although pt refuses SNF)    Co-evaluation             End of Session Equipment Utilized During Treatment: Gait belt Activity Tolerance: Patient  tolerated treatment well Patient left: in chair;with call bell/phone within reach;with chair alarm set;with family/visitor present     Time: [id number] PT Time Calculation (min) (ACUTE ONLY): 29 min  Charges:  $Gait Training: 23-37 mins                    G Codes:      Kenric Ginger 2015-12-04, 12:05 PM Pager [phone]

## 2015-11-20 NOTE — Discharge Summary (Signed)
Physician Discharge Summary  Samuel Santiago [facility] [id number] DOB: 10-Jun-1929 DOA: 11/18/2015  PCP: Walker Kehr, MD  Admit date: 11/18/2015 Discharge date: 11/20/2015  Time spent: 35 minutes  Recommendations for Outpatient Follow-up:  1. [facility] for [facility]R check Thrusday - recommended Mr. Isadore get his INR checked every 2-3 days until his levels are stable. 2. Follow-up with PCP in 1-2 weeks. 3. Discharge to home with HHPT - SNF recommended however the patient refuses this option.   Discharge Diagnoses:  Active Problems:   Essential hypertension   Coronary atherosclerosis   Atrial fibrillation (HCC)   Anticoagulation management encounter   Receptive aphasia   Dementia   TIA (transient ischemic attack)   HLD (hyperlipidemia)   Discharge Condition: Stable  Diet recommendation: Heart Healthy, Dysphagia level 3  Filed Weights   11/19/15 0214  Weight: 82.01 kg (180 lb 12.8 oz)    History of present illness:  This is a 82 y.o. male with past medical history of Hyperlipidemia; Colon cancer ([place]) (1990); Hypertension; Ischemic heart disease s/p PCI; Normal nuclear stress test (Feb 2012); History of nephrolithiasis (01/23/2012); Arthritis; GERD; Paroxysmal Atrial Fibrillation) (HCC) on Coumadin.His family reported he was more confused than normal and when he tried to stand up he fell due to weakness, they also noticed slurred speech so they brought him to emergency department for further evaluation.   Hospital Course:  Confusion and slurred speech, suspect TIA: Per wife report the patient has had some generalized weakness and speech difficulty for the past year but on admission he was not at baseline. MRI limited but no definite acute process noted. Tox screen negative. Given the patient's Afib and subtherapeutic INR on admission suspect his represents a TIA. Lipid panel reviewed with LDL 169, started on statin for goal <70. A1c 5.8. Echo with EF 50-50%. Carotid  Doppler without significant stenosis. Continue coumadin per pharmacy-patient unable to afford NOAC, needs strict INR monitoring. Although PT recommends SNF, the patient refused this option, he will be discharged home with HHPT.  Paroxsymal Atrial Fibrillation: Subtherapeutic on warfarin. It appears his INR levels fluctuate (5.0 on Jan 3rd). Alternative oral anticoagulants have been investigated however this is not an option due to financial constraints. The need for anticoagulation for stroke prevention was discussed with the patient and wife in detail, recommended they continue with Coumadin but more frequent INR checks (every 2-3 days) until his levels are more stable. Rate controlled on metoprolol.  Mild leukocytosis, UA not consistent with infection, CXR negative. No fevers or other evidence of infectious process.  CAD with past PCI (2003): Echo showed grade 1 diastolic dysfunction with EF 50-55%. Denies chest pain or dyspnea. Cardiac markers negative. Continue metoprolol, statin and aspirin.  HTN: Resume losartan and HCTZ on discharge.  GERD: Continue Omeprazole  Dementia without behavioral disturbance: the patient lives with his wife, who assists him with many daily activities. Continue Ariecpt.   Procedures:  Echo  Study Conclusions Left ventricle: The cavity size was normal. Wall thickness wasnormal. Systolic function was normal. The estimated ejectionfraction was in the range of 50% to 55%. Wall motion was normal;there were no regional wall motion abnormalities. Dopplerparameters are consistent with abnormal left ventricularrelaxation (grade 1 diastolic dysfunction).  Consultations:  Neurology  Discharge Exam: Filed Vitals:   11/20/15 0542 11/20/15 1024  BP: 164/74 158/69  Pulse: 52 54  Temp: 97.7 F (36.5 C) 97.5 F (36.4 C)  Resp: 16 16    General: Alert, NAD, speech clear, face symmetric Cardiovascular:  no cyanosis, no peripheral edema Respiratory: non labored  respirations Neuro: BLE strength 5/5  Discharge Instructions   Discharge Instructions    Call MD for:  extreme fatigue    Complete by:  As directed      Call MD for:  persistant dizziness or light-headedness    Complete by:  As directed      Diet - low sodium heart healthy    Complete by:  As directed      Increase activity slowly    Complete by:  As directed           Current Discharge Medication List    START taking these medications   Details  aspirin 81 MG chewable tablet Chew 1 tablet (81 mg total) by mouth daily.    atorvastatin (LIPITOR) 40 MG tablet Take 1 tablet (40 mg total) by mouth daily at 6 PM. Qty: 30 tablet, Refills: 0      CONTINUE these medications which have CHANGED   Details  warfarin (COUMADIN) 2.5 MG tablet Take as directed by coumadin clinic- Take 1/2 tablet (1.25 mg) by mouth daily except on Thursday. Follow up with coumadin clinic on 11/22/15-for INR check and further adjustment of dosing Qty: 20 tablet, Refills: 1      CONTINUE these medications which have NOT CHANGED   Details  acetaminophen (TYLENOL) 500 MG tablet Take 500 mg by mouth every 6 (six) hours as needed (pain).    Cholecalciferol (VITAMIN D-3 PO) Take 1 tablet by mouth daily.     donepezil (ARICEPT) 5 MG tablet Take 1 tablet (5 mg total) by mouth at bedtime. Qty: 30 tablet, Refills: 11    escitalopram (LEXAPRO) 10 MG tablet Take 10 mg by mouth daily.    GLUCOSAMINE-CHONDROITIN PO Take 1 tablet by mouth daily.    hydrochlorothiazide (HYDRODIURIL) 25 MG tablet Take 1 tablet (25 mg total) by mouth daily. Qty: 90 tablet, Refills: 3    losartan (COZAAR) 100 MG tablet Take 1 tablet (100 mg total) by mouth daily. Qty: 90 tablet, Refills: 3    metoprolol tartrate (LOPRESSOR) 25 MG tablet TAKE ONE TABLET BY MOUTH TWICE DAILY Qty: 120 tablet, Refills: 0    Multiple Vitamin (MULTIVITAMIN WITH MINERALS) TABS tablet Take 1 tablet by mouth daily.    Omega-3 Fatty Acids (FISH OIL PO)  Take 1 capsule by mouth daily.    omeprazole (PRILOSEC) 20 MG capsule Take 20 mg by mouth daily.    polyvinyl alcohol-povidone (REFRESH) 1.4-0.6 % ophthalmic solution Place 1 drop into both eyes 4 (four) times daily as needed (dry eyes).        Allergies  Allergen Reactions  . Tape Other (See Comments)    Pulled off skin one time--- "it really sticks to me" but will probably tolerate paper tape    Follow-up Information    Follow up with [facility].   Specialty:  Cardiology   Why:  Your appointment is at 10:00 am on Thursday February 2nd.    Contact information:   [address], [place] [phone]      Follow up with [facility].   Why:  you are going to receive home health PT/OT/ Aide. [facility] will contact you to set up the appointment to come to your home.    Contact information:   [address] [place] [address] [phone]       Follow up with Walker Kehr, MD. Schedule  an appointment as soon as possible for a visit in 1 week.   Specialty:  Internal Medicine   Why:  Hospital follow up   Contact information:   [place] [address] [phone]        The results of significant diagnostics from this hospitalization (including imaging, microbiology, ancillary and laboratory) are listed below for reference.    Significant Diagnostic Studies: Dg Chest 2 View  11/19/2015  CLINICAL DATA:  Leukocytosis.  CVA EXAM: CHEST  2 VIEW COMPARISON:  09/18/2015 FINDINGS: Stable borderline cardiomegaly, with size likely accentuated by fat pad. Interstitial crowding. There is no edema, consolidation, effusion, or pneumothorax. No acute osseous findings. IMPRESSION: Negative low volume chest. Electronically Signed   By: Monte Fantasia M.D.   On: 11/19/2015 02:33   Ct Head Wo Contrast  11/18/2015  CLINICAL DATA:  Confusion and combativeness. Altered mental status. Concern for  stroke. EXAM: CT HEAD WITHOUT CONTRAST TECHNIQUE: Contiguous axial images were obtained from the base of the skull through the vertex without intravenous contrast. COMPARISON:  09/18/2015 FINDINGS: Moderate atrophy and advanced chronic small vessel ischemic change, similar in degree to prior exam.No intracranial hemorrhage, mass effect, or midline shift. No hydrocephalus. The basilar cisterns are patent. No evidence of territorial infarct or acute ischemia. No intracranial fluid collection. Calvarium is intact. Included paranasal sinuses are well aerated. Mild opacification of lower left mastoid air cells appears chronic. IMPRESSION: Moderate atrophy and advanced chronic small vessel ischemia. No CT findings of acute ischemia or acute process. Electronically Signed   By: Jeb Levering M.D.   On: 11/18/2015 20:53   Mr Brain Wo Contrast  11/19/2015  CLINICAL DATA:  Initial evaluation for receptive aphasia. EXAM: MRI HEAD WITHOUT CONTRAST TECHNIQUE: Multiplanar, multiecho pulse sequences of the brain and surrounding structures were obtained without intravenous contrast. COMPARISON:  Prior CT from earlier the same day. FINDINGS: Study is limited by motion artifact and patient's inability to tolerate the full length of the exam. Diffuse prominence of the CSF containing spaces is compatible with generalized cerebral atrophy. Confluent T2/FLAIR hyperintensity within the periventricular white matter most compatible with chronic small vessel ischemic disease, moderate to advanced in nature. Small vessel type changes present within the pons. No midline shift or mass effect. No definite mass lesion, although evaluation fairly limited. No definite extra-axial fluid collection. Ventricular prominence related to global parenchymal volume loss without hydrocephalus. Diffusion-weighted imaging demonstrates no convincing evidence for acute infarct. Minimal high signal intensity within the right aspect of the midbrain on axial  DWI sequence image 21 favored to be artifactual in nature. Major intracranial vascular flow voids maintained. Probable dolichoectasia noted. No definite acute abnormality about the orbits. Sequela prior bilateral lens extraction. Paranasal sinuses are grossly clear. Probable small left mastoid effusion. IMPRESSION: 1. Markedly limited study due to motion artifact and patient's inability to tolerate full length of the exam. 2. No definite evidence for acute intracranial infarct or other abnormality. 3. Advanced cerebral atrophy with chronic small vessel ischemic disease. Electronically Signed   By: Jeannine Boga M.D.   On: 11/19/2015 00:36    Microbiology: No results found for this or any previous visit (from the past 240 hour(s)).   Labs: Basic Metabolic Panel:  Recent Labs Lab 11/18/15 2223  NA 139  K 3.8  CL 101  CO2 26  GLUCOSE 160*  BUN 25*  CREATININE 1.04  CALCIUM 10.0   Liver Function Tests:  Recent Labs Lab 11/18/15 2223  AST 25  ALT 25  ALKPHOS 67  BILITOT 1.6*  PROT 6.6  ALBUMIN 3.5   No results for input(s): LIPASE, AMYLASE in the last 168 hours.  Recent Labs Lab 11/19/15 0807  AMMONIA 27   CBC:  Recent Labs Lab 11/18/15 2223  WBC 13.9*  NEUTROABS 12.2*  HGB 16.1  HCT 45.1  MCV 98.5  PLT 147*   Cardiac Enzymes:  Recent Labs Lab 11/19/15 0807 11/19/15 1424 11/19/15 2128 11/20/15 0147 11/20/15 0945  TROPONINI <0.03 <0.03 <0.03 <0.03 <0.03   BNP: BNP (last 3 results) No results for input(s): BNP in the last 8760 hours.  ProBNP (last 3 results) No results for input(s): PROBNP in the last 8760 hours.  CBG: No results for input(s): GLUCAP in the last 168 hours.   Signed: Nena Alexander MD  [facility] 11/20/2015, 11:17 AM

## 2015-11-20 NOTE — Progress Notes (Signed)
Pt for discharge home today. Discharge orders received. IV to left arm and telemetry dcd with dressing clean dry and intact. Discharge instructions and 2 prescriptions given with verbalized understanding.Handouts for stroke prevention reviewed. Family at bedside to assist with discharge. Staff brought patient to lobby via wheelchair at this time. Transported to home by family member.

## 2015-11-20 NOTE — Progress Notes (Signed)
Occupational Therapy Evaluation Patient Details Name: Samuel Santiago MRN: [id number] DOB: [birth year] Today's Date: 11/20/2015    History of Present Illness Came to ED with slurred speech confusion, and fall. MRI brain negative for acute injury. PMHx-recently diagnosed afib (subtherapeutic anticoagulation on admission), dementia, HTN   Clinical Impression   PTA, pt lived at home with wife, who assists pt as needed. Pt completing ADL at overall close S level. Apparent cognitive deficits. Wife states "he just won't do what I ask him to do ". Recommend follow up with HHOT to help wife with behavioral issues most likely related to dementia. Wife in agreement with [place].     Follow Up Recommendations  Home health OT;Supervision/Assistance - 24 hour    Equipment Recommendations  None recommended by OT    Recommendations for Other Services       Precautions / Restrictions Precautions Precautions: Fall Precaution Comments: reports numerous falls over past year; "I just get offbalance"      Mobility Bed Mobility Overal bed mobility: Needs Assistance Bed Mobility: Rolling;Sidelying to Sit Rolling: Modified independent (Device/Increase time) Sidelying to sit: Min guard       General bed mobility comments: up in chair  Transfers Overall transfer level: Needs assistance Equipment used: Rolling walker (2 wheeled);Straight cane Transfers: Sit to/from Stand Sit to Stand: Supervision         General transfer comment: no posterior lean today    Balance Overall balance assessment: Needs assistance   Sitting balance-Leahy Scale: Fair     Standing balance support: Single extremity supported Standing balance-Leahy Scale: Fair                              ADL              No LOB noted during simple ADL task, however, pt requiring vc for problem solving in unfamiliar environment.                            General ADL Comments: overall set up and  S for all ADL tasks                     Pertinent Vitals/Pain Pain Assessment: No/denies pain     Hand Dominance  R   Extremity/Trunk Assessment Upper Extremity Assessment Upper Extremity Assessment: Generalized weakness   Lower Extremity Assessment Lower Extremity Assessment: Generalized weakness   Cervical / Trunk Assessment Cervical / Trunk Assessment: Normal   Communication Communication Communication: HOH   Cognition Arousal/Alertness: Awake/alert Behavior During Therapy: WFL for tasks assessed/performed Overall Cognitive Status: History of cognitive impairments - at baseline       Memory: Decreased short-term memory             General Comments   Given written information on reducing risk of falls at home.     Exercises       Shoulder Instructions      Home Living Family/patient expects to be discharged to:: Private residence Living Arrangements: Spouse/significant other Available Help at Discharge: Family;Available 24 hours/day (wife can only provide supervision) Type of Home: House Home Access: Stairs to enter [employer] of Steps: 3 Entrance Stairs-Rails: Right Home Layout: One level     Bathroom Shower/Tub: Tub/shower unit;Walk-in shower Shower/tub characteristics: Curtain Biochemist, clinical: Handicapped height Bathroom Accessibility: Yes How Accessible: Accessible via walker Home Equipment: [place] - 2 wheels;Walker - 4  wheels;Tub bench;Shower seat;Cane - single point   Additional Comments: Daughter present to confirm      Prior Functioning/Environment Level of Independence: Independent with assistive device(s);Needs assistance (wife assists as needed)        Comments: Pt reports furniture walking at home and bumping into things. Does not like to use cane or walker inside. Uses cane outside home    OT Diagnosis: Generalized weakness;Cognitive deficits   OT Problem List: Decreased strength;Decreased activity  tolerance;Impaired balance (sitting and/or standing);Decreased cognition;Decreased safety awareness;Decreased knowledge of use of DME or AE   OT Treatment/Interventions:      OT Goals(Current goals can be found in the care plan section) Acute Rehab OT Goals Patient Stated Goal: I'm going home! OT Goal Formulation: All assessment and education complete, DC therapy  OT Frequency:     Barriers to D/C:            Co-evaluation              End of Session Nurse Communication: Mobility status  Activity Tolerance: Patient tolerated treatment well Patient left: in chair;with call bell/phone within reach;with chair alarm set   Time: 1150-1210 OT Time Calculation (min): 20 min Charges:  OT General Charges $OT Visit: 1 Procedure OT Evaluation $OT Eval Moderate Complexity: 1 Procedure G-Codes: OT G-codes **NOT FOR INPATIENT CLASS** Functional Assessment Tool Used: clincial judgemetn Functional Limitation: Self care Self Care Current Status [id number]): At least 1 percent but less than 20 percent impaired, limited or restricted Self Care Goal Status [id number]): At least 1 percent but less than 20 percent impaired, limited or restricted Self Care Discharge Status [phone]): At least 1 percent but less than 20 percent impaired, limited or restricted  Shawni Volkov,HILLARY 11/20/2015, 12:18 PM   [facility], OTR/L  [phone] 11/20/2015

## 2015-11-21 ENCOUNTER — Telehealth: Payer: Self-pay | Admitting: *Deleted

## 2015-11-21 NOTE — Telephone Encounter (Signed)
Transition Care Management Follow-up Telephone Call   Date discharged? 11/20/2015   How have you been since you were released from the hospital? Pt states he is feeling ok   Do you understand why you were in the hospital? YES   Do you understand the discharge instructions? YES  Where were you discharged to? Home  Items Reviewed:  Medications reviewed: YES  Allergies reviewed: YES  Dietary changes reviewed: YES, heart healthy  Referrals reviewed: YES, still waiting on [facility] to call   Functional Questionnaire:   Activities of Daily Living (ADLs):   He states he are independent in the following: ambulation, bathing and hygiene, feeding, continence, grooming, toileting and dressing States he require assistance with the following: ambulation   Any transportation issues/concerns?: NO   Any patient concerns? NO   Confirmed importance and date/time of follow-up visits scheduled YES, 11/27/15  Provider Appointment booked with Dr. Alain Marion  Confirmed with patient if condition begins to worsen call PCP or go to the ER.  Patient was given the office number and encouraged to call back with question or concerns.  : YES

## 2015-11-22 ENCOUNTER — Ambulatory Visit (INDEPENDENT_AMBULATORY_CARE_PROVIDER_SITE_OTHER): Payer: Medicare HMO | Admitting: *Deleted

## 2015-11-22 DIAGNOSIS — I4891 Unspecified atrial fibrillation: Secondary | ICD-10-CM

## 2015-11-22 DIAGNOSIS — Z5181 Encounter for therapeutic drug level monitoring: Secondary | ICD-10-CM | POA: Diagnosis not present

## 2015-11-22 DIAGNOSIS — Z7901 Long term (current) use of anticoagulants: Secondary | ICD-10-CM

## 2015-11-22 LAB — POCT INR: INR: 2.3

## 2015-11-22 MED ORDER — WARFARIN SODIUM 1 MG PO TABS: ORAL_TABLET | ORAL | Status: DC

## 2015-11-23 ENCOUNTER — Telehealth: Payer: Self-pay | Admitting: Internal Medicine

## 2015-11-23 DIAGNOSIS — I69828 Other speech and language deficits following other cerebrovascular disease: Secondary | ICD-10-CM | POA: Diagnosis not present

## 2015-11-23 DIAGNOSIS — R69 Illness, unspecified: Secondary | ICD-10-CM | POA: Diagnosis not present

## 2015-11-23 DIAGNOSIS — E785 Hyperlipidemia, unspecified: Secondary | ICD-10-CM | POA: Diagnosis not present

## 2015-11-23 DIAGNOSIS — M1990 Unspecified osteoarthritis, unspecified site: Secondary | ICD-10-CM | POA: Diagnosis not present

## 2015-11-23 DIAGNOSIS — I2510 Atherosclerotic heart disease of native coronary artery without angina pectoris: Secondary | ICD-10-CM | POA: Diagnosis not present

## 2015-11-23 DIAGNOSIS — I69359 Hemiplegia and hemiparesis following cerebral infarction affecting unspecified side: Secondary | ICD-10-CM | POA: Diagnosis not present

## 2015-11-23 DIAGNOSIS — I480 Paroxysmal atrial fibrillation: Secondary | ICD-10-CM | POA: Diagnosis not present

## 2015-11-23 DIAGNOSIS — K219 Gastro-esophageal reflux disease without esophagitis: Secondary | ICD-10-CM | POA: Diagnosis not present

## 2015-11-23 DIAGNOSIS — Z85038 Personal history of other malignant neoplasm of large intestine: Secondary | ICD-10-CM | POA: Diagnosis not present

## 2015-11-23 DIAGNOSIS — I10 Essential (primary) hypertension: Secondary | ICD-10-CM | POA: Diagnosis not present

## 2015-11-23 NOTE — Telephone Encounter (Signed)
Allen from [facility] is needing needing verbal orders for plan of care for 1 x 1wk,  2 x 1 wk,  1 x for 2 wks. He can be reached at [phone]

## 2015-11-26 DIAGNOSIS — I69359 Hemiplegia and hemiparesis following cerebral infarction affecting unspecified side: Secondary | ICD-10-CM | POA: Diagnosis not present

## 2015-11-26 DIAGNOSIS — E785 Hyperlipidemia, unspecified: Secondary | ICD-10-CM | POA: Diagnosis not present

## 2015-11-26 DIAGNOSIS — I2510 Atherosclerotic heart disease of native coronary artery without angina pectoris: Secondary | ICD-10-CM | POA: Diagnosis not present

## 2015-11-26 DIAGNOSIS — K219 Gastro-esophageal reflux disease without esophagitis: Secondary | ICD-10-CM | POA: Diagnosis not present

## 2015-11-26 DIAGNOSIS — R69 Illness, unspecified: Secondary | ICD-10-CM | POA: Diagnosis not present

## 2015-11-26 DIAGNOSIS — I69828 Other speech and language deficits following other cerebrovascular disease: Secondary | ICD-10-CM | POA: Diagnosis not present

## 2015-11-26 DIAGNOSIS — I480 Paroxysmal atrial fibrillation: Secondary | ICD-10-CM | POA: Diagnosis not present

## 2015-11-26 DIAGNOSIS — I10 Essential (primary) hypertension: Secondary | ICD-10-CM | POA: Diagnosis not present

## 2015-11-26 DIAGNOSIS — M1990 Unspecified osteoarthritis, unspecified site: Secondary | ICD-10-CM | POA: Diagnosis not present

## 2015-11-26 DIAGNOSIS — Z85038 Personal history of other malignant neoplasm of large intestine: Secondary | ICD-10-CM | POA: Diagnosis not present

## 2015-11-26 NOTE — Telephone Encounter (Signed)
Verbal orders given to [facility] at [facility] care.

## 2015-11-26 NOTE — Telephone Encounter (Signed)
Ok Thx 

## 2015-11-27 ENCOUNTER — Inpatient Hospital Stay: Payer: Medicare HMO | Admitting: Internal Medicine

## 2015-11-27 ENCOUNTER — Telehealth: Payer: Self-pay | Admitting: Internal Medicine

## 2015-11-27 NOTE — Telephone Encounter (Signed)
Noted. Thx.

## 2015-11-27 NOTE — Telephone Encounter (Signed)
Wants to delay OT until they get clarification on insurance coverage.

## 2015-11-29 ENCOUNTER — Ambulatory Visit (INDEPENDENT_AMBULATORY_CARE_PROVIDER_SITE_OTHER): Payer: Medicare HMO | Admitting: Pharmacist

## 2015-11-29 DIAGNOSIS — K219 Gastro-esophageal reflux disease without esophagitis: Secondary | ICD-10-CM | POA: Diagnosis not present

## 2015-11-29 DIAGNOSIS — Z7901 Long term (current) use of anticoagulants: Secondary | ICD-10-CM | POA: Diagnosis not present

## 2015-11-29 DIAGNOSIS — I480 Paroxysmal atrial fibrillation: Secondary | ICD-10-CM | POA: Diagnosis not present

## 2015-11-29 DIAGNOSIS — M1990 Unspecified osteoarthritis, unspecified site: Secondary | ICD-10-CM | POA: Diagnosis not present

## 2015-11-29 DIAGNOSIS — E785 Hyperlipidemia, unspecified: Secondary | ICD-10-CM | POA: Diagnosis not present

## 2015-11-29 DIAGNOSIS — Z85038 Personal history of other malignant neoplasm of large intestine: Secondary | ICD-10-CM | POA: Diagnosis not present

## 2015-11-29 DIAGNOSIS — I2510 Atherosclerotic heart disease of native coronary artery without angina pectoris: Secondary | ICD-10-CM | POA: Diagnosis not present

## 2015-11-29 DIAGNOSIS — I4891 Unspecified atrial fibrillation: Secondary | ICD-10-CM

## 2015-11-29 DIAGNOSIS — Z5181 Encounter for therapeutic drug level monitoring: Secondary | ICD-10-CM | POA: Diagnosis not present

## 2015-11-29 DIAGNOSIS — R69 Illness, unspecified: Secondary | ICD-10-CM | POA: Diagnosis not present

## 2015-11-29 DIAGNOSIS — I69828 Other speech and language deficits following other cerebrovascular disease: Secondary | ICD-10-CM | POA: Diagnosis not present

## 2015-11-29 DIAGNOSIS — I10 Essential (primary) hypertension: Secondary | ICD-10-CM | POA: Diagnosis not present

## 2015-11-29 DIAGNOSIS — I69359 Hemiplegia and hemiparesis following cerebral infarction affecting unspecified side: Secondary | ICD-10-CM | POA: Diagnosis not present

## 2015-11-29 LAB — POCT INR: INR: 2

## 2015-12-05 DIAGNOSIS — H26493 Other secondary cataract, bilateral: Secondary | ICD-10-CM | POA: Diagnosis not present

## 2015-12-05 DIAGNOSIS — Z961 Presence of intraocular lens: Secondary | ICD-10-CM | POA: Diagnosis not present

## 2015-12-06 DIAGNOSIS — E785 Hyperlipidemia, unspecified: Secondary | ICD-10-CM | POA: Diagnosis not present

## 2015-12-06 DIAGNOSIS — I69828 Other speech and language deficits following other cerebrovascular disease: Secondary | ICD-10-CM | POA: Diagnosis not present

## 2015-12-06 DIAGNOSIS — I69359 Hemiplegia and hemiparesis following cerebral infarction affecting unspecified side: Secondary | ICD-10-CM | POA: Diagnosis not present

## 2015-12-06 DIAGNOSIS — R69 Illness, unspecified: Secondary | ICD-10-CM | POA: Diagnosis not present

## 2015-12-06 DIAGNOSIS — M1990 Unspecified osteoarthritis, unspecified site: Secondary | ICD-10-CM | POA: Diagnosis not present

## 2015-12-06 DIAGNOSIS — I480 Paroxysmal atrial fibrillation: Secondary | ICD-10-CM | POA: Diagnosis not present

## 2015-12-06 DIAGNOSIS — Z85038 Personal history of other malignant neoplasm of large intestine: Secondary | ICD-10-CM | POA: Diagnosis not present

## 2015-12-06 DIAGNOSIS — I10 Essential (primary) hypertension: Secondary | ICD-10-CM | POA: Diagnosis not present

## 2015-12-06 DIAGNOSIS — K219 Gastro-esophageal reflux disease without esophagitis: Secondary | ICD-10-CM | POA: Diagnosis not present

## 2015-12-06 DIAGNOSIS — I2510 Atherosclerotic heart disease of native coronary artery without angina pectoris: Secondary | ICD-10-CM | POA: Diagnosis not present

## 2015-12-10 ENCOUNTER — Ambulatory Visit (INDEPENDENT_AMBULATORY_CARE_PROVIDER_SITE_OTHER): Payer: Medicare HMO | Admitting: *Deleted

## 2015-12-10 ENCOUNTER — Telehealth: Payer: Self-pay

## 2015-12-10 DIAGNOSIS — I69828 Other speech and language deficits following other cerebrovascular disease: Secondary | ICD-10-CM | POA: Diagnosis not present

## 2015-12-10 DIAGNOSIS — I69359 Hemiplegia and hemiparesis following cerebral infarction affecting unspecified side: Secondary | ICD-10-CM | POA: Diagnosis not present

## 2015-12-10 DIAGNOSIS — Z5181 Encounter for therapeutic drug level monitoring: Secondary | ICD-10-CM | POA: Diagnosis not present

## 2015-12-10 DIAGNOSIS — Z7901 Long term (current) use of anticoagulants: Secondary | ICD-10-CM

## 2015-12-10 DIAGNOSIS — I480 Paroxysmal atrial fibrillation: Secondary | ICD-10-CM | POA: Diagnosis not present

## 2015-12-10 DIAGNOSIS — I2510 Atherosclerotic heart disease of native coronary artery without angina pectoris: Secondary | ICD-10-CM | POA: Diagnosis not present

## 2015-12-10 DIAGNOSIS — I4891 Unspecified atrial fibrillation: Secondary | ICD-10-CM | POA: Diagnosis not present

## 2015-12-10 LAB — POCT INR: INR: 1.5

## 2015-12-10 NOTE — Telephone Encounter (Signed)
Home Health Cert/Plan of Care received (11/23/2015 - 01/21/2016) and placed on MD's desk for signature

## 2015-12-12 ENCOUNTER — Encounter: Payer: Self-pay | Admitting: Nurse Practitioner

## 2015-12-12 ENCOUNTER — Ambulatory Visit (INDEPENDENT_AMBULATORY_CARE_PROVIDER_SITE_OTHER): Payer: Medicare HMO | Admitting: Nurse Practitioner

## 2015-12-12 VITALS — BP 140/80 | HR 72 | Resp 18 | Wt 179.0 lb

## 2015-12-12 DIAGNOSIS — I480 Paroxysmal atrial fibrillation: Secondary | ICD-10-CM

## 2015-12-12 DIAGNOSIS — E785 Hyperlipidemia, unspecified: Secondary | ICD-10-CM | POA: Diagnosis not present

## 2015-12-12 DIAGNOSIS — I10 Essential (primary) hypertension: Secondary | ICD-10-CM

## 2015-12-12 DIAGNOSIS — I259 Chronic ischemic heart disease, unspecified: Secondary | ICD-10-CM

## 2015-12-12 DIAGNOSIS — I4891 Unspecified atrial fibrillation: Secondary | ICD-10-CM | POA: Diagnosis not present

## 2015-12-12 LAB — BASIC METABOLIC PANEL
BUN: 24 mg/dL (ref 7–25)
CO2: 26 mmol/L (ref 20–31)
Calcium: 9.3 mg/dL (ref 8.6–10.3)
Chloride: 99 mmol/L (ref 98–110)
Creat: 1.14 mg/dL — ABNORMAL HIGH (ref 0.70–1.11)
Glucose, Bld: 91 mg/dL (ref 65–99)
Potassium: 4.2 mmol/L (ref 3.5–5.3)
Sodium: 135 mmol/L (ref 135–146)

## 2015-12-12 MED ORDER — ATORVASTATIN CALCIUM 10 MG PO TABS: 10.0000 mg | ORAL_TABLET | Freq: Every day | ORAL | Status: DC

## 2015-12-12 MED ORDER — ESCITALOPRAM OXALATE 10 MG PO TABS: 10.0000 mg | ORAL_TABLET | Freq: Every day | ORAL | Status: DC

## 2015-12-12 NOTE — Patient Instructions (Addendum)
We will be checking the following labs today - BMET   Medication Instructions:    Continue with your current medicines. BUT  I am adding Lipitor 10 mg a day - this is at the drug store   Testing/Procedures To Be Arranged:  N/A  Follow-Up:   See me in April as planned.    Other Special Instructions:   N/A    If you need a refill on your cardiac medications before your next appointment, please call your pharmacy.   Call the [place] office at [phone] if you have any questions, problems or concerns.
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
## 2015-12-13 NOTE — Telephone Encounter (Signed)
Paperwork signed, faxed, copy sent to scan 

## 2015-12-17 ENCOUNTER — Ambulatory Visit: Payer: Medicare HMO | Admitting: Internal Medicine

## 2015-12-20 ENCOUNTER — Ambulatory Visit (INDEPENDENT_AMBULATORY_CARE_PROVIDER_SITE_OTHER): Payer: Medicare HMO | Admitting: *Deleted

## 2015-12-20 DIAGNOSIS — Z5181 Encounter for therapeutic drug level monitoring: Secondary | ICD-10-CM | POA: Diagnosis not present

## 2015-12-20 DIAGNOSIS — Z7901 Long term (current) use of anticoagulants: Secondary | ICD-10-CM | POA: Diagnosis not present

## 2015-12-20 DIAGNOSIS — I4891 Unspecified atrial fibrillation: Secondary | ICD-10-CM | POA: Diagnosis not present

## 2015-12-20 LAB — POCT INR: INR: 1.4

## 2015-12-26 ENCOUNTER — Ambulatory Visit: Payer: Medicare HMO | Admitting: Internal Medicine

## 2015-12-27 ENCOUNTER — Ambulatory Visit (INDEPENDENT_AMBULATORY_CARE_PROVIDER_SITE_OTHER): Payer: Medicare HMO | Admitting: *Deleted

## 2015-12-27 DIAGNOSIS — Z7901 Long term (current) use of anticoagulants: Secondary | ICD-10-CM

## 2015-12-27 DIAGNOSIS — I4891 Unspecified atrial fibrillation: Secondary | ICD-10-CM | POA: Diagnosis not present

## 2015-12-27 DIAGNOSIS — Z5181 Encounter for therapeutic drug level monitoring: Secondary | ICD-10-CM | POA: Diagnosis not present

## 2015-12-27 LAB — POCT INR: INR: 1.8

## 2016-01-03 ENCOUNTER — Encounter: Payer: Self-pay | Admitting: Internal Medicine

## 2016-01-03 ENCOUNTER — Ambulatory Visit (INDEPENDENT_AMBULATORY_CARE_PROVIDER_SITE_OTHER): Payer: Medicare HMO | Admitting: Internal Medicine

## 2016-01-03 VITALS — BP 132/60 | HR 83 | Wt 184.0 lb

## 2016-01-03 DIAGNOSIS — I10 Essential (primary) hypertension: Secondary | ICD-10-CM

## 2016-01-03 DIAGNOSIS — Z5181 Encounter for therapeutic drug level monitoring: Secondary | ICD-10-CM

## 2016-01-03 DIAGNOSIS — I4891 Unspecified atrial fibrillation: Secondary | ICD-10-CM | POA: Diagnosis not present

## 2016-01-03 DIAGNOSIS — I480 Paroxysmal atrial fibrillation: Secondary | ICD-10-CM | POA: Diagnosis not present

## 2016-01-03 DIAGNOSIS — I2510 Atherosclerotic heart disease of native coronary artery without angina pectoris: Secondary | ICD-10-CM

## 2016-01-03 DIAGNOSIS — Z7901 Long term (current) use of anticoagulants: Secondary | ICD-10-CM

## 2016-01-03 DIAGNOSIS — R413 Other amnesia: Secondary | ICD-10-CM

## 2016-01-03 DIAGNOSIS — G459 Transient cerebral ischemic attack, unspecified: Secondary | ICD-10-CM

## 2016-01-03 MED ORDER — ESCITALOPRAM OXALATE 10 MG PO TABS: 10.0000 mg | ORAL_TABLET | Freq: Every day | ORAL | Status: DC

## 2016-01-03 NOTE — Assessment & Plan Note (Signed)
HCTZ, Toprol, Losartan

## 2016-01-03 NOTE — Progress Notes (Signed)
Pre visit review using our clinic review tool, if applicable. No additional management support is needed unless otherwise documented below in the visit note. 

## 2016-01-03 NOTE — Assessment & Plan Note (Signed)
Coumadin po 

## 2016-01-03 NOTE — Assessment & Plan Note (Signed)
Xarelto - too $$$ On Coumadin

## 2016-01-03 NOTE — Assessment & Plan Note (Signed)
1/17 resolved Coumadin Hosp records reviewed

## 2016-01-03 NOTE — Assessment & Plan Note (Signed)
Simvastatin, ASA, Toprol, Losartan

## 2016-01-03 NOTE — Assessment & Plan Note (Signed)
Xarelto - too $$$ On Coumadin, Toprol

## 2016-01-03 NOTE — Progress Notes (Signed)
Subjective:  Patient ID: Samuel Santiago, male    DOB: 03-Jun-1929  Age: 82 y.o. MRN: [id number]  CC: No chief complaint on file.   HPI Samuel Santiago [id number] presents for A fib, depression, dementia, TIA f/u  Outpatient Prescriptions Prior to Visit  Medication Sig Dispense Refill  . acetaminophen (TYLENOL) 500 MG tablet Take 500 mg by mouth every 6 (six) hours as needed (pain).    Marland Kitchen aspirin 81 MG chewable tablet Chew 1 tablet (81 mg total) by mouth daily.    Marland Kitchen atorvastatin (LIPITOR) 10 MG tablet Take 1 tablet (10 mg total) by mouth daily. 90 tablet 3  . Cholecalciferol (VITAMIN D-3 PO) Take 1 tablet by mouth daily.     Marland Kitchen donepezil (ARICEPT) 5 MG tablet Take 1 tablet (5 mg total) by mouth at bedtime. 30 tablet 11  . escitalopram (LEXAPRO) 10 MG tablet Take 1 tablet (10 mg total) by mouth daily.    Marland Kitchen GLUCOSAMINE-CHONDROITIN PO Take 1 tablet by mouth daily.    . hydrochlorothiazide (HYDRODIURIL) 25 MG tablet Take 1 tablet (25 mg total) by mouth daily. (Patient taking differently: Take 25 mg by mouth at bedtime. ) 90 tablet 3  . losartan (COZAAR) 100 MG tablet Take 1 tablet (100 mg total) by mouth daily. 90 tablet 3  . metoprolol tartrate (LOPRESSOR) 25 MG tablet TAKE ONE TABLET BY MOUTH TWICE DAILY 120 tablet 0  . Multiple Vitamin (MULTIVITAMIN WITH MINERALS) TABS tablet Take 1 tablet by mouth daily.    . Omega-3 Fatty Acids (FISH OIL PO) Take 1 capsule by mouth daily.    Marland Kitchen omeprazole (PRILOSEC) 20 MG capsule Take 20 mg by mouth daily.    . polyvinyl alcohol-povidone (REFRESH) 1.4-0.6 % ophthalmic solution Place 1 drop into both eyes 4 (four) times daily as needed (dry eyes).     . warfarin (COUMADIN) 1 MG tablet Take as directed by coumadin clinic 40 tablet 2   No facility-administered medications prior to visit.    ROS Review of Systems  Constitutional: Negative for appetite change, fatigue and unexpected weight change.  HENT: Negative for congestion, nosebleeds, sneezing, sore throat  and trouble swallowing.   Eyes: Negative for itching and visual disturbance.  Respiratory: Negative for cough.   Cardiovascular: Negative for chest pain, palpitations and leg swelling.  Gastrointestinal: Negative for nausea, diarrhea, blood in stool and abdominal distention.  Genitourinary: Negative for frequency and hematuria.  Musculoskeletal: Positive for arthralgias. Negative for back pain, joint swelling, gait problem and neck pain.  Skin: Negative for rash.  Neurological: Positive for weakness. Negative for dizziness, tremors and speech difficulty.  Psychiatric/Behavioral: Positive for decreased concentration. Negative for suicidal ideas, confusion, sleep disturbance, dysphoric mood and agitation. The patient is not nervous/anxious.     Objective:  BP 132/60 mmHg  Pulse 83  Wt 184 lb (83.462 kg)  SpO2 96%  BP Readings from Last 3 Encounters:  01/03/16 132/60  12/12/15 140/80  11/20/15 158/69    Wt Readings from Last 3 Encounters:  01/03/16 184 lb (83.462 kg)  12/12/15 179 lb (81.194 kg)  11/19/15 180 lb 12.8 oz (82.01 kg)    Physical Exam  Constitutional: He is oriented to person, place, and time. He appears well-developed. No distress.  NAD  HENT:  Mouth/Throat: Oropharynx is clear and moist.  Eyes: Conjunctivae are normal. Pupils are equal, round, and reactive to light.  Neck: Normal range of motion. No JVD present. No thyromegaly present.  Cardiovascular: Normal rate, regular rhythm, normal heart  sounds and intact distal pulses.  Exam reveals no gallop and no friction rub.   No murmur heard. Pulmonary/Chest: Effort normal and breath sounds normal. No respiratory distress. He has no wheezes. He has no rales. He exhibits no tenderness.  Abdominal: Soft. Bowel sounds are normal. He exhibits no distension and no mass. There is no tenderness. There is no rebound and no guarding.  Musculoskeletal: Normal range of motion. He exhibits no edema or tenderness.    Lymphadenopathy:    He has no cervical adenopathy.  Neurological: He is alert and oriented to person, place, and time. He has normal reflexes. No cranial nerve deficit. He exhibits normal muscle tone. He displays a negative Romberg sign. Coordination abnormal. Gait normal.  Skin: Skin is warm and dry. No rash noted.  Psychiatric: He has a normal mood and affect. His behavior is normal. Judgment normal.  cane Nails w/splinter hemorrhages - (xyears per wife)   Lab Results  Component Value Date   WBC 13.9* 11/18/2015   HGB 16.1 11/18/2015   HCT 45.1 11/18/2015   PLT 147* 11/18/2015   GLUCOSE 91 12/12/2015   CHOL 237* 11/19/2015   TRIG 99 11/19/2015   HDL 48 11/19/2015   LDLCALC 169* 11/19/2015   ALT 25 11/18/2015   AST 25 11/18/2015   NA 135 12/12/2015   K 4.2 12/12/2015   CL 99 12/12/2015   CREATININE 1.14* 12/12/2015   BUN 24 12/12/2015   CO2 26 12/12/2015   TSH 0.722 09/19/2015   PSA 1.12 01/27/2013   INR 1.8 12/27/2015   HGBA1C 5.8* 11/19/2015    Dg Chest 2 View  11/19/2015  CLINICAL DATA:  Leukocytosis.  CVA EXAM: CHEST  2 VIEW COMPARISON:  09/18/2015 FINDINGS: Stable borderline cardiomegaly, with size likely accentuated by fat pad. Interstitial crowding. There is no edema, consolidation, effusion, or pneumothorax. No acute osseous findings. IMPRESSION: Negative low volume chest. Electronically Signed   By: Monte Fantasia M.D.   On: 11/19/2015 02:33   Ct Head Wo Contrast  11/18/2015  CLINICAL DATA:  Confusion and combativeness. Altered mental status. Concern for stroke. EXAM: CT HEAD WITHOUT CONTRAST TECHNIQUE: Contiguous axial images were obtained from the base of the skull through the vertex without intravenous contrast. COMPARISON:  09/18/2015 FINDINGS: Moderate atrophy and advanced chronic small vessel ischemic change, similar in degree to prior exam.No intracranial hemorrhage, mass effect, or midline shift. No hydrocephalus. The basilar cisterns are patent. No evidence  of territorial infarct or acute ischemia. No intracranial fluid collection. Calvarium is intact. Included paranasal sinuses are well aerated. Mild opacification of lower left mastoid air cells appears chronic. IMPRESSION: Moderate atrophy and advanced chronic small vessel ischemia. No CT findings of acute ischemia or acute process. Electronically Signed   By: Jeb Levering M.D.   On: 11/18/2015 20:53   Mr Brain Wo Contrast  11/19/2015  CLINICAL DATA:  Initial evaluation for receptive aphasia. EXAM: MRI HEAD WITHOUT CONTRAST TECHNIQUE: Multiplanar, multiecho pulse sequences of the brain and surrounding structures were obtained without intravenous contrast. COMPARISON:  Prior CT from earlier the same day. FINDINGS: Study is limited by motion artifact and patient's inability to tolerate the full length of the exam. Diffuse prominence of the CSF containing spaces is compatible with generalized cerebral atrophy. Confluent T2/FLAIR hyperintensity within the periventricular white matter most compatible with chronic small vessel ischemic disease, moderate to advanced in nature. Small vessel type changes present within the pons. No midline shift or mass effect. No definite mass lesion, although evaluation  fairly limited. No definite extra-axial fluid collection. Ventricular prominence related to global parenchymal volume loss without hydrocephalus. Diffusion-weighted imaging demonstrates no convincing evidence for acute infarct. Minimal high signal intensity within the right aspect of the midbrain on axial DWI sequence image 21 favored to be artifactual in nature. Major intracranial vascular flow voids maintained. Probable dolichoectasia noted. No definite acute abnormality about the orbits. Sequela prior bilateral lens extraction. Paranasal sinuses are grossly clear. Probable small left mastoid effusion. IMPRESSION: 1. Markedly limited study due to motion artifact and patient's inability to tolerate full length of the  exam. 2. No definite evidence for acute intracranial infarct or other abnormality. 3. Advanced cerebral atrophy with chronic small vessel ischemic disease. Electronically Signed   By: Jeannine Boga M.D.   On: 11/19/2015 00:36    Assessment & Plan:   There are no diagnoses linked to this encounter. I am having Samuel Santiago maintain his multivitamin with minerals, Omega-3 Fatty Acids (FISH OIL PO), Cholecalciferol (VITAMIN D-3 PO), polyvinyl alcohol-povidone, donepezil, omeprazole, losartan, hydrochlorothiazide, metoprolol tartrate, GLUCOSAMINE-CHONDROITIN PO, acetaminophen, aspirin, warfarin, atorvastatin, and escitalopram.  No orders of the defined types were placed in this encounter.     Follow-up: No Follow-up on file.  Walker Kehr, MD

## 2016-01-03 NOTE — Assessment & Plan Note (Signed)
Chronic, mild dementia On Lexapro - better  Aricept

## 2016-01-04 ENCOUNTER — Ambulatory Visit (INDEPENDENT_AMBULATORY_CARE_PROVIDER_SITE_OTHER): Payer: Medicare HMO | Admitting: *Deleted

## 2016-01-04 DIAGNOSIS — H26493 Other secondary cataract, bilateral: Secondary | ICD-10-CM | POA: Diagnosis not present

## 2016-01-04 DIAGNOSIS — I4891 Unspecified atrial fibrillation: Secondary | ICD-10-CM | POA: Diagnosis not present

## 2016-01-04 DIAGNOSIS — Z5181 Encounter for therapeutic drug level monitoring: Secondary | ICD-10-CM

## 2016-01-04 DIAGNOSIS — Z7901 Long term (current) use of anticoagulants: Secondary | ICD-10-CM

## 2016-01-04 LAB — POCT INR: INR: 2.6

## 2016-01-10 ENCOUNTER — Ambulatory Visit (INDEPENDENT_AMBULATORY_CARE_PROVIDER_SITE_OTHER): Payer: Medicare HMO | Admitting: *Deleted

## 2016-01-10 DIAGNOSIS — Z7901 Long term (current) use of anticoagulants: Secondary | ICD-10-CM | POA: Diagnosis not present

## 2016-01-10 DIAGNOSIS — Z5181 Encounter for therapeutic drug level monitoring: Secondary | ICD-10-CM

## 2016-01-10 DIAGNOSIS — I4891 Unspecified atrial fibrillation: Secondary | ICD-10-CM

## 2016-01-10 LAB — POCT INR: INR: 2.7

## 2016-01-16 ENCOUNTER — Ambulatory Visit (INDEPENDENT_AMBULATORY_CARE_PROVIDER_SITE_OTHER): Payer: Medicare HMO | Admitting: *Deleted

## 2016-01-16 ENCOUNTER — Other Ambulatory Visit: Payer: Self-pay | Admitting: Cardiology

## 2016-01-16 DIAGNOSIS — Z5181 Encounter for therapeutic drug level monitoring: Secondary | ICD-10-CM

## 2016-01-16 DIAGNOSIS — I4891 Unspecified atrial fibrillation: Secondary | ICD-10-CM | POA: Diagnosis not present

## 2016-01-16 DIAGNOSIS — Z7901 Long term (current) use of anticoagulants: Secondary | ICD-10-CM | POA: Diagnosis not present

## 2016-01-16 LAB — POCT INR: INR: 2.8

## 2016-01-30 ENCOUNTER — Ambulatory Visit (INDEPENDENT_AMBULATORY_CARE_PROVIDER_SITE_OTHER): Payer: Medicare HMO | Admitting: Nurse Practitioner

## 2016-01-30 ENCOUNTER — Encounter: Payer: Self-pay | Admitting: Nurse Practitioner

## 2016-01-30 ENCOUNTER — Ambulatory Visit (INDEPENDENT_AMBULATORY_CARE_PROVIDER_SITE_OTHER): Payer: Medicare HMO | Admitting: *Deleted

## 2016-01-30 ENCOUNTER — Other Ambulatory Visit: Payer: Self-pay | Admitting: Pharmacist

## 2016-01-30 VITALS — BP 150/90 | HR 56 | Ht 66.0 in | Wt 184.8 lb

## 2016-01-30 DIAGNOSIS — Z5181 Encounter for therapeutic drug level monitoring: Secondary | ICD-10-CM

## 2016-01-30 DIAGNOSIS — I10 Essential (primary) hypertension: Secondary | ICD-10-CM

## 2016-01-30 DIAGNOSIS — I259 Chronic ischemic heart disease, unspecified: Secondary | ICD-10-CM

## 2016-01-30 DIAGNOSIS — E785 Hyperlipidemia, unspecified: Secondary | ICD-10-CM

## 2016-01-30 DIAGNOSIS — Z7901 Long term (current) use of anticoagulants: Secondary | ICD-10-CM

## 2016-01-30 DIAGNOSIS — I4891 Unspecified atrial fibrillation: Secondary | ICD-10-CM

## 2016-01-30 DIAGNOSIS — I480 Paroxysmal atrial fibrillation: Secondary | ICD-10-CM

## 2016-01-30 LAB — LIPID PANEL
Cholesterol: 156 mg/dL (ref 125–200)
HDL: 47 mg/dL (ref >=40)
LDL Cholesterol: 75 mg/dL (ref <130)
Total CHOL/HDL Ratio: 3.3 Ratio (ref <=5.0)
Triglycerides: 170 mg/dL — ABNORMAL HIGH (ref <150)
VLDL: 34 mg/dL — ABNORMAL HIGH (ref <30)

## 2016-01-30 LAB — BASIC METABOLIC PANEL
BUN: 26 mg/dL — ABNORMAL HIGH (ref 7–25)
CO2: 32 mmol/L — ABNORMAL HIGH (ref 20–31)
Calcium: 9.3 mg/dL (ref 8.6–10.3)
Chloride: 102 mmol/L (ref 98–110)
Creat: 1.13 mg/dL — ABNORMAL HIGH (ref 0.70–1.11)
Glucose, Bld: 84 mg/dL (ref 65–99)
Potassium: 4.5 mmol/L (ref 3.5–5.3)
Sodium: 140 mmol/L (ref 135–146)

## 2016-01-30 LAB — HEPATIC FUNCTION PANEL
ALT: 21 U/L (ref 9–46)
AST: 22 U/L (ref 10–35)
Albumin: 4 g/dL (ref 3.6–5.1)
Alkaline Phosphatase: 57 U/L (ref 40–115)
Bilirubin, Direct: 0.2 mg/dL (ref <=0.2)
Indirect Bilirubin: 0.8 mg/dL (ref 0.2–1.2)
Total Bilirubin: 1 mg/dL (ref 0.2–1.2)
Total Protein: 6.6 g/dL (ref 6.1–8.1)

## 2016-01-30 LAB — POCT INR: INR: 2.8

## 2016-01-30 MED ORDER — WARFARIN SODIUM 1 MG PO TABS: ORAL_TABLET | ORAL | Status: DC
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
## 2016-01-30 NOTE — Patient Instructions (Addendum)
We will be checking the following labs today - BMET, HPF and lipids   Medication Instructions:    Continue with your current medicines.     Testing/Procedures To Be Arranged:  N/A  Follow-Up:   See me in 6 months    Other Special Instructions:   Try to get more activity!  Get the stationary bike back in the house and start using.     If you need a refill on your cardiac medications before your next appointment, please call your pharmacy.   Call the [place] office at [phone] if you have any questions, problems or concerns.

## 2016-02-20 ENCOUNTER — Ambulatory Visit (INDEPENDENT_AMBULATORY_CARE_PROVIDER_SITE_OTHER): Payer: Medicare HMO | Admitting: *Deleted

## 2016-02-20 DIAGNOSIS — I4891 Unspecified atrial fibrillation: Secondary | ICD-10-CM

## 2016-02-20 DIAGNOSIS — Z5181 Encounter for therapeutic drug level monitoring: Secondary | ICD-10-CM | POA: Diagnosis not present

## 2016-02-20 DIAGNOSIS — Z7901 Long term (current) use of anticoagulants: Secondary | ICD-10-CM | POA: Diagnosis not present

## 2016-02-20 LAB — POCT INR: INR: 2.6

## 2016-02-22 DIAGNOSIS — Z961 Presence of intraocular lens: Secondary | ICD-10-CM | POA: Diagnosis not present

## 2016-03-14 ENCOUNTER — Ambulatory Visit (INDEPENDENT_AMBULATORY_CARE_PROVIDER_SITE_OTHER): Payer: Medicare HMO | Admitting: *Deleted

## 2016-03-14 DIAGNOSIS — Z5181 Encounter for therapeutic drug level monitoring: Secondary | ICD-10-CM

## 2016-03-14 DIAGNOSIS — I4891 Unspecified atrial fibrillation: Secondary | ICD-10-CM | POA: Diagnosis not present

## 2016-03-14 DIAGNOSIS — Z7901 Long term (current) use of anticoagulants: Secondary | ICD-10-CM | POA: Diagnosis not present

## 2016-03-14 LAB — POCT INR: INR: 2.9

## 2016-03-24 ENCOUNTER — Telehealth: Payer: Self-pay | Admitting: Nurse Practitioner
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
## 2016-03-24 NOTE — Telephone Encounter (Signed)
Discussed these symptoms with Cecille Rubin after Mrs Faver called approx 30 minutes ago to [facility] trying to reach Tera Helper. NP. She feels that it could be a progression of his Dementia and needs a follow up with Neuro verses Psych but instructed me to tell spouse and I did to call Dr Alain Marion his PCP and relay this  Information to him and see what specialist he would refer pt to. This same information was given to daughter, Jan when I returned the call to her.

## 2016-03-26 ENCOUNTER — Ambulatory Visit (HOSPITAL_COMMUNITY)
Admission: RE | Admit: 2016-03-26 | Discharge: 2016-03-26 | Disposition: A | Discharge status: Home / Self Care | Payer: Medicare HMO | Attending: Psychiatry | Admitting: Psychiatry

## 2016-03-26 ENCOUNTER — Other Ambulatory Visit (INDEPENDENT_AMBULATORY_CARE_PROVIDER_SITE_OTHER): Payer: Medicare HMO

## 2016-03-26 ENCOUNTER — Ambulatory Visit (INDEPENDENT_AMBULATORY_CARE_PROVIDER_SITE_OTHER): Payer: Medicare HMO | Admitting: Internal Medicine

## 2016-03-26 ENCOUNTER — Encounter: Payer: Self-pay | Admitting: Internal Medicine

## 2016-03-26 VITALS — BP 140/74 | HR 62 | Wt 187.0 lb

## 2016-03-26 DIAGNOSIS — I10 Essential (primary) hypertension: Secondary | ICD-10-CM | POA: Insufficient documentation

## 2016-03-26 DIAGNOSIS — Z85038 Personal history of other malignant neoplasm of large intestine: Secondary | ICD-10-CM | POA: Diagnosis not present

## 2016-03-26 DIAGNOSIS — F0390 Unspecified dementia without behavioral disturbance: Secondary | ICD-10-CM | POA: Insufficient documentation

## 2016-03-26 DIAGNOSIS — I2510 Atherosclerotic heart disease of native coronary artery without angina pectoris: Secondary | ICD-10-CM | POA: Diagnosis not present

## 2016-03-26 DIAGNOSIS — I480 Paroxysmal atrial fibrillation: Secondary | ICD-10-CM | POA: Insufficient documentation

## 2016-03-26 DIAGNOSIS — I509 Heart failure, unspecified: Secondary | ICD-10-CM | POA: Insufficient documentation

## 2016-03-26 DIAGNOSIS — I252 Old myocardial infarction: Secondary | ICD-10-CM | POA: Diagnosis not present

## 2016-03-26 DIAGNOSIS — R202 Paresthesia of skin: Secondary | ICD-10-CM

## 2016-03-26 DIAGNOSIS — K219 Gastro-esophageal reflux disease without esophagitis: Secondary | ICD-10-CM | POA: Diagnosis not present

## 2016-03-26 DIAGNOSIS — G3184 Mild cognitive impairment, so stated: Secondary | ICD-10-CM | POA: Diagnosis present

## 2016-03-26 DIAGNOSIS — Z7901 Long term (current) use of anticoagulants: Secondary | ICD-10-CM | POA: Diagnosis not present

## 2016-03-26 DIAGNOSIS — F22 Delusional disorders: Secondary | ICD-10-CM | POA: Diagnosis not present

## 2016-03-26 DIAGNOSIS — R69 Illness, unspecified: Secondary | ICD-10-CM | POA: Diagnosis not present

## 2016-03-26 DIAGNOSIS — I259 Chronic ischemic heart disease, unspecified: Secondary | ICD-10-CM | POA: Insufficient documentation

## 2016-03-26 DIAGNOSIS — R4189 Other symptoms and signs involving cognitive functions and awareness: Secondary | ICD-10-CM

## 2016-03-26 DIAGNOSIS — E785 Hyperlipidemia, unspecified: Secondary | ICD-10-CM | POA: Insufficient documentation

## 2016-03-26 LAB — BASIC METABOLIC PANEL
BUN: 31 mg/dL — ABNORMAL HIGH (ref 6–23)
CALCIUM: 9.7 mg/dL (ref 8.4–10.5)
CO2: 31 meq/L (ref 19–32)
CREATININE: 1.18 mg/dL (ref 0.40–1.50)
Chloride: 102 mEq/L (ref 96–112)
GFR: 62.42 mL/min (ref >60.00)
Glucose, Bld: 118 mg/dL — ABNORMAL HIGH (ref 70–99)
Potassium: 4.3 mEq/L (ref 3.5–5.1)
SODIUM: 137 meq/L (ref 135–145)

## 2016-03-26 LAB — URINALYSIS
BILIRUBIN URINE: NEGATIVE
HGB URINE DIPSTICK: NEGATIVE
Ketones, ur: NEGATIVE
Leukocytes, UA: NEGATIVE
NITRITE: NEGATIVE
Specific Gravity, Urine: 1.015 (ref 1.000–1.030)
Total Protein, Urine: NEGATIVE
UROBILINOGEN UA: 0.2 (ref 0.0–1.0)
Urine Glucose: NEGATIVE
pH: 5.5 (ref 5.0–8.0)

## 2016-03-26 LAB — HEPATIC FUNCTION PANEL
ALBUMIN: 3.9 g/dL (ref 3.5–5.2)
ALT: 21 U/L (ref 0–53)
AST: 22 U/L (ref 0–37)
Alkaline Phosphatase: 51 U/L (ref 39–117)
BILIRUBIN TOTAL: 0.8 mg/dL (ref 0.2–1.2)
Bilirubin, Direct: 0.2 mg/dL (ref 0.0–0.3)
Total Protein: 6.9 g/dL (ref 6.0–8.3)

## 2016-03-26 LAB — CBC WITH DIFFERENTIAL/PLATELET
Basophils Absolute: 0.1 10*3/uL (ref 0.0–0.1)
Basophils Relative: 0.6 % (ref 0.0–3.0)
EOS PCT: 3.6 % (ref 0.0–5.0)
Eosinophils Absolute: 0.3 10*3/uL (ref 0.0–0.7)
HCT: 41.9 % (ref 39.0–52.0)
Hemoglobin: 14.3 g/dL (ref 13.0–17.0)
LYMPHS ABS: 1.5 10*3/uL (ref 0.7–4.0)
Lymphocytes Relative: 15.7 % (ref 12.0–46.0)
MCHC: 34.1 g/dL (ref 30.0–36.0)
MCV: 104.4 fl — ABNORMAL HIGH (ref 78.0–100.0)
MONO ABS: 1.3 10*3/uL — AB (ref 0.1–1.0)
Monocytes Relative: 13.8 % — ABNORMAL HIGH (ref 3.0–12.0)
NEUTROS ABS: 6.4 10*3/uL (ref 1.4–7.7)
NEUTROS PCT: 66.3 % (ref 43.0–77.0)
PLATELETS: 176 10*3/uL (ref 150.0–400.0)
RBC: 4.01 Mil/uL — AB (ref 4.22–5.81)
RDW: 13.5 % (ref 11.5–15.5)
WBC: 9.7 10*3/uL (ref 4.0–10.5)

## 2016-03-26 LAB — VITAMIN B12: VITAMIN B 12: 769 pg/mL (ref 211–911)

## 2016-03-26 LAB — SEDIMENTATION RATE: SED RATE: 3 mm/h (ref 0–20)

## 2016-03-26 LAB — TSH: TSH: 0.91 u[IU]/mL (ref 0.35–4.50)

## 2016-03-26 MED ORDER — ARIPIPRAZOLE 10 MG PO TABS: 10.0000 mg | ORAL_TABLET | Freq: Every day | ORAL | Status: DC

## 2016-03-26 NOTE — Progress Notes (Signed)
Pre visit review using our clinic review tool, if applicable. No additional management support is needed unless otherwise documented below in the visit note. 

## 2016-03-26 NOTE — Assessment & Plan Note (Signed)
Simvastatin, ASA, Toprol, Losartan

## 2016-03-26 NOTE — Assessment & Plan Note (Signed)
6/17 worse D/c'd Lexapro Start Abilify [place] person
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
## 2016-03-26 NOTE — Assessment & Plan Note (Signed)
Mild.

## 2016-03-26 NOTE — BH Assessment (Addendum)
Assessment Note  Samuel Santiago is a 82 y.o. male who presented voluntarily to [facility] as a walk-in, accompanied by his wife, [name] and adult son, Wille Glaser. [name] was the main historian during the assessment. Writer attempted to include pt in assessment, by seeking verification of what wife was reporting, but pt responded minimally, either nodding or providing a noncommittal answer, such as "I guess so" or "I don't know" or "I don't remember". [name] shared that pt has a hx of being "insanely jealous" of her, dating back to when they first were married decades ago. [name] indicated that pt would accuse her of sleeping around with other men and things of the sort. [name] denied that pt was ever physically aggressive towards her. She also denied pt ever cursing her or calling her names, but just angrily accusing her of things. [name] denied that pt has had any psychiatric hx. [name] shared that pt started on Lexapro (prescribed by family doctor) @ 6 months ago for his increasing anger and accusations and they have since gotten worse. [name] indicated that pt now gets angry at people on the TV (calling them SOBs) and has started to fabricate conversations that he and she had where she has admitted to being unfaithful to him. [name] indicated that she was interested in pursuing psychiatry for pt. Pt was also amenable to this.  Diagnosis: Unspecified neurocognitive disorder, provisional  Past Medical History:  Past Medical History  Diagnosis Date  . Hyperlipidemia   . Colon cancer ([place]) 1990    Had recurrence in 2003 resultinbg in chemotherapy. He recently had a colonoscopy and  had a polyp removed that was benign.  . Hypertension   . Ischemic heart disease     has had remote MI in 1997 and was treated with TPA. Prior PCI to RCA with repeat PCI to the RCA in 2003  . Kidney stone     x11  . Normal nuclear stress test Feb 2012    No significant ischemia. EF 56%; with basal inferior scar  . History of nephrolithiasis  01/23/2012  . Arthritis   . History of colon polyps   . GERD (gastroesophageal reflux disease)   . PAF (paroxysmal atrial fibrillation) ([place])   . Chronic anticoagulation   . Coronary artery disease   . Myocardial infarction ([place])   . Dysrhythmia   . CHF (congestive heart failure) ([place])   . Dementia     Past Surgical History  Procedure Laterality Date  . Colonoscopy      colon cancer  . Coronary stent placement  1997 & 2003    PCI to RCA x 2  . Colon surgery      x 2 for cancer  . Eye surgery      Family History:  Family History  Problem Relation Age of Onset  . Arthritis Sister   . Breast cancer Sister   . Colon polyps Son   . Colon polyps Daughter     Social History:  reports that he quit smoking about 30 years ago. He has never used smokeless tobacco. He reports that he does not drink alcohol or use illicit drugs.  Additional Social History:  Alcohol / Drug Use Pain Medications: see PTA meds Prescriptions: see PTA meds Over the Counter: see PTA meds History of alcohol / drug use?: No history of alcohol / drug abuse  CIWA:   COWS:    Allergies:  Allergies  Allergen Reactions  . Tape Other (See Comments)  Pulled off skin one time--- "it really sticks to me" but will probably tolerate paper tape     Home Medications:  (Not in a hospital admission)  OB/GYN Status:  No LMP for male patient.  General Assessment Data Location of Assessment: [facility] TTS Assessment: In system Is this a Tele or Face-to-Face Assessment?: Face-to-Face Is this an Initial Assessment or a Re-assessment for this encounter?: Initial Assessment Marital status: Married Is patient pregnant?: No Pregnancy Status: No Living Arrangements: Spouse/significant other (wife, Allison Corum) Can pt return to current living arrangement?: Yes Admission Status: Voluntary Is patient capable of signing voluntary admission?: Yes Referral Source: Self/Family/Friend Insurance type:  Best boy Exam ([place]) Medical Exam completed: No Reason for MSE not completed: Patient Refused  Crisis Care Plan Living Arrangements: Spouse/significant other (wife, Kyston Tomita) Name of Psychiatrist: none Name of Therapist: none  Education Status Is patient currently in school?: No  Risk to self with the past 6 months Suicidal Ideation: No Has patient been a risk to self within the past 6 months prior to admission? : No Suicidal Intent: No Has patient had any suicidal intent within the past 6 months prior to admission? : No Is patient at risk for suicide?: No Suicidal Plan?: No Has patient had any suicidal plan within the past 6 months prior to admission? : No Access to Means: No What has been your use of drugs/alcohol within the last 12 months?: pt denies Previous Attempts/Gestures: No How many times?: 0 Other Self Harm Risks: none Triggers for Past Attempts: Other (Comment) (no past attempts) Intentional Self Injurious Behavior: None Family Suicide History: No Recent stressful life event(s): Other (Comment) (possible early dementia stages) Persecutory voices/beliefs?: No Depression: No Depression Symptoms: Feeling angry/irritable Substance abuse history and/or treatment for substance abuse?: No Suicide prevention information given to non-admitted patients: Not applicable  Risk to Others within the past 6 months Homicidal Ideation: No Does patient have any lifetime risk of violence toward others beyond the six months prior to admission? : No Thoughts of Harm to Others: No Current Homicidal Intent: No Current Homicidal Plan: No Access to Homicidal Means: No History of harm to others?: No Assessment of Violence: None Noted Violent Behavior Description: none noted Does patient have access to weapons?: No Criminal Charges Pending?: No Does patient have a court date: No Is patient on probation?: No  Psychosis Hallucinations: None  noted Delusions: Jealous  Mental Status Report Appearance/Hygiene: Unremarkable Eye Contact: Fair Motor Activity: Unremarkable Speech: Logical/coherent Level of Consciousness: Alert Mood: Pleasant Affect: Appropriate to circumstance Anxiety Level: None Thought Processes: Coherent, Relevant Judgement: Unable to Assess Orientation: Person, Place, Time, Situation Obsessive Compulsive Thoughts/Behaviors: None  Cognitive Functioning Concentration: Normal Memory: Unable to Assess IQ: Average Insight: see judgement above Impulse Control: Good Appetite: Good Sleep: No Change Vegetative Symptoms: None  ADLScreening [facility] Assessment Services) Patient's cognitive ability adequate to safely complete daily activities?: Yes Patient able to express need for assistance with ADLs?: Yes Independently performs ADLs?: Yes (appropriate for developmental age)  Prior Inpatient Therapy Prior Inpatient Therapy: No  Prior Outpatient Therapy Prior Outpatient Therapy: No Does patient have an ACCT team?: No Does patient have Intensive In-House Services?  : No Does patient have [facility] services? : No Does patient have P4CC services?: No  ADL Screening (condition at time of admission) Patient's cognitive ability adequate to safely complete daily activities?: Yes Is the patient deaf or have difficulty hearing?: No Does the patient have difficulty seeing, even  when wearing glasses/contacts?: No Does the patient have difficulty concentrating, remembering, or making decisions?: Yes Patient able to express need for assistance with ADLs?: Yes Does the patient have difficulty dressing or bathing?: No Independently performs ADLs?: Yes (appropriate for developmental age) Does the patient have difficulty walking or climbing stairs?: No Weakness of Legs: None Weakness of Arms/Hands: None  Home Assistive Devices/Equipment Home Assistive Devices/Equipment: None  Therapy Consults (therapy consults require  a physician order) PT Evaluation Needed: No OT Evalulation Needed: No SLP Evaluation Needed: No Abuse/Neglect Assessment (Assessment to be complete while patient is alone) Physical Abuse: Denies Verbal Abuse: Denies Sexual Abuse: Denies Exploitation of patient/patient's resources: Denies Self-Neglect: Denies Values / Beliefs Cultural Requests During Hospitalization: None Spiritual Requests During Hospitalization: None Consults Spiritual Care Consult Needed: No Social Work Consult Needed: No Regulatory affairs officer (For Healthcare) Does patient have an advance directive?: No Would patient like information on creating an advanced directive?: No - patient declined information    Additional Information 1:1 In Past 12 Months?: No CIRT Risk: No Elopement Risk: No Does patient have medical clearance?: No     Disposition:  Disposition Initial Assessment Completed for this Encounter: Yes Disposition of Patient: Referred to (consulted with Agustina Caroli, PMHNP) Other disposition(s): Information only (outpatient psychiatry)  On Site Evaluation by:   Reviewed with Physician:    Rexene Edison 03/26/2016 4:59 PM

## 2016-03-26 NOTE — Patient Instructions (Addendum)
Start Abilify [facility] - ask for an Intake person

## 2016-04-11 ENCOUNTER — Ambulatory Visit (INDEPENDENT_AMBULATORY_CARE_PROVIDER_SITE_OTHER): Payer: Medicare HMO | Admitting: *Deleted

## 2016-04-11 DIAGNOSIS — I4891 Unspecified atrial fibrillation: Secondary | ICD-10-CM | POA: Diagnosis not present

## 2016-04-11 DIAGNOSIS — Z5181 Encounter for therapeutic drug level monitoring: Secondary | ICD-10-CM | POA: Diagnosis not present

## 2016-04-11 DIAGNOSIS — Z7901 Long term (current) use of anticoagulants: Secondary | ICD-10-CM

## 2016-04-11 LAB — POCT INR: INR: 3.7

## 2016-05-02 ENCOUNTER — Ambulatory Visit (INDEPENDENT_AMBULATORY_CARE_PROVIDER_SITE_OTHER): Payer: Medicare HMO

## 2016-05-02 DIAGNOSIS — Z7901 Long term (current) use of anticoagulants: Secondary | ICD-10-CM | POA: Diagnosis not present

## 2016-05-02 DIAGNOSIS — Z5181 Encounter for therapeutic drug level monitoring: Secondary | ICD-10-CM

## 2016-05-02 DIAGNOSIS — I4891 Unspecified atrial fibrillation: Secondary | ICD-10-CM

## 2016-05-02 LAB — POCT INR: INR: 3.7

## 2016-05-06 ENCOUNTER — Ambulatory Visit: Payer: Medicare HMO | Admitting: Internal Medicine

## 2016-05-08 ENCOUNTER — Observation Stay (HOSPITAL_COMMUNITY)
Admission: EM | Admit: 2016-05-08 | Discharge: 2016-05-09 | Disposition: A | Discharge status: Home / Self Care | Payer: Medicare HMO | Attending: Internal Medicine | Admitting: Internal Medicine

## 2016-05-08 ENCOUNTER — Encounter (HOSPITAL_COMMUNITY): Payer: Self-pay | Admitting: Emergency Medicine

## 2016-05-08 ENCOUNTER — Emergency Department (HOSPITAL_COMMUNITY): Payer: Medicare HMO

## 2016-05-08 DIAGNOSIS — Z7901 Long term (current) use of anticoagulants: Secondary | ICD-10-CM | POA: Diagnosis not present

## 2016-05-08 DIAGNOSIS — R0602 Shortness of breath: Principal | ICD-10-CM | POA: Insufficient documentation

## 2016-05-08 DIAGNOSIS — R69 Illness, unspecified: Secondary | ICD-10-CM | POA: Diagnosis not present

## 2016-05-08 DIAGNOSIS — I110 Hypertensive heart disease with heart failure: Secondary | ICD-10-CM | POA: Diagnosis not present

## 2016-05-08 DIAGNOSIS — Z955 Presence of coronary angioplasty implant and graft: Secondary | ICD-10-CM | POA: Insufficient documentation

## 2016-05-08 DIAGNOSIS — I252 Old myocardial infarction: Secondary | ICD-10-CM | POA: Insufficient documentation

## 2016-05-08 DIAGNOSIS — Z87891 Personal history of nicotine dependence: Secondary | ICD-10-CM | POA: Diagnosis not present

## 2016-05-08 DIAGNOSIS — R0600 Dyspnea, unspecified: Secondary | ICD-10-CM | POA: Diagnosis not present

## 2016-05-08 DIAGNOSIS — I480 Paroxysmal atrial fibrillation: Secondary | ICD-10-CM | POA: Diagnosis not present

## 2016-05-08 DIAGNOSIS — F0390 Unspecified dementia without behavioral disturbance: Secondary | ICD-10-CM | POA: Diagnosis not present

## 2016-05-08 DIAGNOSIS — R531 Weakness: Secondary | ICD-10-CM | POA: Diagnosis not present

## 2016-05-08 DIAGNOSIS — Z7982 Long term (current) use of aspirin: Secondary | ICD-10-CM | POA: Insufficient documentation

## 2016-05-08 DIAGNOSIS — R918 Other nonspecific abnormal finding of lung field: Secondary | ICD-10-CM | POA: Diagnosis present

## 2016-05-08 DIAGNOSIS — R0902 Hypoxemia: Secondary | ICD-10-CM | POA: Diagnosis not present

## 2016-05-08 DIAGNOSIS — Z85038 Personal history of other malignant neoplasm of large intestine: Secondary | ICD-10-CM | POA: Insufficient documentation

## 2016-05-08 DIAGNOSIS — I509 Heart failure, unspecified: Secondary | ICD-10-CM | POA: Diagnosis not present

## 2016-05-08 DIAGNOSIS — E785 Hyperlipidemia, unspecified: Secondary | ICD-10-CM

## 2016-05-08 DIAGNOSIS — I10 Essential (primary) hypertension: Secondary | ICD-10-CM

## 2016-05-08 DIAGNOSIS — I2510 Atherosclerotic heart disease of native coronary artery without angina pectoris: Secondary | ICD-10-CM | POA: Diagnosis not present

## 2016-05-08 DIAGNOSIS — I4891 Unspecified atrial fibrillation: Secondary | ICD-10-CM | POA: Diagnosis present

## 2016-05-08 LAB — BASIC METABOLIC PANEL
Anion gap: 4 — ABNORMAL LOW (ref 5–15)
BUN: 27 mg/dL — ABNORMAL HIGH (ref 6–20)
CO2: 30 mmol/L (ref 22–32)
Calcium: 9.8 mg/dL (ref 8.9–10.3)
Chloride: 105 mmol/L (ref 101–111)
Creatinine, Ser: 1.22 mg/dL (ref 0.61–1.24)
GFR calc Af Amer: >60 mL/min (ref >60)
GFR calc non Af Amer: 53 mL/min — ABNORMAL LOW (ref >60)
Glucose, Bld: 103 mg/dL — ABNORMAL HIGH (ref 65–99)
Potassium: 4.6 mmol/L (ref 3.5–5.1)
Sodium: 139 mmol/L (ref 135–145)

## 2016-05-08 LAB — CBC WITH DIFFERENTIAL/PLATELET
Basophils Absolute: 0.1 10*3/uL (ref 0.0–0.1)
Basophils Relative: 1 %
Eosinophils Absolute: 0.4 10*3/uL (ref 0.0–0.7)
Eosinophils Relative: 4 %
HCT: 39.2 % (ref 39.0–52.0)
Hemoglobin: 14 g/dL (ref 13.0–17.0)
Lymphocytes Relative: 16 %
Lymphs Abs: 1.5 10*3/uL (ref 0.7–4.0)
MCH: 36.2 pg — ABNORMAL HIGH (ref 26.0–34.0)
MCHC: 35.7 g/dL (ref 30.0–36.0)
MCV: 101.3 fL — ABNORMAL HIGH (ref 78.0–100.0)
Monocytes Absolute: 1 10*3/uL (ref 0.1–1.0)
Monocytes Relative: 11 %
Neutro Abs: 6.5 10*3/uL (ref 1.7–7.7)
Neutrophils Relative %: 68 %
Platelets: 163 10*3/uL (ref 150–400)
RBC: 3.87 MIL/uL — ABNORMAL LOW (ref 4.22–5.81)
RDW: 12.9 % (ref 11.5–15.5)
WBC: 9.4 10*3/uL (ref 4.0–10.5)

## 2016-05-08 LAB — LIPID PANEL
CHOL/HDL RATIO: 3.8 ratio
CHOLESTEROL: 148 mg/dL (ref 0–200)
HDL: 39 mg/dL — ABNORMAL LOW (ref >40)
LDL Cholesterol: 85 mg/dL (ref 0–99)
Triglycerides: 121 mg/dL (ref <150)
VLDL: 24 mg/dL (ref 0–40)

## 2016-05-08 LAB — PROTIME-INR
INR: 2.47 — ABNORMAL HIGH (ref 0.00–1.49)
PROTHROMBIN TIME: 26.5 s — AB (ref 11.6–15.2)

## 2016-05-08 LAB — I-STAT TROPONIN, ED: Troponin i, poc: 0 ng/mL (ref 0.00–0.08)

## 2016-05-08 LAB — BRAIN NATRIURETIC PEPTIDE: B Natriuretic Peptide: 71.5 pg/mL (ref 0.0–100.0)

## 2016-05-08 LAB — D-DIMER, QUANTITATIVE: D-Dimer, Quant: 1.14 ug/mL-FEU — ABNORMAL HIGH (ref 0.00–0.50)

## 2016-05-08 LAB — TROPONIN I: Troponin I: <0.03 ng/mL (ref <0.03)

## 2016-05-08 MED ORDER — PANTOPRAZOLE SODIUM 40 MG PO TBEC
40.0000 mg | DELAYED_RELEASE_TABLET | Freq: Every day | ORAL | Status: DC
  Administered 2016-05-09: 40 mg via ORAL
  Filled 2016-05-08: qty 1

## 2016-05-08 MED ORDER — ARIPIPRAZOLE 5 MG PO TABS
5.0000 mg | ORAL_TABLET | Freq: Every day | ORAL | Status: DC
  Administered 2016-05-08: 5 mg via ORAL
  Filled 2016-05-08 (×2): qty 1

## 2016-05-08 MED ORDER — FUROSEMIDE 10 MG/ML IJ SOLN
40.0000 mg | Freq: Every day | INTRAMUSCULAR | Status: DC
  Administered 2016-05-09: 40 mg via INTRAVENOUS
  Filled 2016-05-08: qty 4

## 2016-05-08 MED ORDER — LOSARTAN POTASSIUM 50 MG PO TABS
100.0000 mg | ORAL_TABLET | Freq: Every day | ORAL | Status: DC
Start: 2016-05-09 — End: 2016-05-09
  Administered 2016-05-09: 100 mg via ORAL
  Filled 2016-05-08: qty 2

## 2016-05-08 MED ORDER — IOPAMIDOL (ISOVUE-370) INJECTION 76%
INTRAVENOUS | Status: AC
  Administered 2016-05-09: 100 mL
  Filled 2016-05-08: qty 100

## 2016-05-08 MED ORDER — POLYVINYL ALCOHOL-POVIDONE 1.4-0.6 % OP SOLN: 1.0000 [drp] | Freq: Four times a day (QID) | OPHTHALMIC | Status: DC | PRN

## 2016-05-08 MED ORDER — WARFARIN - PHARMACIST DOSING INPATIENT: Freq: Every day | Status: DC

## 2016-05-08 MED ORDER — ADULT MULTIVITAMIN W/MINERALS CH
1.0000 | ORAL_TABLET | Freq: Every day | ORAL | Status: DC
  Administered 2016-05-09: 1 via ORAL
  Filled 2016-05-08: qty 1

## 2016-05-08 MED ORDER — POLYVINYL ALCOHOL 1.4 % OP SOLN
1.0000 [drp] | Freq: Four times a day (QID) | OPHTHALMIC | Status: DC | PRN
  Filled 2016-05-08: qty 15

## 2016-05-08 MED ORDER — SODIUM CHLORIDE 0.9% FLUSH: 3.0000 mL | INTRAVENOUS | Status: DC | PRN

## 2016-05-08 MED ORDER — METOPROLOL TARTRATE 25 MG PO TABS
25.0000 mg | ORAL_TABLET | Freq: Two times a day (BID) | ORAL | Status: DC
  Administered 2016-05-08 – 2016-05-09 (×2): 25 mg via ORAL
  Filled 2016-05-08 (×2): qty 1

## 2016-05-08 MED ORDER — ASPIRIN 81 MG PO CHEW
81.0000 mg | CHEWABLE_TABLET | Freq: Every day | ORAL | Status: DC
  Administered 2016-05-09: 81 mg via ORAL
  Filled 2016-05-08: qty 1

## 2016-05-08 MED ORDER — ACETAMINOPHEN 500 MG PO TABS: 500.0000 mg | ORAL_TABLET | Freq: Four times a day (QID) | ORAL | Status: DC | PRN

## 2016-05-08 MED ORDER — SODIUM CHLORIDE 0.9 % IV SOLN: 250.0000 mL | INTRAVENOUS | Status: DC | PRN

## 2016-05-08 MED ORDER — ATORVASTATIN CALCIUM 10 MG PO TABS: 10.0000 mg | ORAL_TABLET | Freq: Every day | ORAL | Status: DC

## 2016-05-08 MED ORDER — ONDANSETRON HCL 4 MG/2ML IJ SOLN: 4.0000 mg | Freq: Four times a day (QID) | INTRAMUSCULAR | Status: DC | PRN

## 2016-05-08 MED ORDER — ALBUTEROL SULFATE (2.5 MG/3ML) 0.083% IN NEBU
5.0000 mg | INHALATION_SOLUTION | Freq: Once | RESPIRATORY_TRACT | Status: AC
  Administered 2016-05-08: 5 mg via RESPIRATORY_TRACT
  Filled 2016-05-08: qty 6

## 2016-05-08 MED ORDER — ACETAMINOPHEN 325 MG PO TABS: 650.0000 mg | ORAL_TABLET | ORAL | Status: DC | PRN

## 2016-05-08 MED ORDER — WARFARIN SODIUM 1 MG PO TABS
1.5000 mg | ORAL_TABLET | Freq: Once | ORAL | Status: DC
  Filled 2016-05-08: qty 1

## 2016-05-08 MED ORDER — SODIUM CHLORIDE 0.9% FLUSH
3.0000 mL | Freq: Two times a day (BID) | INTRAVENOUS | Status: DC
  Administered 2016-05-08 – 2016-05-09 (×2): 3 mL via INTRAVENOUS

## 2016-05-08 NOTE — H&P (Signed)
History and Physical        Hospital Admission Note Date: 05/08/2016  Patient name: Samuel Santiago [id number] Medical record number: [id number] Date of birth: August 22, 1929 Age: 82 y.o. Gender: male  PCP: Walker Kehr, MD   Referring physician: Jaquita Folds, [facility]  Patient coming from: Home   Chief Complaint:  Shortness of breath with weakness, worsening in the last 2 weeks  HPI: Patient is a 82 year old male with CAD, atrial fibrillation on Coumadin, hypertension, hyperlipidemia, prior history of colon cancer, GERD, mild dementia, diastolic CHF presented to ED with worsening shortness of breath and generalized weakness. History was obtained from the patient and his wife at the bedside. Patient's wife reported that she had noticed him getting more and more short of breath in the last 2-3 weeks, he gained 10 pounds, having difficulty lying flat in bed so he was mostly sitting up on the edge of the chair to catch breath. He denied any fevers or chills, coughing any wheezing or productive phlegm. Patient's wife reported that he was at 190 pounds (which is more than 10 pounds heavier than his dry weight, usually around 170lbs), so he started eating less. However he continued to feel shortness of breath, peripheral edema, abdominal distention. Per wife today he was very short of breath and hence brought to ED. Patient denied any focal weakness. ED course: Chest x-ray showed no pneumonia, BNP 71, troponin 0, no leukocytosis, and creatinine 1.2, CBC unremarkable, EKG showed no acute ischemic changes. Patient was ambulated in the hallway and felt very weak, short of breath, hence hospitalist service was requested for admission for observation.   Review of Systems: Positives marked in 'bold' Constitutional: Denies fever, chills, diaphoresis, poor appetite and fatigue.  HEENT: Denies photophobia, eye  pain, redness, hearing loss, ear pain, congestion, sore throat, rhinorrhea, sneezing, mouth sores, trouble swallowing, neck pain, neck stiffness and tinnitus.   Respiratory: + SOB, DOE, denied cough, chest tightness,  and wheezing.   Cardiovascular: Denies chest pain, palpitations and + leg swelling.  Gastrointestinal: Denies nausea, vomiting, abdominal pain, diarrhea, constipation, blood in stool. + abdominal distention.  Genitourinary: Denies dysuria, urgency, frequency, hematuria, flank pain and difficulty urinating.  Musculoskeletal: Denies myalgias, back pain, joint swelling, arthralgias and gait problem.  Skin: Denies pallor, rash and wound.  Neurological: Denies dizziness, seizures, syncope, light-headedness, numbness and headaches. + generalized weakness Hematological: Denies adenopathy. Easy bruising, personal or family bleeding history  Psychiatric/Behavioral: Denies suicidal ideation, mood changes, confusion, nervousness, sleep disturbance and agitation  Past Medical History: Past Medical History  Diagnosis Date  . Hyperlipidemia   . Colon cancer ([place]) 1990    Had recurrence in 2003 resultinbg in chemotherapy. He recently had a colonoscopy and  had a polyp removed that was benign.  . Hypertension   . Ischemic heart disease     has had remote MI in 1997 and was treated with TPA. Prior PCI to RCA with repeat PCI to the RCA in 2003  . Kidney stone     x11  . Normal nuclear stress test Feb 2012    No significant ischemia. EF 56%; with basal inferior scar  . History of nephrolithiasis 01/23/2012  .  Arthritis   . History of colon polyps   . GERD (gastroesophageal reflux disease)   . PAF (paroxysmal atrial fibrillation) ([place])   . Chronic anticoagulation   . Coronary artery disease   . Myocardial infarction ([place])   . Dysrhythmia   . CHF (congestive heart failure) ([place])   . Dementia     Past Surgical History  Procedure Laterality Date  . Colonoscopy      colon cancer  .  Coronary stent placement  1997 & 2003    PCI to RCA x 2  . Colon surgery      x 2 for cancer  . Eye surgery      Medications: Prior to Admission medications   Medication Sig Start Date End Date Taking? Authorizing Provider  acetaminophen (TYLENOL) 500 MG tablet Take 500 mg by mouth every 6 (six) hours as needed (pain).   Yes Historical Provider, MD  ARIPiprazole (ABILIFY) 10 MG tablet Take 1 tablet (10 mg total) by mouth daily. Patient taking differently: Take 5 mg by mouth daily.  03/26/16  Yes Evie Lacks Plotnikov, MD  aspirin 81 MG chewable tablet Chew 1 tablet (81 mg total) by mouth daily. 11/20/15  Yes Shanker Kristeen Mans, MD  atorvastatin (LIPITOR) 10 MG tablet Take 1 tablet (10 mg total) by mouth daily. 12/12/15  Yes Burtis Junes, NP  Cholecalciferol (VITAMIN D-3 PO) Take 1 tablet by mouth daily.    Yes Historical Provider, MD  GLUCOSAMINE-CHONDROITIN PO Take 1 tablet by mouth daily.   Yes Historical Provider, MD  hydrochlorothiazide (HYDRODIURIL) 25 MG tablet Take 25 mg by mouth daily.   Yes Historical Provider, MD  losartan (COZAAR) 100 MG tablet Take 1 tablet (100 mg total) by mouth daily. 10/08/15  Yes Burtis Junes, NP  metoprolol tartrate (LOPRESSOR) 25 MG tablet TAKE 1 TABLET BY MOUTH TWICE DAILY 01/16/16  Yes Larey Dresser, MD  Multiple Vitamin (MULTIVITAMIN WITH MINERALS) TABS tablet Take 1 tablet by mouth daily.   Yes Historical Provider, MD  Omega-3 Fatty Acids (FISH OIL PO) Take 1 capsule by mouth daily.   Yes Historical Provider, MD  omeprazole (PRILOSEC) 20 MG capsule Take 20 mg by mouth daily.   Yes Historical Provider, MD  polyvinyl alcohol-povidone (REFRESH) 1.4-0.6 % ophthalmic solution Place 1 drop into both eyes 4 (four) times daily as needed (dry eyes).    Yes Historical Provider, MD  warfarin (COUMADIN) 1 MG tablet Take 1- 1 1/2 tablets daily as directed by coumadin clinic Patient taking differently: Take 1-1.5 mg by mouth daily. Takes 1mg  on Monday and Friday and  1.5mg  all other days 01/30/16  Yes Larey Dresser, MD  donepezil (ARICEPT) 5 MG tablet Take 1 tablet (5 mg total) by mouth at bedtime. Patient not taking: Reported on 05/08/2016 09/27/15   Cassandria Anger, MD    Allergies:   Allergies  Allergen Reactions  . Tape Other (See Comments)    Pulled off skin one time--- "it really sticks to me" but will probably tolerate paper tape     Social History:  reports that he quit smoking about 30 years ago. He has never used smokeless tobacco. He reports that he does not drink alcohol or use illicit drugs.Currently lives at home with his wife, usually ambulates with a cane.  Family History: Family History  Problem Relation Age of Onset  . Arthritis Sister   . Breast cancer Sister   . Colon polyps Son   . Colon polyps Daughter  Physical Exam: Blood pressure 121/76, pulse 61, temperature 97.5 F (36.4 C), temperature source Oral, resp. rate 21, height 5\' 6"  (1.676 m), SpO2 97 %. General: Alert, awake, oriented x3, in no acute distress. HEENT: normocephalic, atraumatic, anicteric sclera, pink conjunctiva, pupils equal and reactive to light and accomodation, oropharynx clear Neck: supple, no masses or lymphadenopathy, no goiter, no bruits  Heart: Regular rate and rhythm, without murmurs, rubs or gallops. Lungs: Decreased breath sounds at the bases Abdomen: Soft, nontender, nondistended, positive bowel sounds, no masses. Extremities: No clubbing, cyanosis, 1+ peripheral edema with positive pedal pulses. Neuro: Grossly intact, no focal neurological deficits, strength 5/5 upper and lower extremities bilaterally Psych: alert and oriented x 3, normal mood and affect Skin: no rashes or lesions, warm and dry   LABS on Admission:  Basic Metabolic Panel:  Recent Labs Lab 05/08/16 1203  NA 139  K 4.6  CL 105  CO2 30  GLUCOSE 103*  BUN 27*  CREATININE 1.22  CALCIUM 9.8   Liver Function Tests: No results for input(s): AST, ALT,  ALKPHOS, BILITOT, PROT, ALBUMIN in the last 168 hours. No results for input(s): LIPASE, AMYLASE in the last 168 hours. No results for input(s): AMMONIA in the last 168 hours. CBC:  Recent Labs Lab 05/08/16 1203  WBC 9.4  NEUTROABS 6.5  HGB 14.0  HCT 39.2  MCV 101.3*  PLT 163   Cardiac Enzymes: No results for input(s): CKTOTAL, CKMB, CKMBINDEX, TROPONINI in the last 168 hours. BNP: Invalid input(s): POCBNP CBG: No results for input(s): GLUCAP in the last 168 hours.  Radiological Exams on Admission:  Dg Chest 2 View  05/08/2016  CLINICAL DATA:  Shortness of breath for 3 days EXAM: CHEST  2 VIEW COMPARISON:  November 19, 2015 FINDINGS: There is no edema or consolidation. Heart is upper normal in size with pulmonary vascularity within normal limits. No adenopathy. There are foci of calcification in the aorta. Bones are osteoporotic. There is degenerative change in the left shoulder. IMPRESSION: No edema or consolidation. Stable cardiac silhouette. Aortic atherosclerosis. Bones osteoporotic. Electronically Signed   By: Lowella Grip III M.D.   On: 05/08/2016 11:15    *I have personally reviewed the images above*  EKG: Independently reviewed. Normal sinus rhythm, no acute ST-T wave changes congestive of ischemia   Assessment/Plan  Dyspnea likely secondary to acute on chronic diastolic CHF, prior echo [id number] 2016 showed EF of 50-55% with grade 1 diastolic dysfunction. INR therapeutic 2.47, hence PE unlikely, chest x-ray showed no pneumonia or consolidation. No wheezing on exam. - Admit to telemetry, obtain serial cardiac enzymes, d-dimer, TSH -  Placed on Lasix 40 mg IV daily, continue beta blocker, ACEI, hold HCTZ - Obtain 2-D echocardiogram, cardiology consult    Dyslipidemia - Obtain lipid panel, continue statin    Essential hypertension - Currently stable, continue metoprolol, losartan, adding Lasix    Atrial fibrillation (HCC) - Currently rate controlled, continue  metoprolol - CHADSVASC >4, continue warfarin    Dementia - Continue Abilify    Generalized Weakness - PTOT evaluation, on exam no focal weakness  DVT prophylaxis: Warfarin  CODE STATUS: Discussed in detail with the patient, he opted to be full CODE STATUS. Wife agrees.    Consults called: Cardiology, d/w PA, Ria Comment  Family Communication: Admission, patients condition and plan of care including tests being ordered have been discussed with the patient and wife who indicates understanding and agree with the plan and Code Status  Admission status: obs tele  Disposition  plan: Further plan will depend as patient's clinical course evolves and further radiologic and laboratory data become available. Likely home when stable  Time Spent on Admission: 60 minute  Mccormick Macon M.D. [facility] 05/08/2016, 4:25 PM Pager: [phone]  If 7PM-7AM, please contact night-coverage [email or web address] Password [email or web address]

## 2016-05-08 NOTE — ED Notes (Addendum)
Pt ambulated in the hall with assistance, denied shortness of breath oxygen 100% on room air, patient reports feeling more weak than normal, er md notifed

## 2016-05-08 NOTE — Consult Note (Signed)
Cardiology Consult    Patient ID: Samuel Santiago MRN: [id number], DOB/AGE: 1928/11/05   Admit date: 05/08/2016 Date of Consult: 05/08/2016  Primary Physician: Walker Kehr, MD Primary Cardiologist: Dr.McLean Requesting Provider: Dr. Tana Coast Reason for Consultation: Dyspnea  Patient Profile    82 yo male with PMH of HL/Colon CA/HTN/CAD s/p PCI RCA 2003/PAF on coumadin and dementia who presented to the [facility] ED with reports of dyspnea and weakness.   Past Medical History   Past Medical History  Diagnosis Date  . Hyperlipidemia   . Colon cancer ([place]) 1990    Had recurrence in 2003 resultinbg in chemotherapy. He recently had a colonoscopy and  had a polyp removed that was benign.  . Hypertension   . Ischemic heart disease     has had remote MI in 1997 and was treated with TPA. Prior PCI to RCA with repeat PCI to the RCA in 2003  . Kidney stone     x11  . Normal nuclear stress test Feb 2012    No significant ischemia. EF 56%; with basal inferior scar  . History of nephrolithiasis 01/23/2012  . Arthritis   . History of colon polyps   . GERD (gastroesophageal reflux disease)   . PAF (paroxysmal atrial fibrillation) ([place])   . Chronic anticoagulation   . Coronary artery disease   . Myocardial infarction ([place])   . Dysrhythmia   . CHF (congestive heart failure) ([place])     EF 50-55% 2016  . Dementia     Past Surgical History  Procedure Laterality Date  . Colonoscopy      colon cancer  . Coronary stent placement  1997 & 2003    PCI to RCA x 2  . Colon surgery      x 2 for cancer  . Eye surgery       Allergies  Allergies  Allergen Reactions  . Tape Other (See Comments)    Pulled off skin one time--- "it really sticks to me" but will probably tolerate paper tape     History of Present Illness    [name] is An 82 year old male with past medical history of L/Colon CA/HTN/CAD s/p PCI RCA 2003/PAF on coumadin and dementia. He is followed regularly by Truitt Merle, and is a former patient of Dr. Susa Simmonds. He has history of known CAD with remote MI treated with thrombolysis in 1997, followed by 2 stents to the RCA in 2003. Last Myoview in any 12 was normal, and has been medically managed since. He has a history of colon CA from 41 with a colectomy and recurrence of 2003 that was treated with chemotherapy. He was last admitted in November 2016 with hypertension and found to be in atrial fibrillation, with a chads score of 4. He spontaneously converted at that time and was placed on metoprolol and Xarelto. Cost played a factor in relation to his anticoagulation and he was ultimately switched to Coumadin. At that visit his BP was up so his losartan was increased, and was seen again in January 2017 where HCTZ was added back. He was admitted earlier this year with questionable TIA. At that time his Coumadin was subtherapeutic and appeared labile, but he could not afford NOAC therapy. He is currently on aspirin along with Coumadin. Last echo from no murmur 2016 showed an EF of 50-55%, carotid Dopplers were without significant stenosis. At his last visit with [facility] Merle he reported doing well and tolerating his medicines okay.  He presented  to the [facility] ED with reports of increased weakness, and shortness of breath over the past couple days. Wife reports that he is normally somewhat active around the home, ambulating with a cane for the most part. Reports over the past couple of days his weakness has gotten worse, and that he is complaining more of shortness of breath. Wife also states his weight has increased by about 10lbs over the past couple of weeks, with normal weight around 170lbs. Also reports fullness in the abd. States that he is unable to lie flat for short period of time, or even sit in a somewhat reclined position without feeling short of breath. He states that its if he just can't catch his breath. Has any chest pain, dizziness, lightheadedness,  palpitations, nausea, vomiting or lower extremity edema.  In the ED his labs showed a creatinine of 1.2, BNP 71, POC troponin negative, hemoglobin 14, and INR 2.4. X-ray was without infiltrate or edema. EKG showed sinus rhythm with nonspecific T-wave changes. He was ambulated in the hallway with assistance, and denied shortness of breath with a room air oxygen saturation 100%, but reported feeling more weaker than normal. General medicine was called for admission and further management.  Inpatient Medications      Family History    Family History  Problem Relation Age of Onset  . Arthritis Sister   . Breast cancer Sister   . Colon polyps Son   . Colon polyps Daughter     Social History    Social History   Social History  . Marital Status: Married    Spouse Name: N/A  . Number of Children: 3  . Years of Education: N/A   Occupational History  . retired    Social History Main Topics  . Smoking status: Former Smoker    Quit date: 10/20/1985  . Smokeless tobacco: Never Used  . Alcohol Use: No  . Drug Use: No  . Sexual Activity: Not on file   Other Topics Concern  . Not on file   Social History Narrative     Review of Systems    General:  No chills, fever, night sweats or weight changes.  Cardiovascular:  See HPI Dermatological: No rash, lesions/masses Respiratory: See HPI Urologic: No hematuria, dysuria Abdominal:   No nausea, vomiting, diarrhea, bright red blood per rectum, melena, or hematemesis Neurologic:  No visual changes, wkns, changes in mental status. All other systems reviewed and are otherwise negative except as noted above.  Physical Exam    Blood pressure 121/76, pulse 61, temperature 97.5 F (36.4 C), temperature source Oral, resp. rate 21, height 5\' 6"  (1.676 m), SpO2 97 %.  General: Pleasant older, well nourished male, NAD Psych: Normal affect. Neuro: Alert and oriented X 3. Moves all extremities spontaneously. HEENT: Normal  Neck: Supple  without bruits or JVD. Lungs:  Resp regular and unlabored, Diminished in lower lobes. Heart: RRR no s3, s4, or murmurs. Abdomen: Soft, non-tender, slightly distended, BS + x 4.  Extremities: No clubbing, cyanosis or edema. DP/PT/Radials 2+ and equal bilaterally.  Labs    Troponin [facility] Test)  Recent Labs  05/08/16 1240  TROPIPOC 0.00   No results for input(s): CKTOTAL, CKMB, TROPONINI in the last 72 hours. Lab Results  Component Value Date   WBC 9.4 05/08/2016   HGB 14.0 05/08/2016   HCT 39.2 05/08/2016   MCV 101.3* 05/08/2016   PLT 163 05/08/2016     Recent Labs Lab 05/08/16 1203  NA 139  K 4.6  CL 105  CO2 30  BUN 27*  CREATININE 1.22  CALCIUM 9.8  GLUCOSE 103*   Lab Results  Component Value Date   CHOL 156 01/30/2016   HDL 47 01/30/2016   LDLCALC 75 01/30/2016   TRIG 170* 01/30/2016   No results found for: [facility]   Radiology Studies    Dg Chest 2 View  05/08/2016  CLINICAL DATA:  Shortness of breath for 3 days EXAM: CHEST  2 VIEW COMPARISON:  November 19, 2015 FINDINGS: There is no edema or consolidation. Heart is upper normal in size with pulmonary vascularity within normal limits. No adenopathy. There are foci of calcification in the aorta. Bones are osteoporotic. There is degenerative change in the left shoulder. IMPRESSION: No edema or consolidation. Stable cardiac silhouette. Aortic atherosclerosis. Bones osteoporotic. Electronically Signed   By: Lowella Grip III M.D.   On: 05/08/2016 11:15    ECG & Cardiac Imaging    EKG: SR without acute ST/T wave abnormalities  Echo: 08/2015  Study Conclusions  - Left ventricle: The cavity size was normal. Wall thickness was  normal. Systolic function was normal. The estimated ejection  fraction was in the range of 50% to 55%. Wall motion was normal;  there were no regional wall motion abnormalities. Doppler  parameters are consistent with abnormal left ventricular  relaxation (grade 1  diastolic dysfunction).  Assessment & Plan    Mr. Swanger is An 82 year old male with past medical history of HL/Colon CA/HTN/CAD s/p PCI RCA 2003/PAF on coumadin and dementia. He is followed regularly by [facility] Merle, and is a former patient of Dr. Susa Simmonds. He has history of known CAD with remote MI treated with thrombolysis in 1997, followed by 2 stents to the RCA in 2003. Last Myoview in any 12 was normal, and has been medically managed since. He has a history of colon CA from 21 with a colectomy and recurrence of 2003 that was treated with chemotherapy. He was last admitted in November 2016 with hypertension and found to be in atrial fibrillation, with a chads score of 4. He spontaneously converted at that time and was placed on metoprolol and Xarelto. [place] played a factor in relation to his anticoagulation and he was ultimately switched to Coumadin. At that visit his BP was up so his losartan was increased, and was seen again in January 2017 where HCTZ was added back. He was admitted earlier this year with questionable TIA. At that time his Coumadin was subtherapeutic and appeared labile, but he could not afford NOAC therapy. He is currently on aspirin along with Coumadin. Last echo from no murmur 2016 showed an EF of 50-55%, carotid Dopplers were without significant stenosis. At his last visit with [facility] Merle he reported doing well and tolerating his medicines okay.  1. Dyspnea: He and his wife report a progressive weakness over the past couple of weeks, but worse in the past couple of days. Wife states Mr. Skalicky is normally ambulatory around the home with a cane, but has progressively weaker. Also reports increased dyspnea at rest, and with activity. Positive for orthopnea and PND, stating that he is unable to lie supine or in a semi reclined position without feeling dyspneic. In the ED his BNP was normal, POC trop neg, chest x-ray negative, and EKG showed SR with nonspecific T wave changes. He  does not appear fluid overloaded on physical exam.  --2 D echo 11/16 showed an EF of 50-55% and G1DD, denies  being on lasix at home, but takes HCTZ. Wife reports a 10lb weight gain in the past couple of weeks, reporting normal weight is around 170lbs. In review of previous office visits weights range from 179-184.  -- Will repeat to 2D echo to further assess EF. Given the reports of SOB, and difficulty getting his breath, D-dimer is reasonable for rule out PE. Also would check TSH  2. PAF: In NSR today. Therapeutic INR on coumadin today.  -- Continue  Metoprolol and Coumadin -- This patients CHA2DS2-VASc Score and unadjusted Ischemic Stroke Rate (% per year) is equal to 9.7 % stroke rate/year from a score of 6 Above score calculated as 1 point each if present [CHF, HTN, DM, Vascular=MI/PAD/Aortic Plaque, Age if 65-74, or Male] Above score calculated as 2 points each if present [Age > 75, or Stroke/TIA/TE]  3. HTN: Controlled  4. HL: On statin    Weston Brass Reino Bellis, NP-C Pager [phone] 05/08/2016, 5:27 PM  Patient examined chart reviewed. Exam remarkable for injected sclera, obesity clear lungs and no edema Or heart murmur. Doubt weakness and dyspnea related to heart. F/u echo for EF. CXR clear BNP normal D dimer and TSH pending Discussed with wife and patient. To be admitted by medicine don't suspect Anything but echo will be needed as inpatient  Jenkins Rouge

## 2016-05-08 NOTE — ED Provider Notes (Signed)
CSN: [id number]     Arrival date & time 05/08/16  1029 History   First MD Initiated Contact with Patient 05/08/16 1043     Chief Complaint  Patient presents with  . Shortness of Breath     (Consider location/radiation/quality/duration/timing/severity/associated sxs/prior Treatment) HPI Samuel Santiago is a 82 y.o. male with history of coronary artery disease, CHF, A. fib with RVR, here for evaluation of shortness of breath. Patient is accompanied by wife who contributes to history of present illness. They report over the past 3 weeks, patient has had shortness of breath. They report gradual worsening shortness of breath over the past 3 days. Patient states he doesn't feel as if he can catch his breath, "can't breathe deep enough". Patient reports he feels much better when he is standing in front of the fan. Denies fevers, chills, cough or hemoptysis, chest pain, leg swelling, abdominal pain, nausea or vomiting or diaphoresis. No other modifying factors.  Past Medical History  Diagnosis Date  . Hyperlipidemia   . Colon cancer ([place]) 1990    Had recurrence in 2003 resultinbg in chemotherapy. He recently had a colonoscopy and  had a polyp removed that was benign.  . Hypertension   . Ischemic heart disease     has had remote MI in 1997 and was treated with TPA. Prior PCI to RCA with repeat PCI to the RCA in 2003  . Kidney stone     x11  . Normal nuclear stress test Feb 2012    No significant ischemia. EF 56%; with basal inferior scar  . History of nephrolithiasis 01/23/2012  . Arthritis   . History of colon polyps   . GERD (gastroesophageal reflux disease)   . PAF (paroxysmal atrial fibrillation) ([place])   . Chronic anticoagulation   . Coronary artery disease   . Myocardial infarction ([place])   . Dysrhythmia   . CHF (congestive heart failure) ([place])   . Dementia    Past Surgical History  Procedure Laterality Date  . Colonoscopy      colon cancer  . Coronary stent placement  1997 & 2003     PCI to RCA x 2  . Colon surgery      x 2 for cancer  . Eye surgery     Family History  Problem Relation Age of Onset  . Arthritis Sister   . Breast cancer Sister   . Colon polyps Son   . Colon polyps Daughter    Social History  Substance Use Topics  . Smoking status: Former Smoker    Quit date: 10/20/1985  . Smokeless tobacco: Never Used  . Alcohol Use: No    Review of Systems A 10 point review of systems was completed and was negative except for pertinent positives and negatives as mentioned in the history of present illness     Allergies  Tape  Home Medications   Prior to Admission medications   Medication Sig Start Date End Date Taking? Authorizing Provider  acetaminophen (TYLENOL) 500 MG tablet Take 500 mg by mouth every 6 (six) hours as needed (pain).   Yes Historical Provider, MD  ARIPiprazole (ABILIFY) 10 MG tablet Take 1 tablet (10 mg total) by mouth daily. Patient taking differently: Take 5 mg by mouth daily.  03/26/16  Yes Evie Lacks Plotnikov, MD  aspirin 81 MG chewable tablet Chew 1 tablet (81 mg total) by mouth daily. 11/20/15  Yes Shanker Kristeen Mans, MD  atorvastatin (LIPITOR) 10 MG tablet Take 1 tablet (10  mg total) by mouth daily. 12/12/15  Yes Burtis Junes, NP  Cholecalciferol (VITAMIN D-3 PO) Take 1 tablet by mouth daily.    Yes Historical Provider, MD  GLUCOSAMINE-CHONDROITIN PO Take 1 tablet by mouth daily.   Yes Historical Provider, MD  hydrochlorothiazide (HYDRODIURIL) 25 MG tablet Take 25 mg by mouth daily.   Yes Historical Provider, MD  losartan (COZAAR) 100 MG tablet Take 1 tablet (100 mg total) by mouth daily. 10/08/15  Yes Burtis Junes, NP  metoprolol tartrate (LOPRESSOR) 25 MG tablet TAKE 1 TABLET BY MOUTH TWICE DAILY 01/16/16  Yes Larey Dresser, MD  Multiple Vitamin (MULTIVITAMIN WITH MINERALS) TABS tablet Take 1 tablet by mouth daily.   Yes Historical Provider, MD  Omega-3 Fatty Acids (FISH OIL PO) Take 1 capsule by mouth daily.   Yes  Historical Provider, MD  omeprazole (PRILOSEC) 20 MG capsule Take 20 mg by mouth daily.   Yes Historical Provider, MD  polyvinyl alcohol-povidone (REFRESH) 1.4-0.6 % ophthalmic solution Place 1 drop into both eyes 4 (four) times daily as needed (dry eyes).    Yes Historical Provider, MD  warfarin (COUMADIN) 1 MG tablet Take 1- 1 1/2 tablets daily as directed by coumadin clinic Patient taking differently: Take 1-1.5 mg by mouth daily. Takes 1mg  on Monday and Friday and 1.5mg  all other days 01/30/16  Yes Larey Dresser, MD  donepezil (ARICEPT) 5 MG tablet Take 1 tablet (5 mg total) by mouth at bedtime. Patient not taking: Reported on 05/08/2016 09/27/15   Evie Lacks Plotnikov, MD   BP 131/74 mmHg  Pulse 59  Temp(Src) 97.5 F (36.4 C) (Oral)  Resp 18  Ht 5\' 6"  (1.676 m)  SpO2 100% Physical Exam  Constitutional: He is oriented to person, place, and time. He appears well-developed and well-nourished. No distress.  Currently lying back in exam bed without any difficulty breathing.  HENT:  Head: Normocephalic and atraumatic.  Mouth/Throat: Oropharynx is clear and moist.  Eyes: Conjunctivae are normal. Pupils are equal, round, and reactive to light. Right eye exhibits no discharge. Left eye exhibits no discharge. No scleral icterus.  Neck: Normal range of motion. Neck supple. No JVD present.  Cardiovascular: Normal rate, regular rhythm and normal heart sounds.   Pulmonary/Chest: Effort normal and breath sounds normal. No respiratory distress. He has no wheezes.  Mild crackles in bilateral bases. No other obvious adventitious lung sounds  Abdominal: Soft. There is no tenderness.  Musculoskeletal: Normal range of motion. He exhibits no edema or tenderness.  Neurological: He is alert and oriented to person, place, and time.  Cranial Nerves II-XII grossly intact  Skin: Skin is warm and dry. No rash noted. He is not diaphoretic.  Psychiatric: He has a normal mood and affect.  Nursing note and vitals  reviewed.   ED Course  Procedures (including critical care time) Labs Review Labs Reviewed  BASIC METABOLIC PANEL - Abnormal; Notable for the following:    Glucose, Bld 103 (*)    BUN 27 (*)    GFR calc non Af Amer 53 (*)    Anion gap 4 (*)    All other components within normal limits  CBC WITH DIFFERENTIAL/PLATELET - Abnormal; Notable for the following:    RBC 3.87 (*)    MCV 101.3 (*)    MCH 36.2 (*)    All other components within normal limits  PROTIME-INR - Abnormal; Notable for the following:    Prothrombin Time 26.5 (*)    INR 2.47 (*)  All other components within normal limits  BRAIN NATRIURETIC PEPTIDE  I-STAT TROPOININ, ED  Randolm Idol, ED    Imaging Review Dg Chest 2 View  05/08/2016  CLINICAL DATA:  Shortness of breath for 3 days EXAM: CHEST  2 VIEW COMPARISON:  November 19, 2015 FINDINGS: There is no edema or consolidation. Heart is upper normal in size with pulmonary vascularity within normal limits. No adenopathy. There are foci of calcification in the aorta. Bones are osteoporotic. There is degenerative change in the left shoulder. IMPRESSION: No edema or consolidation. Stable cardiac silhouette. Aortic atherosclerosis. Bones osteoporotic. Electronically Signed   By: Lowella Grip III M.D.   On: 05/08/2016 11:15   I have personally reviewed and evaluated these images and lab results as part of my medical decision-making.   EKG Interpretation  Date/Time:  Thursday May 08 2016 11:34:57 EDT Ventricular Rate:  57 PR Interval:  150 QRS Duration: 103 QT Interval:  387 QTC Calculation: 377 R Axis:   -10 Text Interpretation:  Sinus rhythm Abnormal R-wave progression, early transition Borderline repolarization abnormality Confirmed by Lita Mains  MD, DAVID ([id number]) on 05/08/2016 3:45:44 PM       MDM  Patient with history of coronary artery disease, A. fib with RVR, here for evaluation of shortness of breath over the past 3 weeks, worse with the past 3  days. Vital signs are stable and he is afebrile. No evidence of gross fluid overload on exam. Chest x-ray shows no fluid or consolidation. EKG is reassuring and Initial troponin is negative at 0.00, INR is therapeutic at 2.47. BNP 71 Patient attempted to ambulate in the hall, oxygen saturations were okay, but patient was incredibly weak and needed the assistance of 2 nurses in order to ambulate. Wife at bedside says this is highly unusual. Patient does live at home alone with his wife who is also a very petite lady and will not be able to help patient ambulate. Discussed with my attending, Dr. Lita Mains, who also saw and evaluated patient. I believe he would benefit from admission for overnight observation and further evaluation of his weakness. Discussed with hospitalist, Dr. Tana Coast, plan for medical admission. Final diagnoses:  Weakness  SOB (shortness of breath)        Comer Locket, PA-C 05/13/16 0600  Julianne Rice, MD 05/25/16 [phone]

## 2016-05-08 NOTE — Progress Notes (Signed)
ANTICOAGULATION CONSULT NOTE - Initial Consult  Pharmacy Consult for Warfarin Indication: atrial fibrillation  Allergies  Allergen Reactions  . Tape Other (See Comments)    Pulled off skin one time--- "it really sticks to me" but will probably tolerate paper tape     Patient Measurements: Height: 5\' 6"  (167.6 cm) IBW/kg (Calculated) : 63.8  Vital Signs: Temp: 97.5 F (36.4 C) (07/20 1035) Temp Source: Oral (07/20 1035) BP: 121/76 mmHg (07/20 1530) Pulse Rate: 61 (07/20 1530)  Labs:  Recent Labs  05/08/16 1203  HGB 14.0  HCT 39.2  PLT 163  LABPROT 26.5*  INR 2.47*  CREATININE 1.22    CrCl cannot be calculated (Unknown ideal weight.).   Medical History: Past Medical History  Diagnosis Date  . Hyperlipidemia   . Colon cancer ([place]) 1990    Had recurrence in 2003 resultinbg in chemotherapy. He recently had a colonoscopy and  had a polyp removed that was benign.  . Hypertension   . Ischemic heart disease     has had remote MI in 1997 and was treated with TPA. Prior PCI to RCA with repeat PCI to the RCA in 2003  . Kidney stone     x11  . Normal nuclear stress test Feb 2012    No significant ischemia. EF 56%; with basal inferior scar  . History of nephrolithiasis 01/23/2012  . Arthritis   . History of colon polyps   . GERD (gastroesophageal reflux disease)   . PAF (paroxysmal atrial fibrillation) ([place])   . Chronic anticoagulation   . Coronary artery disease   . Myocardial infarction ([place])   . Dysrhythmia   . CHF (congestive heart failure) ([place])   . Dementia     Medications:   (Not in a hospital admission) Scheduled:  Infusions:   Assessment: 82yo male with history of PAF on warfarin PTA presents with SOB and weakness. Pharmacy is consulted to dose warfarin for atrial fibrillation.   PTA Warfarin Dose: 1mg  Mon/Fri and 1.5mg  AODs with last dose 05/07/16  Goal of Therapy:  INR 2-3 Monitor platelets by anticoagulation protocol: Yes   Plan:  Warfarin  1.5mg  tonight x1 Daily INR Monitor s/sx of bleeding  Andrey Cota. Diona Foley, PharmD, [place] Clinical Pharmacist Pager [phone] 05/08/2016,4:50 PM

## 2016-05-08 NOTE — ED Notes (Signed)
Pt sts increased SOB and work of breathing x 3 days; pt denies swelling or pain

## 2016-05-08 NOTE — ED Notes (Signed)
Patient transported to X-ray 

## 2016-05-09 ENCOUNTER — Observation Stay (HOSPITAL_COMMUNITY): Payer: Medicare HMO

## 2016-05-09 ENCOUNTER — Telehealth: Payer: Self-pay | Admitting: *Deleted

## 2016-05-09 ENCOUNTER — Encounter (HOSPITAL_COMMUNITY): Payer: Self-pay | Admitting: Radiology

## 2016-05-09 DIAGNOSIS — F0390 Unspecified dementia without behavioral disturbance: Secondary | ICD-10-CM | POA: Diagnosis not present

## 2016-05-09 DIAGNOSIS — R0902 Hypoxemia: Secondary | ICD-10-CM | POA: Diagnosis not present

## 2016-05-09 DIAGNOSIS — R69 Illness, unspecified: Secondary | ICD-10-CM | POA: Diagnosis not present

## 2016-05-09 DIAGNOSIS — R918 Other nonspecific abnormal finding of lung field: Secondary | ICD-10-CM | POA: Diagnosis present

## 2016-05-09 DIAGNOSIS — I480 Paroxysmal atrial fibrillation: Secondary | ICD-10-CM | POA: Diagnosis not present

## 2016-05-09 DIAGNOSIS — R0600 Dyspnea, unspecified: Secondary | ICD-10-CM | POA: Diagnosis not present

## 2016-05-09 DIAGNOSIS — R531 Weakness: Secondary | ICD-10-CM | POA: Diagnosis not present

## 2016-05-09 DIAGNOSIS — E785 Hyperlipidemia, unspecified: Secondary | ICD-10-CM | POA: Diagnosis not present

## 2016-05-09 LAB — BASIC METABOLIC PANEL
ANION GAP: 5 (ref 5–15)
BUN: 20 mg/dL (ref 6–20)
CALCIUM: 9.7 mg/dL (ref 8.9–10.3)
CO2: 28 mmol/L (ref 22–32)
Chloride: 105 mmol/L (ref 101–111)
Creatinine, Ser: 1.07 mg/dL (ref 0.61–1.24)
GFR calc non Af Amer: >60 mL/min (ref >60)
Glucose, Bld: 97 mg/dL (ref 65–99)
Potassium: 4 mmol/L (ref 3.5–5.1)
Sodium: 138 mmol/L (ref 135–145)

## 2016-05-09 LAB — PROTIME-INR
INR: 2.11 — ABNORMAL HIGH (ref 0.00–1.49)
Prothrombin Time: 23.5 seconds — ABNORMAL HIGH (ref 11.6–15.2)

## 2016-05-09 LAB — CBC
HCT: 40.3 % (ref 39.0–52.0)
HEMOGLOBIN: 14.4 g/dL (ref 13.0–17.0)
MCH: 35.9 pg — AB (ref 26.0–34.0)
MCHC: 35.7 g/dL (ref 30.0–36.0)
MCV: 100.5 fL — AB (ref 78.0–100.0)
Platelets: 144 10*3/uL — ABNORMAL LOW (ref 150–400)
RBC: 4.01 MIL/uL — AB (ref 4.22–5.81)
RDW: 12.7 % (ref 11.5–15.5)
WBC: 9.5 10*3/uL (ref 4.0–10.5)

## 2016-05-09 LAB — TROPONIN I

## 2016-05-09 LAB — TSH: TSH: 0.972 u[IU]/mL (ref 0.350–4.500)

## 2016-05-09 MED ORDER — SENNOSIDES-DOCUSATE SODIUM 8.6-50 MG PO TABS: 2.0000 | ORAL_TABLET | Freq: Every day | ORAL | Status: DC

## 2016-05-09 MED ORDER — WARFARIN SODIUM 1 MG PO TABS: 1.0000 mg | ORAL_TABLET | Freq: Once | ORAL | Status: DC

## 2016-05-09 MED ORDER — SENNOSIDES-DOCUSATE SODIUM 8.6-50 MG PO TABS: 2.0000 | ORAL_TABLET | Freq: Every evening | ORAL | Status: AC | PRN

## 2016-05-09 NOTE — Telephone Encounter (Signed)
Transition Care Management Follow-up Telephone Call   Date discharged? 05/09/16   How have you been since you were released from the hospital? Spoke w/wife she states they just got home this morning   Do you understand why you were in the hospital? YES   Do you understand the discharge instructions? YES   Where were you discharged to? Home   Items Reviewed:  Medications reviewed: YES  Allergies reviewed: YES  Dietary changes reviewed: YES  Referrals reviewed: YES   Functional Questionnaire:   Activities of Daily Living (ADLs):   she states he are independent in the following: bathing and hygiene, feeding, continence, grooming, toileting and dressing States he require assistance with the following: ambulation   Any transportation issues/concerns?: NO   Any patient concerns? NO   Confirmed importance and date/time of follow-up visits scheduled YES, she states they actually made the appt before he left the hospital  Provider Appointment booked with Dr. Alain Marion  Confirmed with patient if condition begins to worsen call PCP or go to the ER.  Patient was given the office number and encouraged to call back with question or concerns.  : YES

## 2016-05-09 NOTE — Progress Notes (Signed)
05/09/2016 11:43 AM  Hulen Luster to be D/C'd Home per MD order.  Discussed prescriptions and follow up appointments with the patient. Prescriptions given to patient, medication list explained in detail. Pt verbalized understanding.    Medication List    STOP taking these medications        donepezil 5 MG tablet  Commonly known as:  ARICEPT      TAKE these medications        acetaminophen 500 MG tablet  Commonly known as:  TYLENOL  Take 500 mg by mouth every 6 (six) hours as needed (pain).     ARIPiprazole 10 MG tablet  Commonly known as:  ABILIFY  Take 1 tablet (10 mg total) by mouth daily.     aspirin 81 MG chewable tablet  Chew 1 tablet (81 mg total) by mouth daily.     atorvastatin 10 MG tablet  Commonly known as:  LIPITOR  Take 1 tablet (10 mg total) by mouth daily.     FISH OIL PO  Take 1 capsule by mouth daily.     GLUCOSAMINE-CHONDROITIN PO  Take 1 tablet by mouth daily.     hydrochlorothiazide 25 MG tablet  Commonly known as:  HYDRODIURIL  Take 25 mg by mouth daily.     losartan 100 MG tablet  Commonly known as:  COZAAR  Take 1 tablet (100 mg total) by mouth daily.     metoprolol tartrate 25 MG tablet  Commonly known as:  LOPRESSOR  TAKE 1 TABLET BY MOUTH TWICE DAILY     multivitamin with minerals Tabs tablet  Take 1 tablet by mouth daily.     omeprazole 20 MG capsule  Commonly known as:  PRILOSEC  Take 20 mg by mouth daily.     REFRESH 1.4-0.6 % ophthalmic solution  Generic drug:  polyvinyl alcohol-povidone  Place 1 drop into both eyes 4 (four) times daily as needed (dry eyes).     senna-docusate 8.6-50 MG tablet  Commonly known as:  SENOKOT S  Take 2 tablets by mouth at bedtime as needed for mild constipation. For constipation. Available over-the-counter.     VITAMIN D-3 PO  Take 1 tablet by mouth daily.     warfarin 1 MG tablet  Commonly known as:  COUMADIN  Take 1- 1 1/2 tablets daily as directed by coumadin clinic        Filed  Vitals:   05/09/16 0614 05/09/16 0855  BP: 146/75 135/68  Pulse: 63 75  Temp: 98.6 F (37 C) 98 F (36.7 C)  Resp: 18 18    Skin clean, dry and intact without evidence of skin break down, no evidence of skin tears noted. IV catheter discontinued intact. Site without signs and symptoms of complications. Dressing and pressure applied. Pt denies pain at this time. No complaints noted.  An After Visit Summary was printed and given to the patient. Patient escorted via [place], and D/C home via private auto.  [employer], RN-BC, [facility] [facility] Phone [phone]

## 2016-05-09 NOTE — Progress Notes (Signed)
Patient ID: Samuel Santiago, male   DOB: [birth year], 82 y.o.   MRN: [id number]     Subjective:  Less dyspnea  Objective:  Filed Vitals:   05/08/16 1838 05/08/16 2000 05/08/16 2304 05/09/16 0614  BP: 137/61 174/78 127/52 146/75  Pulse: 66 69 63 63  Temp: 98.5 F (36.9 C) 98.6 F (37 C)  98.6 F (37 C)  TempSrc: Oral     Resp: 18 17  18   Height: 5\' 6"  (1.676 m)     Weight: 180 lb 9.6 oz (81.92 kg)     SpO2: 96% 100%  97%    Intake/Output from previous day:  Intake/Output Summary (Last 24 hours) at 05/09/16 0809 Last data filed at 05/09/16 [id number]  Gross per 24 hour  Intake     80 ml  Output    700 ml  Net   -620 ml    Physical Exam: Affect appropriate Obese elderly white male  HEENT: injected sclera  Neck supple with no adenopathy JVP normal no bruits no thyromegaly Lungs clear with no wheezing and good diaphragmatic motion Heart:  S1/S2 no murmur, no rub, gallop or click PMI normal Abdomen: benighn, BS positve, no tenderness, no AAA no bruit.  No HSM or HJR Distal pulses intact with no bruits No edema Neuro non-focal Skin warm and dry No muscular weakness   Lab Results: Basic Metabolic Panel:  Recent Labs  05/08/16 1203 05/09/16 0540  NA 139 138  K 4.6 4.0  CL 105 105  CO2 30 28  GLUCOSE 103* 97  BUN 27* 20  CREATININE 1.22 1.07  CALCIUM 9.8 9.7   CBC:  Recent Labs  05/08/16 1203 05/09/16 0540  WBC 9.4 9.5  NEUTROABS 6.5  --   HGB 14.0 14.4  HCT 39.2 40.3  MCV 101.3* 100.5*  PLT 163 144*   Cardiac Enzymes:  Recent Labs  05/08/16 1917 05/09/16 0118  TROPONINI <0.03 <0.03   BNP: Invalid input(s): POCBNP D-Dimer:  Recent Labs  05/08/16 1917  DDIMER 1.14*   Hemoglobin A1C: No results for input(s): HGBA1C in the last 72 hours. Fasting Lipid Panel:  Recent Labs  05/08/16 1917  CHOL 148  HDL 39*  LDLCALC 85  TRIG 121  CHOLHDL 3.8   Thyroid Function Tests:  Recent Labs  05/09/16 0540  TSH 0.972    Imaging: Dg  Chest 2 View  05/08/2016  CLINICAL DATA:  Shortness of breath for 3 days EXAM: CHEST  2 VIEW COMPARISON:  November 19, 2015 FINDINGS: There is no edema or consolidation. Heart is upper normal in size with pulmonary vascularity within normal limits. No adenopathy. There are foci of calcification in the aorta. Bones are osteoporotic. There is degenerative change in the left shoulder. IMPRESSION: No edema or consolidation. Stable cardiac silhouette. Aortic atherosclerosis. Bones osteoporotic. Electronically Signed   By: Lowella Grip III M.D.   On: 05/08/2016 11:15   Ct Angio Chest Pe W Or Wo Contrast  05/09/2016  CLINICAL DATA:  Hypoxia. EXAM: CT ANGIOGRAPHY CHEST WITH CONTRAST TECHNIQUE: Multidetector CT imaging of the chest was performed using the standard protocol during bolus administration of intravenous contrast. Multiplanar CT image reconstructions and MIPs were obtained to evaluate the vascular anatomy. CONTRAST:  100 cc Isovue 370 intravenous COMPARISON:  None. FINDINGS: Cardiovascular: Negative for pulmonary embolism. Normal heart size. No pericardial effusion. Aortic atherosclerosis. No evidence of acute aortic syndrome. Mediastinum: Small sliding hiatal hernia. Lungs/Pleura: There is no edema, consolidation, effusion, or pneumothorax. Diffuse airway thickening  with occasional collapse or effacement of airways. There are 4 closely neighboring subpleural nodules along the upper major fissure on the right, measuring up to 7 mm (7 x 25mm) on 7:34. Upper abdomen: No acute findings. Cholelithiasis. Large partly seen renal cysts. Musculoskeletal: No chest wall mass or suspicious bone lesions identified. Review of the MIP images confirms the above findings. IMPRESSION: 1. Negative for pulmonary embolism. 2. Bronchomalacia with multi focal airway collapse. Bronchial wall thickening. 3. Four subpleural pulmonary nodules in the right upper lobe measuring up to 7 mm. If appropriate for comorbidities, recommend  chest CT in 3-6 months. 4. Cholelithiasis. Electronically Signed   By: Monte Fantasia M.D.   On: 05/09/2016 00:41    Cardiac Studies:  ECG:  SR nonspecific ST changes    Telemetry:  NSR no arrhythmia   Echo: pending EF 50-55% 08/2015   Medications:   . ARIPiprazole  5 mg Oral Daily  . aspirin  81 mg Oral Daily  . atorvastatin  10 mg Oral q1800  . furosemide  40 mg Intravenous Daily  . losartan  100 mg Oral Daily  . metoprolol tartrate  25 mg Oral BID  . multivitamin with minerals  1 tablet Oral Daily  . pantoprazole  40 mg Oral Daily  . sodium chloride flush  3 mL Intravenous Q12H  . warfarin  1.5 mg Oral ONCE-1800  . Warfarin - Pharmacist Dosing Inpatient   Does not apply q1800       Assessment/Plan:   Dysonea:  Not cardiac related BNP normal CXR no CHF D dimer elevated CT with bronchiomalacia would d/c iv lasix after today HTN: Well controlled.  Continue current medications and low sodium Dash type diet.   Dementia on aricept  PAF:  maint NSR INR Rx   Will sign off  Jenkins Rouge 05/09/2016, 8:09 AM

## 2016-05-09 NOTE — Progress Notes (Signed)
PT Cancellation/Discharge Note  Patient Details Name: Samuel Santiago MRN: [id number] DOB: 07/06/1929   Cancelled Treatment:    Reason Eval/Treat Not Completed: PT screened, no needs identified, will sign off. Received PT consult this am at 10:00.  On arrival, patient on EOB dressing with wife and RN giving d/c instructions.  Patient and wife report patient is ambulating at baseline and uses cane at home.  He has a RW if needed.  They report no PT needs at this time.  Instructed wife to contact patient's MD if HHPT is needed in future.  PT will sign off.   Despina Pole 05/09/2016, 11:36 AM Carita Pian. Sanjuana Kava, [place] Pager [phone]

## 2016-05-09 NOTE — Discharge Summary (Signed)
Physician Discharge Summary   Patient ID: Samuel Santiago MRN: [id number] DOB/AGE: 01-17-1929 82 y.o.  Admit date: 05/08/2016 Discharge date: 05/09/2016  Primary Care Physician:  Walker Kehr, MD  Discharge Diagnoses:      Dyspnea unclear etiology, possibly due to mild acute on chronic diastolic CHF, resolved  . Atrial fibrillation ([place]) . Dementia . Dyslipidemia . Essential hypertension . generalized debility  . Pulmonary nodules  Consults: Cardiology, Dr.Nishan  Recommendations for Outpatient Follow-up:  1. Please repeat CBC/BMET at next visit   DIET: Heart healthy diet    Allergies:   Allergies  Allergen Reactions  . Tape Other (See Comments)    Pulled off skin one time--- "it really sticks to me" but will probably tolerate paper tape      DISCHARGE MEDICATIONS: Current Discharge Medication List    START taking these medications   Details  senna-docusate (SENOKOT S) 8.6-50 MG tablet Take 2 tablets by mouth at bedtime as needed for mild constipation. For constipation. Available over-the-counter. Qty: 30 tablet, Refills: 3      CONTINUE these medications which have NOT CHANGED   Details  acetaminophen (TYLENOL) 500 MG tablet Take 500 mg by mouth every 6 (six) hours as needed (pain).    ARIPiprazole (ABILIFY) 10 MG tablet Take 1 tablet (10 mg total) by mouth daily. Qty: 30 tablet, Refills: 3    aspirin 81 MG chewable tablet Chew 1 tablet (81 mg total) by mouth daily.    atorvastatin (LIPITOR) 10 MG tablet Take 1 tablet (10 mg total) by mouth daily. Qty: 90 tablet, Refills: 3    Cholecalciferol (VITAMIN D-3 PO) Take 1 tablet by mouth daily.     GLUCOSAMINE-CHONDROITIN PO Take 1 tablet by mouth daily.    hydrochlorothiazide (HYDRODIURIL) 25 MG tablet Take 25 mg by mouth daily.    losartan (COZAAR) 100 MG tablet Take 1 tablet (100 mg total) by mouth daily. Qty: 90 tablet, Refills: 3    metoprolol tartrate (LOPRESSOR) 25 MG tablet TAKE 1 TABLET BY  MOUTH TWICE DAILY Qty: 120 tablet, Refills: 1    Multiple Vitamin (MULTIVITAMIN WITH MINERALS) TABS tablet Take 1 tablet by mouth daily.    Omega-3 Fatty Acids (FISH OIL PO) Take 1 capsule by mouth daily.    omeprazole (PRILOSEC) 20 MG capsule Take 20 mg by mouth daily.    polyvinyl alcohol-povidone (REFRESH) 1.4-0.6 % ophthalmic solution Place 1 drop into both eyes 4 (four) times daily as needed (dry eyes).     warfarin (COUMADIN) 1 MG tablet Take 1- 1 1/2 tablets daily as directed by coumadin clinic Qty: 40 tablet, Refills: 2      STOP taking these medications     donepezil (ARICEPT) 5 MG tablet          Brief H and P: For complete details please refer to admission H and P, but in briefPatient is a 82 year old male with CAD, atrial fibrillation on Coumadin, hypertension, hyperlipidemia, prior history of colon cancer, GERD, mild dementia, diastolic CHF presented to ED with worsening shortness of breath and generalized weakness. History was obtained from the patient and his wife at the bedside. Patient's wife reported that she had noticed him getting more and more short of breath in the last 2-3 weeks, he gained 10 pounds, having difficulty lying flat in bed so he was mostly sitting up on the edge of the chair to catch breath. He denied any fevers or chills, coughing any wheezing or productive phlegm. Patient's wife reported that he  was at 190 pounds (which is more than 10 pounds heavier than his dry weight, usually around 170lbs), so he started eating less. However he continued to feel shortness of breath, peripheral edema, abdominal distention. Per wife today he was very short of breath and hence brought to ED. Patient denied any focal weakness. ED course: Chest x-ray showed no pneumonia, BNP 71, troponin 0, no leukocytosis, and creatinine 1.2, CBC unremarkable, EKG showed no acute ischemic changes. Patient was ambulated in the hallway and felt very weak, short of breath, hence hospitalist  service was requested for admission for observation.   Hospital Course:  Dyspnea unclear etiology, likely secondary to acute on chronic diastolic CHF, prior echo [id number] 2016 showed EF of 50-55% with grade 1 diastolic dysfunction. INR therapeutic 2.47, hence PE unlikely, chest x-ray showed no pneumonia or consolidation. No wheezing on exam. -Serial cardiac enzymes were negative. EKG showed no acute changes of ischemia - Patient was given Lasix 40 mg IV 2 doses with significant improvement with his symptoms. Cardiology did not recommend outpatient Lasix. - D-dimer was elevated hence patient underwent CT and exam of the chest which showed no pulmonary embolism - Patient was seen by cardiology, Dr Johnsie Cancel, recommended discharge home, no further ischemia workup. Patient has outpatient appointment with cardiology scheduled.   Dyslipidemia - Lipid panel showed LDL 85, continue statin,continue statin   Essential hypertension - Currently stable, continue metoprolol, losartan, resume HCTZ   Atrial fibrillation (HCC) - Currently rate controlled, continue metoprolol - CHADSVASC >4, continue warfarin   Dementia - Continue Abilify   Generalized Weakness - PTOT evaluation, on exam no focal weakness    Pulmonary nodules - CT of the chest showed incidentally pulmonary nodules, less than 7 mm. Explained to the patient's wife in detail, patient has a history of colon cancer, outpatient CT in 3-6 months for follow-up of the pulmonary nodule.  Constipation Given prescription for Senokot-S at bedtime as needed   Day of Discharge BP 135/68 mmHg  Pulse 75  Temp(Src) 98 F (36.7 C) (Oral)  Resp 18  Ht 5\' 6"  (1.676 m)  Wt 81.92 kg (180 lb 9.6 oz)  BMI 29.16 kg/m2  SpO2 98%  Physical Exam: General: Alert and awake oriented x3 not in any acute distress. HEENT: anicteric sclera, pupils reactive to light and accommodation CVS: S1-S2 clear no murmur rubs or gallops Chest: clear to auscultation  bilaterally, no wheezing rales or rhonchi Abdomen: soft nontender, nondistended, normal bowel sounds Extremities: no cyanosis, clubbing or edema noted bilaterally Neuro: Cranial nerves II-XII intact, no focal neurological deficits   The results of significant diagnostics from this hospitalization (including imaging, microbiology, ancillary and laboratory) are listed below for reference.    LAB RESULTS: Basic Metabolic Panel:  Recent Labs Lab 05/08/16 1203 05/09/16 0540  NA 139 138  K 4.6 4.0  CL 105 105  CO2 30 28  GLUCOSE 103* 97  BUN 27* 20  CREATININE 1.22 1.07  CALCIUM 9.8 9.7   Liver Function Tests: No results for input(s): AST, ALT, ALKPHOS, BILITOT, PROT, ALBUMIN in the last 168 hours. No results for input(s): LIPASE, AMYLASE in the last 168 hours. No results for input(s): AMMONIA in the last 168 hours. CBC:  Recent Labs Lab 05/08/16 1203 05/09/16 0540  WBC 9.4 9.5  NEUTROABS 6.5  --   HGB 14.0 14.4  HCT 39.2 40.3  MCV 101.3* 100.5*  PLT 163 144*   Cardiac Enzymes:  Recent Labs Lab 05/09/16 0118 05/09/16 0749  TROPONINI <0.03 <  0.03   BNP: Invalid input(s): POCBNP CBG: No results for input(s): GLUCAP in the last 168 hours.  Significant Diagnostic Studies:  Dg Chest 2 View  05/08/2016  CLINICAL DATA:  Shortness of breath for 3 days EXAM: CHEST  2 VIEW COMPARISON:  November 19, 2015 FINDINGS: There is no edema or consolidation. Heart is upper normal in size with pulmonary vascularity within normal limits. No adenopathy. There are foci of calcification in the aorta. Bones are osteoporotic. There is degenerative change in the left shoulder. IMPRESSION: No edema or consolidation. Stable cardiac silhouette. Aortic atherosclerosis. Bones osteoporotic. Electronically Signed   By: Lowella Grip III M.D.   On: 05/08/2016 11:15   Ct Angio Chest Pe W Or Wo Contrast  05/09/2016  CLINICAL DATA:  Hypoxia. EXAM: CT ANGIOGRAPHY CHEST WITH CONTRAST TECHNIQUE:  Multidetector CT imaging of the chest was performed using the standard protocol during bolus administration of intravenous contrast. Multiplanar CT image reconstructions and MIPs were obtained to evaluate the vascular anatomy. CONTRAST:  100 cc Isovue 370 intravenous COMPARISON:  None. FINDINGS: Cardiovascular: Negative for pulmonary embolism. Normal heart size. No pericardial effusion. Aortic atherosclerosis. No evidence of acute aortic syndrome. Mediastinum: Small sliding hiatal hernia. Lungs/Pleura: There is no edema, consolidation, effusion, or pneumothorax. Diffuse airway thickening with occasional collapse or effacement of airways. There are 4 closely neighboring subpleural nodules along the upper major fissure on the right, measuring up to 7 mm (7 x 62mm) on 7:34. Upper abdomen: No acute findings. Cholelithiasis. Large partly seen renal cysts. Musculoskeletal: No chest wall mass or suspicious bone lesions identified. Review of the MIP images confirms the above findings. IMPRESSION: 1. Negative for pulmonary embolism. 2. Bronchomalacia with multi focal airway collapse. Bronchial wall thickening. 3. Four subpleural pulmonary nodules in the right upper lobe measuring up to 7 mm. If appropriate for comorbidities, recommend chest CT in 3-6 months. 4. Cholelithiasis. Electronically Signed   By: Monte Fantasia M.D.   On: 05/09/2016 00:41    2D ECHO:   Disposition and Follow-up: Discharge Instructions    ([place]) Call MD:  Anytime you have any of the following symptoms: 1) 3 pound weight gain in 24 hours or 5 pounds in 1 week 2) shortness of breath, with or without a dry hacking cough 3) swelling in the hands, feet or stomach 4) if you have to sleep on extra pillows at night in order to breathe.    Complete by:  As directed      Diet - low sodium heart healthy    Complete by:  As directed      Increase activity slowly    Complete by:  As directed             DISPOSITION:  Home   DISCHARGE FOLLOW-UP Follow-up Information    Go to Loralie Champagne, MD.   Specialty:  Cardiology   Why:  for hospital follow-up on 05/19/2016 @9 :30a    Contact information:   [address] [place] [address] [place] [address] [phone]       Go to Walker Kehr, MD.   Specialty:  Internal Medicine   Why:  for hospital follow-up on 05/26/2016 @2 :30p   Contact information:   [place] [address] [phone]        Time spent on Discharge: 57mins   Signed:   RAI,RIPUDEEP M.D. [facility] 05/09/2016, 11:18 AM Pager: [phone]

## 2016-05-09 NOTE — Progress Notes (Signed)
ANTICOAGULATION CONSULT NOTE - Initial Consult  Pharmacy Consult for warfarin Indication: atrial fibrillation  Allergies  Allergen Reactions  . Tape Other (See Comments)    Pulled off skin one time--- "it really sticks to me" but will probably tolerate paper tape     Patient Measurements: Height: 5\' 6"  (167.6 cm) Weight: 180 lb 9.6 oz (81.92 kg) IBW/kg (Calculated) : 63.8  Vital Signs: Temp: 98 F (36.7 C) (07/21 0855) Temp Source: Oral (07/21 0855) BP: 135/68 mmHg (07/21 0855) Pulse Rate: 75 (07/21 0855)  Labs:  Recent Labs  05/08/16 1203 05/08/16 1917 05/09/16 0118 05/09/16 0540 05/09/16 0749  HGB 14.0  --   --  14.4  --   HCT 39.2  --   --  40.3  --   PLT 163  --   --  144*  --   LABPROT 26.5*  --   --  23.5*  --   INR 2.47*  --   --  2.11*  --   CREATININE 1.22  --   --  1.07  --   TROPONINI  --  <0.03 <0.03  --  <0.03    Estimated Creatinine Clearance: 51.6 mL/min (by C-G formula based on Cr of 1.07).   Medical History: Past Medical History  Diagnosis Date  . Hyperlipidemia   . Colon cancer ([place]) 1990    Had recurrence in 2003 resultinbg in chemotherapy. He recently had a colonoscopy and  had a polyp removed that was benign.  . Hypertension   . Ischemic heart disease     has had remote MI in 1997 and was treated with TPA. Prior PCI to RCA with repeat PCI to the RCA in 2003  . Kidney stone     x11  . Normal nuclear stress test Feb 2012    No significant ischemia. EF 56%; with basal inferior scar  . History of nephrolithiasis 01/23/2012  . Arthritis   . History of colon polyps   . GERD (gastroesophageal reflux disease)   . PAF (paroxysmal atrial fibrillation) ([place])   . Chronic anticoagulation   . Coronary artery disease   . Myocardial infarction ([place])   . Dysrhythmia   . CHF (congestive heart failure) ([place])     EF 50-55% 2016  . Dementia    Assessment: -PTA Warfarin Dose: 1mg  Mon/Fri and 1.5mg  AODs  -INR therapeutic 2.11 -Hgb/Hct WNL, Plts low  144 -Blood was noted in stool on admission, but given normal Hgb, will continue dosing per protocol  Goal of Therapy:  INR 2-3 Monitor platelets by anticoagulation protocol: Yes    Plan:  Warfarin 1mg  tonight x1 (home dose) Daily INR Monitor s/sx of bleeding  Myer Peer Grayland Ormond), PharmD  PGY1 Pharmacy Resident Pager: [phone] 05/09/2016 11:07 AM

## 2016-05-16 ENCOUNTER — Other Ambulatory Visit: Payer: Self-pay | Admitting: Cardiology

## 2016-05-19 ENCOUNTER — Ambulatory Visit (INDEPENDENT_AMBULATORY_CARE_PROVIDER_SITE_OTHER): Payer: Medicare HMO | Admitting: Cardiology

## 2016-05-19 ENCOUNTER — Encounter (INDEPENDENT_AMBULATORY_CARE_PROVIDER_SITE_OTHER): Payer: Self-pay

## 2016-05-19 ENCOUNTER — Encounter: Payer: Self-pay | Admitting: Cardiology

## 2016-05-19 VITALS — BP 116/68 | HR 56 | Ht 66.0 in | Wt 181.8 lb

## 2016-05-19 DIAGNOSIS — I259 Chronic ischemic heart disease, unspecified: Secondary | ICD-10-CM | POA: Diagnosis not present

## 2016-05-19 DIAGNOSIS — I2510 Atherosclerotic heart disease of native coronary artery without angina pectoris: Secondary | ICD-10-CM | POA: Diagnosis not present

## 2016-05-19 DIAGNOSIS — Z7901 Long term (current) use of anticoagulants: Secondary | ICD-10-CM

## 2016-05-19 DIAGNOSIS — I480 Paroxysmal atrial fibrillation: Secondary | ICD-10-CM

## 2016-05-19 DIAGNOSIS — E785 Hyperlipidemia, unspecified: Secondary | ICD-10-CM

## 2016-05-19 DIAGNOSIS — Z5181 Encounter for therapeutic drug level monitoring: Secondary | ICD-10-CM | POA: Diagnosis not present

## 2016-05-19 DIAGNOSIS — I10 Essential (primary) hypertension: Secondary | ICD-10-CM
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
## 2016-05-19 NOTE — Patient Instructions (Signed)
Medication Instructions:  The current medical regimen is effective;  continue present plan and medications.  Follow-Up: Follow up in 4 months with Lori Gerhardt, NP.  If you need a refill on your cardiac medications before your next appointment, please call your pharmacy.  Thank you for choosing [facility]!!     

## 2016-05-23 ENCOUNTER — Ambulatory Visit (INDEPENDENT_AMBULATORY_CARE_PROVIDER_SITE_OTHER): Payer: Medicare HMO | Admitting: Pharmacist

## 2016-05-23 DIAGNOSIS — Z5181 Encounter for therapeutic drug level monitoring: Secondary | ICD-10-CM

## 2016-05-23 DIAGNOSIS — Z7901 Long term (current) use of anticoagulants: Secondary | ICD-10-CM | POA: Diagnosis not present

## 2016-05-23 DIAGNOSIS — I4891 Unspecified atrial fibrillation: Secondary | ICD-10-CM

## 2016-05-23 LAB — POCT INR: INR: 1.8

## 2016-05-26 ENCOUNTER — Inpatient Hospital Stay: Payer: Self-pay | Admitting: Internal Medicine

## 2016-06-11 ENCOUNTER — Other Ambulatory Visit: Payer: Self-pay | Admitting: Internal Medicine

## 2016-06-13 ENCOUNTER — Ambulatory Visit (INDEPENDENT_AMBULATORY_CARE_PROVIDER_SITE_OTHER): Payer: Medicare HMO | Admitting: *Deleted

## 2016-06-13 DIAGNOSIS — Z5181 Encounter for therapeutic drug level monitoring: Secondary | ICD-10-CM | POA: Diagnosis not present

## 2016-06-13 DIAGNOSIS — Z7901 Long term (current) use of anticoagulants: Secondary | ICD-10-CM | POA: Diagnosis not present

## 2016-06-13 DIAGNOSIS — I4891 Unspecified atrial fibrillation: Secondary | ICD-10-CM

## 2016-06-13 LAB — POCT INR: INR: 2

## 2016-06-26 DIAGNOSIS — Z23 Encounter for immunization: Secondary | ICD-10-CM | POA: Diagnosis not present

## 2016-07-01 ENCOUNTER — Ambulatory Visit: Payer: Medicare HMO | Admitting: Nurse Practitioner

## 2016-07-04 ENCOUNTER — Encounter (HOSPITAL_COMMUNITY): Payer: Self-pay | Admitting: Psychiatry

## 2016-07-04 ENCOUNTER — Ambulatory Visit (INDEPENDENT_AMBULATORY_CARE_PROVIDER_SITE_OTHER): Payer: Medicare HMO | Admitting: Psychiatry

## 2016-07-04 ENCOUNTER — Encounter (INDEPENDENT_AMBULATORY_CARE_PROVIDER_SITE_OTHER): Payer: Self-pay

## 2016-07-04 VITALS — BP 132/78 | HR 61 | Ht 64.0 in | Wt 176.8 lb

## 2016-07-04 DIAGNOSIS — Z79899 Other long term (current) drug therapy: Secondary | ICD-10-CM

## 2016-07-04 DIAGNOSIS — Z818 Family history of other mental and behavioral disorders: Secondary | ICD-10-CM

## 2016-07-04 DIAGNOSIS — F03918 Unspecified dementia, unspecified severity, with other behavioral disturbance: Secondary | ICD-10-CM

## 2016-07-04 DIAGNOSIS — Z8261 Family history of arthritis: Secondary | ICD-10-CM | POA: Diagnosis not present

## 2016-07-04 DIAGNOSIS — Z8371 Family history of colonic polyps: Secondary | ICD-10-CM

## 2016-07-04 DIAGNOSIS — F0391 Unspecified dementia with behavioral disturbance: Secondary | ICD-10-CM | POA: Diagnosis not present

## 2016-07-04 DIAGNOSIS — Z87891 Personal history of nicotine dependence: Secondary | ICD-10-CM

## 2016-07-04 MED ORDER — DIVALPROEX SODIUM ER 250 MG PO TB24: ORAL_TABLET | ORAL | 5 refills | Status: DC

## 2016-07-04 NOTE — Progress Notes (Signed)
Psychiatric Initial Adult Assessment   Patient Identification: Samuel Santiago MRN:  [id number] Date of Evaluation:  07/04/2016 Referral Source:Dr Plotnikov Chief Complaint:   Visit Diagnosis:   History of Present Illness:   This patient is a 82 year old married white male is being asked to see because of possible suicidal thinking and psychosis. At this time the patient is not suicidal. He seen with his daughter and his wife Samuel Santiago. Apparently the patient is that the husband has been extremely possessive and jealous of his wife since they were married over 42 years ago. According to the wife and the daughter is gotten much much worse in the last one year and perhaps even worse in the last month. At some point the primary care doctor began the patient on Abilify and for a while he seemed to make less statements and accusations. But lately it is returned. Wife is been so distressed at times that she spent months away from patient. At some point the patient was so upset that his wife is gone and he threatened suicide which I believe was manipulative. He's never made an attempt of suicide and he didn't this time. Told the family he could live without his wife and he kill himself that she didn't come back. She of course is called back. According to the daughter the patient's wife has been very little inconsistent. She is a great caregiver. They've been married for 65 years and according to the patient he loves him can communicate well with her. The patient been retired for over 20 years. Financially stable. He experienced no deaths. A close evaluation the patient actually denies daily depression but I do believe he has anhedonia. The family states that since the patient is been on Abilify while he seemed to be better for short period he now appears withdrawn lethargic his drooling all the time and seems less connected the world around. Noted is the patient is also on Aricept 5 mg. At this time the patient says  his sleep has changed. His wife agrees. He's now taking more naps. Is not clear if the Abilify is contributing to this problem or not. He is eating well but he has a low energy level. He can think and concentrate without problems. Nobody thinks he has a memory problem. They say he remembers very well. He denies a feeling of worthlessness yet he's made statements that he loves him. He denies a suicidal now and has never made a suicide attempt. The patient has rare things that he does for enjoyment. He'll go and have a beer with his son Samuel Santiago once in a while. The patient denies the use of alcohol on a persistent basis.  The patient denies auditory or visual hallucinations. He is not really paranoid at all. He denies mania. He denies ever having a distinct episode of major depression. While he worries a great deal he does not meet criteria for generalized anxiety disorder. He is not a panic problem or OCD. The patient no longer drives. His past psychiatric history is negative. He's never been a psychiatric hospital, seen a psychiatrist or been in therapy. Psychiatric medicines she's been on right now his Abilify 5 mg. The patient is a number of medical conditions. He was hospitalized for what was thought to be a TIA and has been on Coumadin since. History of atrial fibrillation and does have hypercholesterolemia. At this time the patient is not dangerous to himself or to anyone else. Associated Signs/Symptoms: Depression Symptoms:  anhedonia, (Hypo)  Manic Symptoms:   Anxiety Symptoms:   Psychotic Symptoms:  Delusions, PTSD Symptoms:   Past Psychiatric History:   Previous Psychotropic Medications: Yes   Substance Abuse History in the last 12 months:  No.  Consequences of Substance Abuse:   Past Medical History:  Past Medical History:  Diagnosis Date  . Arthritis   . CHF (congestive heart failure) ([place])    EF 50-55% 2016  . Chronic anticoagulation   . Colon cancer ([place]) 1990   Had recurrence in  2003 resultinbg in chemotherapy. He recently had a colonoscopy and  had a polyp removed that was benign.  . Coronary artery disease   . Dementia   . Dysrhythmia   . GERD (gastroesophageal reflux disease)   . History of colon polyps   . History of nephrolithiasis 01/23/2012  . Hyperlipidemia   . Hypertension   . Ischemic heart disease    has had remote MI in 1997 and was treated with TPA. Prior PCI to RCA with repeat PCI to the RCA in 2003  . Kidney stone    x11  . Myocardial infarction ([place])   . Normal nuclear stress test Feb 2012   No significant ischemia. EF 56%; with basal inferior scar  . PAF (paroxysmal atrial fibrillation) ([place])     Past Surgical History:  Procedure Laterality Date  . COLON SURGERY     x 2 for cancer  . COLONOSCOPY     colon cancer  . [place] & 2003   PCI to RCA x 2  . EYE SURGERY      Family Psychiatric History:   Family History:  Family History  Problem Relation Age of Onset  . Arthritis Sister   . Breast cancer Sister   . Congestive Heart Failure Mother   . Stroke Mother   . Heart disease Father   . Colon polyps Son   . Colon polyps Daughter   . Heart attack Brother     Social History:   Social History   Social History  . Marital status: Married    Spouse name: N/A  . Number of children: 3  . Years of education: N/A   Occupational History  . retired    Social History Main Topics  . Smoking status: Former Smoker    Quit date: 10/20/1985  . Smokeless tobacco: Never Used  . Alcohol use No  . Drug use: No  . Sexual activity: Not Asked   Other Topics Concern  . None   Social History Narrative  . None    Additional Social History:   Allergies:   Allergies  Allergen Reactions  . Tape Other (See Comments)    Pulled off skin one time--- "it really sticks to me" but will probably tolerate paper tape     Metabolic Disorder Labs: Lab Results  Component Value Date   HGBA1C 5.8 (H) 11/19/2015   MPG 120  11/19/2015   MPG 123 09/19/2015   No results found for: PROLACTIN Lab Results  Component Value Date   CHOL 148 05/08/2016   TRIG 121 05/08/2016   HDL 39 (L) 05/08/2016   CHOLHDL 3.8 05/08/2016   VLDL 24 05/08/2016   LDLCALC 85 05/08/2016   LDLCALC 75 01/30/2016     Current Medications: Current Outpatient Prescriptions  Medication Sig Dispense Refill  . acetaminophen (TYLENOL) 500 MG tablet Take 500 mg by mouth every 6 (six) hours as needed (pain).    . ARIPiprazole (ABILIFY) 5 MG tablet  Take 5 mg by mouth daily.    Marland Kitchen aspirin 81 MG chewable tablet Chew 1 tablet (81 mg total) by mouth daily.    Marland Kitchen atorvastatin (LIPITOR) 10 MG tablet Take 1 tablet (10 mg total) by mouth daily. 90 tablet 3  . Cholecalciferol (VITAMIN D-3 PO) Take 1 tablet by mouth daily.     Marland Kitchen donepezil (ARICEPT) 5 MG tablet Take 5 mg by mouth at bedtime.    Marland Kitchen GLUCOSAMINE-CHONDROITIN PO Take 1 tablet by mouth daily.    . hydrochlorothiazide (HYDRODIURIL) 25 MG tablet Take 25 mg by mouth daily.    Marland Kitchen losartan (COZAAR) 100 MG tablet Take 1 tablet (100 mg total) by mouth daily. 90 tablet 3  . metoprolol tartrate (LOPRESSOR) 25 MG tablet TAKE ONE TABLET BY MOUTH TWICE DAILY 180 tablet 2  . Multiple Vitamin (MULTIVITAMIN WITH MINERALS) TABS tablet Take 1 tablet by mouth daily.    . Omega-3 Fatty Acids (FISH OIL PO) Take 1 capsule by mouth daily.    Marland Kitchen omeprazole (PRILOSEC) 20 MG capsule Take 20 mg by mouth daily.    Marland Kitchen omeprazole (PRILOSEC) 20 MG capsule Take 1 capsule (20 mg total) by mouth daily. Yearly physical due in Dec must see MD for refills 90 capsule 1  . polyvinyl alcohol-povidone (REFRESH) 1.4-0.6 % ophthalmic solution Place 1 drop into both eyes 4 (four) times daily as needed (dry eyes).     Marland Kitchen senna-docusate (SENOKOT S) 8.6-50 MG tablet Take 2 tablets by mouth at bedtime as needed for mild constipation. For constipation. Available over-the-counter. 30 tablet 3  . warfarin (COUMADIN) 1 MG tablet Take as directed by  coumadin clinic 40 tablet 2  . divalproex (DEPAKOTE ER) 250 MG 24 hr tablet 1 qam for 1 week then 2  qam 60 tablet 5   No current facility-administered medications for this visit.     Neurologic: Headache: No Seizure: No Paresthesias:No  Musculoskeletal: Strength & Muscle Tone: decreased Gait & Station: unsteady Patient leans: N/A  Psychiatric Specialty Exam: ROS  Blood pressure 132/78, pulse 61, height 5\' 4"  (1.626 m), weight 176 lb 12.8 oz (80.2 kg).Body mass index is 30.35 kg/m.  General Appearance: Casual  Eye Contact:  Fair  Speech:  Clear and Coherent  Volume:  Normal  Mood:  Depressed  Affect:  Appropriate    Orientation:  Full (Time, Place, and Person)  Thought Content:  WDL  Suicidal Thoughts:  No  Homicidal Thoughts:  No  Memory:  NA  Judgement:  Fair  Insight:  Lacking  Psychomotor Activity:  Decreased  Concentration:    Recall:  [place] of Knowledge:Fair  Language: Good  Akathisia:  No  Handed:  Right  AIMS (if indicated):    Assets:  Desire for Improvement  ADL's:  Impaired  Cognition: WNL  Sleep:      Treatment Plan Summary:  At this time this patient represents a complex presentation and a diagnostic dilemma. His primary care doctor correctly seemed to think he was experiencing psychosis in his fixed belief that his wife was having ongoing affairs with a neighbor named Doctor, general practice. I do not believe that she is having affairs.. The patient has had suspicious and jealous thinking through decades. This seems to be more character trait it seems to gotten to a psychotic proportion. The patient does show some evidence of depression in terms of anhedonia and problems with sleep and energy. I think the most specific symptom that is relevant is the patient is very irritable.  He is easily frustrated and is impulsive. Irritability of course could be a reflection of being depressed. He Is impulsive with his statements. He has not been violent. Is not dangerous to  himself or to anyone else. Overall the Abilify seems to have more problems that it's worth. The family describes him as being very lethargic drooling while is not impulsive as much with his statements he seems not connected. He clearly seems that he is overmedicated. This patient at risk for falls. He uses his a cane and is not stable on his feet. His family claims since the Abilify many ways is decline. He has no interest in anything. He also fear that is making him sleep during the day and therefore does not sleep at night. At this time I interventions are to discontinue the Abilify and to begin this patient on Depakote and titrated up to 500 mg ER. For now the patient continue Aricept 5 mg but not sure it is very relevant. My target symptoms are that the patient is less irritable. That he sleeping better which also will help his irritability. The possibility that the Abilify is affecting his sleep is not clear. Technically given the fact that person has cardiovascular disease and probably cerebrovascular disease the use of an antipsychotic and elderly person should be approached with caution. The fact that he is on Aricept and implicates the believe that he has dementia and this would generally be contraindicated to take Abilify. I don't think is completely out of the question to use an antipsychotic medicine but a think in this case I think we should approach his irritability first. The possibility of a sleeping aid at a later time could be considered. The possibility of an antidepressant should also be considered. All these considerations will be looked at in 7 weeks when he returns to see me. The patient is not suicidal.   Haskel Schroeder, MD 9/15/201711:26 AM

## 2016-07-11 ENCOUNTER — Ambulatory Visit (INDEPENDENT_AMBULATORY_CARE_PROVIDER_SITE_OTHER): Payer: Medicare HMO | Admitting: *Deleted

## 2016-07-11 DIAGNOSIS — Z7901 Long term (current) use of anticoagulants: Secondary | ICD-10-CM | POA: Diagnosis not present

## 2016-07-11 DIAGNOSIS — Z5181 Encounter for therapeutic drug level monitoring: Secondary | ICD-10-CM | POA: Diagnosis not present

## 2016-07-11 DIAGNOSIS — I4891 Unspecified atrial fibrillation: Secondary | ICD-10-CM

## 2016-07-11 LAB — POCT INR: INR: 4

## 2016-07-21 ENCOUNTER — Ambulatory Visit (INDEPENDENT_AMBULATORY_CARE_PROVIDER_SITE_OTHER): Payer: Self-pay | Admitting: Orthopedic Surgery

## 2016-07-25 ENCOUNTER — Ambulatory Visit (INDEPENDENT_AMBULATORY_CARE_PROVIDER_SITE_OTHER): Payer: Medicare HMO | Admitting: *Deleted

## 2016-07-25 DIAGNOSIS — I4891 Unspecified atrial fibrillation: Secondary | ICD-10-CM

## 2016-07-25 DIAGNOSIS — Z5181 Encounter for therapeutic drug level monitoring: Secondary | ICD-10-CM

## 2016-07-25 DIAGNOSIS — Z7901 Long term (current) use of anticoagulants: Secondary | ICD-10-CM

## 2016-07-25 LAB — POCT INR: INR: 2.2

## 2016-07-29 ENCOUNTER — Telehealth (HOSPITAL_COMMUNITY): Payer: Self-pay

## 2016-07-29 NOTE — Telephone Encounter (Signed)
Patients wife called and said that the patient is doing well, his mood is improved, however he seems more agitated, restless....she is wondering if it could be the Depakote and was wanting to split the dose and give him one pill in the morning and one at night instead of both at once. Please review and advise, thank you

## 2016-08-08 ENCOUNTER — Ambulatory Visit (INDEPENDENT_AMBULATORY_CARE_PROVIDER_SITE_OTHER): Payer: Medicare HMO | Admitting: *Deleted

## 2016-08-08 DIAGNOSIS — Z5181 Encounter for therapeutic drug level monitoring: Secondary | ICD-10-CM

## 2016-08-08 DIAGNOSIS — Z7901 Long term (current) use of anticoagulants: Secondary | ICD-10-CM

## 2016-08-08 DIAGNOSIS — I4891 Unspecified atrial fibrillation: Secondary | ICD-10-CM | POA: Diagnosis not present

## 2016-08-08 LAB — POCT INR: INR: 2.1

## 2016-08-22 ENCOUNTER — Encounter (HOSPITAL_COMMUNITY): Payer: Self-pay | Admitting: Psychiatry

## 2016-08-22 ENCOUNTER — Ambulatory Visit (INDEPENDENT_AMBULATORY_CARE_PROVIDER_SITE_OTHER): Payer: Medicare HMO | Admitting: Psychiatry

## 2016-08-22 VITALS — BP 126/72 | HR 55 | Ht 66.0 in | Wt 171.2 lb

## 2016-08-22 DIAGNOSIS — F321 Major depressive disorder, single episode, moderate: Secondary | ICD-10-CM

## 2016-08-22 DIAGNOSIS — Z8261 Family history of arthritis: Secondary | ICD-10-CM | POA: Diagnosis not present

## 2016-08-22 DIAGNOSIS — Z79899 Other long term (current) drug therapy: Secondary | ICD-10-CM

## 2016-08-22 DIAGNOSIS — Z8249 Family history of ischemic heart disease and other diseases of the circulatory system: Secondary | ICD-10-CM

## 2016-08-22 DIAGNOSIS — Z87891 Personal history of nicotine dependence: Secondary | ICD-10-CM

## 2016-08-22 DIAGNOSIS — Z823 Family history of stroke: Secondary | ICD-10-CM

## 2016-08-22 DIAGNOSIS — Z8371 Family history of colonic polyps: Secondary | ICD-10-CM

## 2016-08-22 DIAGNOSIS — Z803 Family history of malignant neoplasm of breast: Secondary | ICD-10-CM

## 2016-08-22 MED ORDER — DIVALPROEX SODIUM ER 250 MG PO TB24: ORAL_TABLET | ORAL | 5 refills | Status: DC

## 2016-08-22 MED ORDER — MIRTAZAPINE 30 MG PO TABS: 30.0000 mg | ORAL_TABLET | Freq: Every day | ORAL | 5 refills | Status: DC

## 2016-08-22 NOTE — Progress Notes (Signed)
Psychiatric Initial Adult Assessment   Patient Identification: Samuel Santiago MRN:  [id number] Date of Evaluation:  08/22/2016 Referral Source:Dr Plotnikov Chief Complaint:   Visit Diagnosis:   History of Present Illness:   Today the patient is seen with his wife [name]. The patient is distinctly improved. The target symptom of irritability is less. He is much less jealous and possessive of his wife. He is getting along much better with her. He no longer acknowledges any suicidal thinking. Generally he says his mood is good. He is sleeping at his baseline. His appetite and energy are good. He's thinking concentrating well. The patient is now more active. Back the wife that the patient says he is somewhat anxious. The patient is able to enjoy television watching game shows and he likes [place]. Generally the patient's memory seems to be pretty intact. He does aspirate. Questions but according to his wife there is little evidence of a distinct memory problem. The patient did stop the Abilify per our request and is better off of it. He no longer is drooling. He was sleeping excessively during the day and that is stopped. The patient is more active and energetic. On the other hand his wife describes him as being more emotional and often crying. The patient is somatic. He also is chronically suspicious. The patient shows no evidence of psychosis at this time. He is not suicidal Depression Symptoms:  anhedonia, (Hypo) Manic Symptoms:   Anxiety Symptoms:   Psychotic Symptoms:  Delusions, PTSD Symptoms:   Past Psychiatric History:   Previous Psychotropic Medications: Yes   Substance Abuse History in the last 12 months:  No.  Consequences of Substance Abuse:   Past Medical History:  Past Medical History:  Diagnosis Date  . Arthritis   . CHF (congestive heart failure) ([place])    EF 50-55% 2016  . Chronic anticoagulation   . Colon cancer ([place]) 1990   Had recurrence in 2003 resultinbg in  chemotherapy. He recently had a colonoscopy and  had a polyp removed that was benign.  . Coronary artery disease   . Dementia   . Dysrhythmia   . GERD (gastroesophageal reflux disease)   . History of colon polyps   . History of nephrolithiasis 01/23/2012  . Hyperlipidemia   . Hypertension   . Ischemic heart disease    has had remote MI in 1997 and was treated with TPA. Prior PCI to RCA with repeat PCI to the RCA in 2003  . Kidney stone    x11  . Myocardial infarction   . Normal nuclear stress test Feb 2012   No significant ischemia. EF 56%; with basal inferior scar  . PAF (paroxysmal atrial fibrillation) ([place])     Past Surgical History:  Procedure Laterality Date  . COLON SURGERY     x 2 for cancer  . COLONOSCOPY     colon cancer  . [place] & 2003   PCI to RCA x 2  . EYE SURGERY      Family Psychiatric History:   Family History:  Family History  Problem Relation Age of Onset  . Arthritis Sister   . Breast cancer Sister   . Congestive Heart Failure Mother   . Stroke Mother   . Heart disease Father   . Colon polyps Son   . Colon polyps Daughter   . Heart attack Brother     Social History:   Social History   Social History  . Marital status: Married  Spouse name: N/A  . Number of children: 3  . Years of education: N/A   Occupational History  . retired    Social History Main Topics  . Smoking status: Former Smoker    Quit date: 10/20/1985  . Smokeless tobacco: Never Used  . Alcohol use No  . Drug use: No  . Sexual activity: Not Asked   Other Topics Concern  . None   Social History Narrative  . None    Additional Social History:   Allergies:   Allergies  Allergen Reactions  . Tape Other (See Comments)    Pulled off skin one time--- "it really sticks to me" but will probably tolerate paper tape     Metabolic Disorder Labs: Lab Results  Component Value Date   HGBA1C 5.8 (H) 11/19/2015   MPG 120 11/19/2015   MPG 123  09/19/2015   No results found for: PROLACTIN Lab Results  Component Value Date   CHOL 148 05/08/2016   TRIG 121 05/08/2016   HDL 39 (L) 05/08/2016   CHOLHDL 3.8 05/08/2016   VLDL 24 05/08/2016   LDLCALC 85 05/08/2016   LDLCALC 75 01/30/2016     Current Medications: Current Outpatient Prescriptions  Medication Sig Dispense Refill  . acetaminophen (TYLENOL) 500 MG tablet Take 500 mg by mouth every 6 (six) hours as needed (pain).    Marland Kitchen aspirin 81 MG chewable tablet Chew 1 tablet (81 mg total) by mouth daily.    Marland Kitchen atorvastatin (LIPITOR) 10 MG tablet Take 1 tablet (10 mg total) by mouth daily. 90 tablet 3  . Cholecalciferol (VITAMIN D-3 PO) Take 1 tablet by mouth daily.     . divalproex (DEPAKOTE ER) 250 MG 24 hr tablet 1 qam for 1 week then 2  qam 60 tablet 5  . donepezil (ARICEPT) 5 MG tablet Take 5 mg by mouth at bedtime.    Marland Kitchen GLUCOSAMINE-CHONDROITIN PO Take 1 tablet by mouth daily.    . hydrochlorothiazide (HYDRODIURIL) 25 MG tablet Take 25 mg by mouth daily.    Marland Kitchen losartan (COZAAR) 100 MG tablet Take 1 tablet (100 mg total) by mouth daily. 90 tablet 3  . metoprolol tartrate (LOPRESSOR) 25 MG tablet TAKE ONE TABLET BY MOUTH TWICE DAILY 180 tablet 2  . Multiple Vitamin (MULTIVITAMIN WITH MINERALS) TABS tablet Take 1 tablet by mouth daily.    . Omega-3 Fatty Acids (FISH OIL PO) Take 1 capsule by mouth daily.    Marland Kitchen omeprazole (PRILOSEC) 20 MG capsule Take 20 mg by mouth daily.    Marland Kitchen omeprazole (PRILOSEC) 20 MG capsule Take 1 capsule (20 mg total) by mouth daily. Yearly physical due in Dec must see MD for refills 90 capsule 1  . polyvinyl alcohol-povidone (REFRESH) 1.4-0.6 % ophthalmic solution Place 1 drop into both eyes 4 (four) times daily as needed (dry eyes).     Marland Kitchen senna-docusate (SENOKOT S) 8.6-50 MG tablet Take 2 tablets by mouth at bedtime as needed for mild constipation. For constipation. Available over-the-counter. 30 tablet 3  . warfarin (COUMADIN) 1 MG tablet Take as directed by  coumadin clinic 40 tablet 2  . ARIPiprazole (ABILIFY) 5 MG tablet Take 5 mg by mouth daily.    . mirtazapine (REMERON) 30 MG tablet Take 1 tablet (30 mg total) by mouth at bedtime. 30 tablet 5   No current facility-administered medications for this visit.     Neurologic: Headache: No Seizure: No Paresthesias:No  Musculoskeletal: Strength & Muscle Tone: decreased Gait & Station: unsteady Patient  leans: N/A  Psychiatric Specialty Exam: ROS  Blood pressure 126/72, pulse (!) 55, height 5\' 6"  (1.676 m), weight 171 lb 3.2 oz (77.7 kg).Body mass index is 27.63 kg/m.  General Appearance: Casual  Eye Contact:  Fair  Speech:  Clear and Coherent  Volume:  Normal  Mood:  Depressed  Affect:  Appropriate    Orientation:  Full (Time, Place, and Person)  Thought Content:  WDL  Suicidal Thoughts:  No  Homicidal Thoughts:  No  Memory:  NA  Judgement:  Fair  Insight:  Lacking  Psychomotor Activity:  Decreased  Concentration:    Recall:  [place] of Knowledge:Fair  Language: Good  Akathisia:  No  Handed:  Right  AIMS (if indicated):    Assets:  Desire for Improvement  ADL's:  Impaired  Cognition: WNL  Sleep:      Treatment Plan Summary: At this time the patient overall is significantly improved. He started symptoms of irritability excessive joblessness almost to a point of being psychotic has gone. He is much improved. He takes a Depakote as scheduled. He is no longer controlling. He's had no falls. The patient does describe a sense that he feels he is getting older. He's going to turn 87 sitting. Overall he sleeping more normally. At this time given the fact that he is crying that he is excessively suspicious and somatic I will interpret this is likely a depressive process.I think the Depakote is been helpful for his irritability but I think at this time we'll go ahead and add Remeron 30 mg daily at bedtime. The family seems to understand this and is agreeing to take this medicine.  The patient clearly is improved. He'll return to see me in about 2-1/2 months.When his next visit we shall consider doing a Mini-Mental Status exam and looking at his diagnosis of dementia. Haskel Schroeder, MD 11/3/20179:11 AM

## 2016-08-26 ENCOUNTER — Other Ambulatory Visit: Payer: Self-pay | Admitting: Cardiology

## 2016-08-27 ENCOUNTER — Other Ambulatory Visit (INDEPENDENT_AMBULATORY_CARE_PROVIDER_SITE_OTHER): Payer: Medicare HMO

## 2016-08-27 ENCOUNTER — Ambulatory Visit (INDEPENDENT_AMBULATORY_CARE_PROVIDER_SITE_OTHER): Payer: Medicare HMO | Admitting: Internal Medicine

## 2016-08-27 ENCOUNTER — Encounter: Payer: Self-pay | Admitting: Internal Medicine

## 2016-08-27 VITALS — BP 118/74 | HR 55 | Wt 172.0 lb

## 2016-08-27 DIAGNOSIS — E785 Hyperlipidemia, unspecified: Secondary | ICD-10-CM | POA: Diagnosis not present

## 2016-08-27 DIAGNOSIS — I480 Paroxysmal atrial fibrillation: Secondary | ICD-10-CM

## 2016-08-27 DIAGNOSIS — R609 Edema, unspecified: Secondary | ICD-10-CM | POA: Insufficient documentation

## 2016-08-27 DIAGNOSIS — R600 Localized edema: Secondary | ICD-10-CM

## 2016-08-27 DIAGNOSIS — F22 Delusional disorders: Secondary | ICD-10-CM

## 2016-08-27 DIAGNOSIS — R4189 Other symptoms and signs involving cognitive functions and awareness: Secondary | ICD-10-CM

## 2016-08-27 DIAGNOSIS — F0281 Dementia in other diseases classified elsewhere with behavioral disturbance: Secondary | ICD-10-CM

## 2016-08-27 DIAGNOSIS — G301 Alzheimer's disease with late onset: Secondary | ICD-10-CM

## 2016-08-27 DIAGNOSIS — I10 Essential (primary) hypertension: Secondary | ICD-10-CM

## 2016-08-27 DIAGNOSIS — F02818 Dementia in other diseases classified elsewhere, unspecified severity, with other behavioral disturbance: Secondary | ICD-10-CM

## 2016-08-27 DIAGNOSIS — R69 Illness, unspecified: Secondary | ICD-10-CM | POA: Diagnosis not present

## 2016-08-27 DIAGNOSIS — R270 Ataxia, unspecified: Secondary | ICD-10-CM

## 2016-08-27 LAB — BASIC METABOLIC PANEL
BUN: 34 mg/dL — ABNORMAL HIGH (ref 6–23)
CALCIUM: 10 mg/dL (ref 8.4–10.5)
CHLORIDE: 105 meq/L (ref 96–112)
CO2: 34 meq/L — AB (ref 19–32)
Creatinine, Ser: 1.13 mg/dL (ref 0.40–1.50)
GFR: 65.56 mL/min (ref >60.00)
Glucose, Bld: 121 mg/dL — ABNORMAL HIGH (ref 70–99)
Potassium: 4.3 mEq/L (ref 3.5–5.1)
SODIUM: 143 meq/L (ref 135–145)

## 2016-08-27 NOTE — Assessment & Plan Note (Signed)
Dementia, age related Alzheimer's Aricept

## 2016-08-27 NOTE — Assessment & Plan Note (Signed)
Toprol, HCTZ, Losartan

## 2016-08-27 NOTE — Assessment & Plan Note (Signed)
Remeron, Depakote Dr Casimiro Needle

## 2016-08-27 NOTE — Assessment & Plan Note (Signed)
On Simvastatin 

## 2016-08-27 NOTE — Progress Notes (Signed)
Pre visit review using our clinic review tool, if applicable. No additional management support is needed unless otherwise documented below in the visit note. 

## 2016-08-27 NOTE — Assessment & Plan Note (Signed)
Tight belt/socks related 10/17 - resolved Discussed

## 2016-08-27 NOTE — Progress Notes (Signed)
Subjective:  Patient ID: KRISTIE GONZALO, male    DOB: April 05, 1929  Age: 82 y.o. MRN: [id number]  CC: No chief complaint on file.   HPI Terrelle Slingerland [name] presents for dyslipidemia, dementia, HTN f/u  Outpatient Medications Prior to Visit  Medication Sig Dispense Refill  . acetaminophen (TYLENOL) 500 MG tablet Take 500 mg by mouth every 6 (six) hours as needed (pain).    Marland Kitchen aspirin 81 MG chewable tablet Chew 1 tablet (81 mg total) by mouth daily.    Marland Kitchen atorvastatin (LIPITOR) 10 MG tablet Take 1 tablet (10 mg total) by mouth daily. 90 tablet 3  . Cholecalciferol (VITAMIN D-3 PO) Take 1 tablet by mouth daily.     . divalproex (DEPAKOTE ER) 250 MG 24 hr tablet 1 qam for 1 week then 2  qam 60 tablet 5  . donepezil (ARICEPT) 5 MG tablet Take 5 mg by mouth at bedtime.    Marland Kitchen GLUCOSAMINE-CHONDROITIN PO Take 1 tablet by mouth daily.    . hydrochlorothiazide (HYDRODIURIL) 25 MG tablet Take 25 mg by mouth daily.    Marland Kitchen losartan (COZAAR) 100 MG tablet Take 1 tablet (100 mg total) by mouth daily. 90 tablet 3  . metoprolol tartrate (LOPRESSOR) 25 MG tablet TAKE ONE TABLET BY MOUTH TWICE DAILY 180 tablet 2  . mirtazapine (REMERON) 30 MG tablet Take 1 tablet (30 mg total) by mouth at bedtime. 30 tablet 5  . Multiple Vitamin (MULTIVITAMIN WITH MINERALS) TABS tablet Take 1 tablet by mouth daily.    . Omega-3 Fatty Acids (FISH OIL PO) Take 1 capsule by mouth daily.    Marland Kitchen omeprazole (PRILOSEC) 20 MG capsule Take 20 mg by mouth daily.    . polyvinyl alcohol-povidone (REFRESH) 1.4-0.6 % ophthalmic solution Place 1 drop into both eyes 4 (four) times daily as needed (dry eyes).     Marland Kitchen senna-docusate (SENOKOT S) 8.6-50 MG tablet Take 2 tablets by mouth at bedtime as needed for mild constipation. For constipation. Available over-the-counter. 30 tablet 3  . warfarin (COUMADIN) 1 MG tablet TAKE AS DIRECTED BY COUMADIN CLINIC 40 tablet 3  . omeprazole (PRILOSEC) 20 MG capsule Take 1 capsule (20 mg total) by mouth daily.  Yearly physical due in Dec must see MD for refills 90 capsule 1  . ARIPiprazole (ABILIFY) 5 MG tablet Take 5 mg by mouth daily.     No facility-administered medications prior to visit.     ROS Review of Systems  Constitutional: Positive for fatigue. Negative for appetite change and unexpected weight change.  HENT: Negative for congestion, nosebleeds, sneezing, sore throat and trouble swallowing.   Eyes: Negative for itching and visual disturbance.  Respiratory: Negative for cough.   Cardiovascular: Positive for leg swelling. Negative for chest pain and palpitations.  Gastrointestinal: Negative for abdominal distention, blood in stool, diarrhea and nausea.  Genitourinary: Negative for frequency and hematuria.  Musculoskeletal: Positive for gait problem. Negative for back pain, joint swelling and neck pain.  Skin: Negative for rash.  Neurological: Negative for dizziness, tremors, speech difficulty and weakness.  Psychiatric/Behavioral: Positive for behavioral problems, confusion, decreased concentration and sleep disturbance. Negative for agitation, dysphoric mood and suicidal ideas. The patient is not nervous/anxious.     Objective:  BP 118/74   Pulse (!) 55   Wt 172 lb (78 kg)   SpO2 93%   BMI 27.76 kg/m   BP Readings from Last 3 Encounters:  08/27/16 118/74  05/19/16 116/68  05/09/16 135/68    Wt Readings  from Last 3 Encounters:  08/27/16 172 lb (78 kg)  05/19/16 181 lb 12.8 oz (82.5 kg)  05/08/16 180 lb 9.6 oz (81.9 kg)    Physical Exam  Constitutional: He is oriented to person, place, and time. He appears well-developed. No distress.  NAD  HENT:  Mouth/Throat: Oropharynx is clear and moist.  Eyes: Conjunctivae are normal. Pupils are equal, round, and reactive to light.  Neck: Normal range of motion. No JVD present. No thyromegaly present.  Cardiovascular: Exam reveals no gallop and no friction rub.   No murmur heard. Pulmonary/Chest: Effort normal and breath  sounds normal. No respiratory distress. He has no wheezes. He has no rales. He exhibits no tenderness.  Abdominal: Soft. Bowel sounds are normal. He exhibits no distension and no mass. There is no tenderness. There is no rebound and no guarding.  Musculoskeletal: Normal range of motion. He exhibits edema. He exhibits no tenderness.  Lymphadenopathy:    He has no cervical adenopathy.  Neurological: He is alert and oriented to person, place, and time. He has normal reflexes. No cranial nerve deficit. He exhibits normal muscle tone. He displays a negative Romberg sign. Coordination abnormal. Gait normal.  Skin: Skin is warm and dry. No rash noted.  Psychiatric: He has a normal mood and affect. His behavior is normal. Judgment and thought content normal.  in a w/c Alert, cooperative B LE trace edema irreg irreg HR, brady   Lab Results  Component Value Date   WBC 9.5 05/09/2016   HGB 14.4 05/09/2016   HCT 40.3 05/09/2016   PLT 144 (L) 05/09/2016   GLUCOSE 97 05/09/2016   CHOL 148 05/08/2016   TRIG 121 05/08/2016   HDL 39 (L) 05/08/2016   LDLCALC 85 05/08/2016   ALT 21 03/26/2016   AST 22 03/26/2016   NA 138 05/09/2016   K 4.0 05/09/2016   CL 105 05/09/2016   CREATININE 1.07 05/09/2016   BUN 20 05/09/2016   CO2 28 05/09/2016   TSH 0.972 05/09/2016   PSA 1.12 01/27/2013   INR 2.1 08/08/2016   HGBA1C 5.8 (H) 11/19/2015    Dg Chest 2 View  Result Date: 05/08/2016 CLINICAL DATA:  Shortness of breath for 3 days EXAM: CHEST  2 VIEW COMPARISON:  November 19, 2015 FINDINGS: There is no edema or consolidation. Heart is upper normal in size with pulmonary vascularity within normal limits. No adenopathy. There are foci of calcification in the aorta. Bones are osteoporotic. There is degenerative change in the left shoulder. IMPRESSION: No edema or consolidation. Stable cardiac silhouette. Aortic atherosclerosis. Bones osteoporotic. Electronically Signed   By: Lowella Grip III M.D.   On:  05/08/2016 11:15   Ct Angio Chest Pe W Or Wo Contrast  Result Date: 05/09/2016 CLINICAL DATA:  Hypoxia. EXAM: CT ANGIOGRAPHY CHEST WITH CONTRAST TECHNIQUE: Multidetector CT imaging of the chest was performed using the standard protocol during bolus administration of intravenous contrast. Multiplanar CT image reconstructions and MIPs were obtained to evaluate the vascular anatomy. CONTRAST:  100 cc Isovue 370 intravenous COMPARISON:  None. FINDINGS: Cardiovascular: Negative for pulmonary embolism. Normal heart size. No pericardial effusion. Aortic atherosclerosis. No evidence of acute aortic syndrome. Mediastinum: Small sliding hiatal hernia. Lungs/Pleura: There is no edema, consolidation, effusion, or pneumothorax. Diffuse airway thickening with occasional collapse or effacement of airways. There are 4 closely neighboring subpleural nodules along the upper major fissure on the right, measuring up to 7 mm (7 x 62mm) on 7:34. Upper abdomen: No acute findings. Cholelithiasis.  Large partly seen renal cysts. Musculoskeletal: No chest wall mass or suspicious bone lesions identified. Review of the MIP images confirms the above findings. IMPRESSION: 1. Negative for pulmonary embolism. 2. Bronchomalacia with multi focal airway collapse. Bronchial wall thickening. 3. Four subpleural pulmonary nodules in the right upper lobe measuring up to 7 mm. If appropriate for comorbidities, recommend chest CT in 3-6 months. 4. Cholelithiasis. Electronically Signed   By: Monte Fantasia M.D.   On: 05/09/2016 00:41    Assessment & Plan:   There are no diagnoses linked to this encounter. I am having Mr. Grindstaff maintain his multivitamin with minerals, Omega-3 Fatty Acids (FISH OIL PO), Cholecalciferol (VITAMIN D-3 PO), polyvinyl alcohol-povidone, omeprazole, losartan, GLUCOSAMINE-CHONDROITIN PO, acetaminophen, aspirin, atorvastatin, hydrochlorothiazide, senna-docusate, metoprolol tartrate, donepezil, ARIPiprazole, mirtazapine,  divalproex, warfarin, erythromycin, and DOCOSAHEXAENOIC ACID PO.  Meds ordered this encounter  Medications  . erythromycin ophthalmic ointment    Sig: as needed.  . DOCOSAHEXAENOIC ACID PO    Sig: Take 1 g by mouth daily.     Follow-up: No Follow-up on file.  Walker Kehr, MD

## 2016-08-27 NOTE — Assessment & Plan Note (Signed)
Coumadin, Toprol 

## 2016-08-29 ENCOUNTER — Ambulatory Visit (INDEPENDENT_AMBULATORY_CARE_PROVIDER_SITE_OTHER): Payer: Medicare HMO | Admitting: Pharmacist

## 2016-08-29 DIAGNOSIS — Z5181 Encounter for therapeutic drug level monitoring: Secondary | ICD-10-CM | POA: Diagnosis not present

## 2016-08-29 DIAGNOSIS — I4891 Unspecified atrial fibrillation: Secondary | ICD-10-CM | POA: Diagnosis not present

## 2016-08-29 DIAGNOSIS — Z7901 Long term (current) use of anticoagulants: Secondary | ICD-10-CM

## 2016-08-29 LAB — POCT INR: INR: 2.8

## 2016-09-22 ENCOUNTER — Ambulatory Visit: Payer: Self-pay | Admitting: Nurse Practitioner

## 2016-09-24 ENCOUNTER — Encounter: Payer: Self-pay | Admitting: Nurse Practitioner

## 2016-09-29 ENCOUNTER — Encounter: Payer: Self-pay | Admitting: Nurse Practitioner

## 2016-09-29 ENCOUNTER — Ambulatory Visit (INDEPENDENT_AMBULATORY_CARE_PROVIDER_SITE_OTHER): Payer: Medicare HMO | Admitting: Nurse Practitioner

## 2016-09-29 ENCOUNTER — Ambulatory Visit (INDEPENDENT_AMBULATORY_CARE_PROVIDER_SITE_OTHER): Payer: Medicare HMO | Admitting: *Deleted

## 2016-09-29 VITALS — BP 118/68 | HR 52 | Ht 66.0 in | Wt 170.8 lb

## 2016-09-29 DIAGNOSIS — Z7901 Long term (current) use of anticoagulants: Secondary | ICD-10-CM | POA: Diagnosis not present

## 2016-09-29 DIAGNOSIS — I259 Chronic ischemic heart disease, unspecified: Secondary | ICD-10-CM | POA: Diagnosis not present

## 2016-09-29 DIAGNOSIS — I480 Paroxysmal atrial fibrillation: Secondary | ICD-10-CM

## 2016-09-29 DIAGNOSIS — Z5181 Encounter for therapeutic drug level monitoring: Secondary | ICD-10-CM | POA: Diagnosis not present

## 2016-09-29 DIAGNOSIS — I10 Essential (primary) hypertension: Secondary | ICD-10-CM | POA: Diagnosis not present

## 2016-09-29 DIAGNOSIS — I4891 Unspecified atrial fibrillation: Secondary | ICD-10-CM | POA: Diagnosis not present

## 2016-09-29 LAB — BASIC METABOLIC PANEL
BUN: 31 mg/dL — ABNORMAL HIGH (ref 7–25)
CO2: 26 mmol/L (ref 20–31)
Calcium: 9.7 mg/dL (ref 8.6–10.3)
Chloride: 111 mmol/L — ABNORMAL HIGH (ref 98–110)
Creat: 1.1 mg/dL (ref 0.70–1.11)
Glucose, Bld: 90 mg/dL (ref 65–99)
Potassium: 4.3 mmol/L (ref 3.5–5.3)
Sodium: 149 mmol/L — ABNORMAL HIGH (ref 135–146)

## 2016-09-29 LAB — CBC
HCT: 42.3 % (ref 38.5–50.0)
Hemoglobin: 14.4 g/dL (ref 13.2–17.1)
MCH: 35 pg — ABNORMAL HIGH (ref 27.0–33.0)
MCHC: 34 g/dL (ref 32.0–36.0)
MCV: 102.9 fL — ABNORMAL HIGH (ref 80.0–100.0)
MPV: 10.6 fL (ref 7.5–12.5)
Platelets: 141 10*3/uL (ref 140–400)
RBC: 4.11 MIL/uL — ABNORMAL LOW (ref 4.20–5.80)
RDW: 13.4 % (ref 11.0–15.0)
WBC: 8.7 10*3/uL (ref 3.8–10.8)

## 2016-09-29 LAB — POCT INR: INR: 3

## 2016-09-29 MED ORDER — WARFARIN SODIUM 1 MG PO TABS: ORAL_TABLET | ORAL | 0 refills | Status: DC

## 2016-09-29 NOTE — Patient Instructions (Addendum)
We will be checking the following labs today - BMET, CBC   Medication Instructions:    Continue with your current medicines.     Testing/Procedures To Be Arranged:  N/A  Follow-Up:   See me in 6 months.     Other Special Instructions:   Continue to monitor your BP at home - call me if consistently staying below [id number] systolic.     If you need a refill on your cardiac medications before your next appointment, please call your pharmacy.   Call the [place] office at [phone] if you have any questions, problems or concerns.
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
## 2016-10-08 DIAGNOSIS — Z961 Presence of intraocular lens: Secondary | ICD-10-CM | POA: Diagnosis not present

## 2016-10-08 DIAGNOSIS — H02112 Cicatricial ectropion of right lower eyelid: Secondary | ICD-10-CM | POA: Diagnosis not present

## 2016-10-09 ENCOUNTER — Other Ambulatory Visit (INDEPENDENT_AMBULATORY_CARE_PROVIDER_SITE_OTHER): Payer: Medicare HMO

## 2016-10-09 ENCOUNTER — Encounter: Payer: Self-pay | Admitting: Internal Medicine

## 2016-10-09 ENCOUNTER — Ambulatory Visit (INDEPENDENT_AMBULATORY_CARE_PROVIDER_SITE_OTHER): Payer: Medicare HMO | Admitting: Internal Medicine

## 2016-10-09 DIAGNOSIS — R600 Localized edema: Secondary | ICD-10-CM

## 2016-10-09 DIAGNOSIS — F22 Delusional disorders: Secondary | ICD-10-CM | POA: Diagnosis not present

## 2016-10-09 DIAGNOSIS — E870 Hyperosmolality and hypernatremia: Secondary | ICD-10-CM

## 2016-10-09 DIAGNOSIS — I10 Essential (primary) hypertension: Secondary | ICD-10-CM

## 2016-10-09 DIAGNOSIS — F02818 Dementia in other diseases classified elsewhere, unspecified severity, with other behavioral disturbance: Secondary | ICD-10-CM

## 2016-10-09 DIAGNOSIS — R270 Ataxia, unspecified: Secondary | ICD-10-CM

## 2016-10-09 DIAGNOSIS — F0281 Dementia in other diseases classified elsewhere with behavioral disturbance: Secondary | ICD-10-CM

## 2016-10-09 DIAGNOSIS — R69 Illness, unspecified: Secondary | ICD-10-CM | POA: Diagnosis not present

## 2016-10-09 DIAGNOSIS — G301 Alzheimer's disease with late onset: Secondary | ICD-10-CM

## 2016-10-09 LAB — HEPATIC FUNCTION PANEL
ALT: 33 U/L (ref 0–53)
AST: 28 U/L (ref 0–37)
Albumin: 3.4 g/dL — ABNORMAL LOW (ref 3.5–5.2)
Alkaline Phosphatase: 48 U/L (ref 39–117)
Bilirubin, Direct: 0.1 mg/dL (ref 0.0–0.3)
TOTAL PROTEIN: 6.2 g/dL (ref 6.0–8.3)
Total Bilirubin: 0.8 mg/dL (ref 0.2–1.2)

## 2016-10-09 LAB — BASIC METABOLIC PANEL
BUN: 23 mg/dL (ref 6–23)
CHLORIDE: 104 meq/L (ref 96–112)
CO2: 35 mEq/L — ABNORMAL HIGH (ref 19–32)
CREATININE: 1.1 mg/dL (ref 0.40–1.50)
Calcium: 9.6 mg/dL (ref 8.4–10.5)
GFR: 67.6 mL/min (ref >60.00)
Glucose, Bld: 90 mg/dL (ref 70–99)
Potassium: 4.3 mEq/L (ref 3.5–5.1)
Sodium: 142 mEq/L (ref 135–145)

## 2016-10-09 LAB — TSH: TSH: 1.11 u[IU]/mL (ref 0.35–4.50)

## 2016-10-09 NOTE — Assessment & Plan Note (Addendum)
Mild - hydrate better May need to d/c HCTZ Labs

## 2016-10-09 NOTE — Assessment & Plan Note (Signed)
On Remerone, Depakote Labs

## 2016-10-09 NOTE — Assessment & Plan Note (Signed)
In a w/c 

## 2016-10-09 NOTE — Progress Notes (Signed)
Subjective:  Patient ID: Samuel Santiago, male    DOB: 1929-01-13  Age: 82 y.o. MRN: [id number]  CC: No chief complaint on file.   HPI Samuel Santiago [name] presents for elevated sodium on BMET - no sx's. F/u edema, dementia.  Outpatient Medications Prior to Visit  Medication Sig Dispense Refill  . acetaminophen (TYLENOL) 500 MG tablet Take 500 mg by mouth every 6 (six) hours as needed (pain).    Marland Kitchen aspirin 81 MG chewable tablet Chew 1 tablet (81 mg total) by mouth daily.    Marland Kitchen atorvastatin (LIPITOR) 10 MG tablet Take 1 tablet (10 mg total) by mouth daily. 90 tablet 3  . Cholecalciferol (VITAMIN D-3 PO) Take 1 tablet by mouth daily.     . divalproex (DEPAKOTE ER) 250 MG 24 hr tablet 1 qam for 1 week then 2  qam 60 tablet 5  . DOCOSAHEXAENOIC ACID PO Take 1 g by mouth daily.    Marland Kitchen donepezil (ARICEPT) 5 MG tablet Take 5 mg by mouth at bedtime.    Marland Kitchen erythromycin ophthalmic ointment as needed.    Marland Kitchen GLUCOSAMINE-CHONDROITIN PO Take 1 tablet by mouth daily.    . hydrochlorothiazide (HYDRODIURIL) 25 MG tablet Take 25 mg by mouth daily.    Marland Kitchen losartan (COZAAR) 100 MG tablet Take 1 tablet (100 mg total) by mouth daily. 90 tablet 3  . metoprolol tartrate (LOPRESSOR) 25 MG tablet TAKE ONE TABLET BY MOUTH TWICE DAILY 180 tablet 2  . mirtazapine (REMERON) 30 MG tablet Take 1 tablet (30 mg total) by mouth at bedtime. (Patient taking differently: Take 7.5 mg by mouth at bedtime. ) 30 tablet 5  . Multiple Vitamin (MULTIVITAMIN WITH MINERALS) TABS tablet Take 1 tablet by mouth daily.    . Omega-3 Fatty Acids (FISH OIL PO) Take 1 capsule by mouth daily.    Marland Kitchen omeprazole (PRILOSEC) 20 MG capsule Take 20 mg by mouth daily.    . polyvinyl alcohol-povidone (REFRESH) 1.4-0.6 % ophthalmic solution Place 1 drop into both eyes 4 (four) times daily as needed (dry eyes).     Marland Kitchen senna-docusate (SENOKOT S) 8.6-50 MG tablet Take 2 tablets by mouth at bedtime as needed for mild constipation. For constipation. Available  over-the-counter. 30 tablet 3  . warfarin (COUMADIN) 1 MG tablet Take as directed by coumadin clinic 120 tablet 0   No facility-administered medications prior to visit.     ROS Review of Systems  Constitutional: Positive for fatigue. Negative for appetite change and unexpected weight change.  HENT: Negative for congestion, nosebleeds, sneezing, sore throat and trouble swallowing.   Eyes: Negative for itching and visual disturbance.  Respiratory: Negative for cough.   Cardiovascular: Negative for chest pain, palpitations and leg swelling.  Gastrointestinal: Negative for abdominal distention, blood in stool, diarrhea and nausea.  Genitourinary: Negative for frequency and hematuria.  Musculoskeletal: Negative for back pain, gait problem, joint swelling and neck pain.  Skin: Negative for rash.  Neurological: Positive for weakness. Negative for dizziness, tremors and speech difficulty.  Psychiatric/Behavioral: Positive for confusion and decreased concentration. Negative for agitation, dysphoric mood and sleep disturbance. The patient is not nervous/anxious.     Objective:  BP 115/70 (BP Location: Left Arm, Patient Position: Sitting, Cuff Size: Normal)   Pulse (!) 57   Temp 98.5 F (36.9 C) (Oral)   Resp 12   Ht 5\' 6"  (1.676 m)   Wt 173 lb 12 oz (78.8 kg)   SpO2 96%   BMI 28.04 kg/m  BP Readings from Last 3 Encounters:  10/09/16 115/70  09/29/16 118/68  08/27/16 118/74    Wt Readings from Last 3 Encounters:  10/09/16 173 lb 12 oz (78.8 kg)  09/29/16 170 lb 12.8 oz (77.5 kg)  08/27/16 172 lb (78 kg)    Physical Exam  Constitutional: He is oriented to person, place, and time. He appears well-developed. No distress.  NAD  HENT:  Mouth/Throat: Oropharynx is clear and moist.  Eyes: Conjunctivae are normal. Pupils are equal, round, and reactive to light.  Neck: Normal range of motion. No JVD present. No thyromegaly present.  Cardiovascular: Normal rate, regular rhythm, normal  heart sounds and intact distal pulses.  Exam reveals no gallop and no friction rub.   No murmur heard. Pulmonary/Chest: Effort normal and breath sounds normal. No respiratory distress. He has no wheezes. He has no rales. He exhibits no tenderness.  Abdominal: Soft. Bowel sounds are normal. He exhibits no distension and no mass. There is no tenderness. There is no rebound and no guarding.  Musculoskeletal: Normal range of motion. He exhibits no edema or tenderness.  Lymphadenopathy:    He has no cervical adenopathy.  Neurological: He is alert and oriented to person, place, and time. He has normal reflexes. No cranial nerve deficit. He exhibits normal muscle tone. He displays a negative Romberg sign. Coordination abnormal. Gait normal.  Skin: Skin is warm and dry. No rash noted.  Psychiatric: He has a normal mood and affect. His behavior is normal.  trace LE edema B In a w/c  Lab Results  Component Value Date   WBC 8.7 09/29/2016   HGB 14.4 09/29/2016   HCT 42.3 09/29/2016   PLT 141 09/29/2016   GLUCOSE 90 09/29/2016   CHOL 148 05/08/2016   TRIG 121 05/08/2016   HDL 39 (L) 05/08/2016   LDLCALC 85 05/08/2016   ALT 21 03/26/2016   AST 22 03/26/2016   NA 149 (H) 09/29/2016   K 4.3 09/29/2016   CL 111 (H) 09/29/2016   CREATININE 1.10 09/29/2016   BUN 31 (H) 09/29/2016   CO2 26 09/29/2016   TSH 0.972 05/09/2016   PSA 1.12 01/27/2013   INR 3.0 09/29/2016   HGBA1C 5.8 (H) 11/19/2015    Dg Chest 2 View  Result Date: 05/08/2016 CLINICAL DATA:  Shortness of breath for 3 days EXAM: CHEST  2 VIEW COMPARISON:  November 19, 2015 FINDINGS: There is no edema or consolidation. Heart is upper normal in size with pulmonary vascularity within normal limits. No adenopathy. There are foci of calcification in the aorta. Bones are osteoporotic. There is degenerative change in the left shoulder. IMPRESSION: No edema or consolidation. Stable cardiac silhouette. Aortic atherosclerosis. Bones osteoporotic.  Electronically Signed   By: Lowella Grip III M.D.   On: 05/08/2016 11:15   Ct Angio Chest Pe W Or Wo Contrast  Result Date: 05/09/2016 CLINICAL DATA:  Hypoxia. EXAM: CT ANGIOGRAPHY CHEST WITH CONTRAST TECHNIQUE: Multidetector CT imaging of the chest was performed using the standard protocol during bolus administration of intravenous contrast. Multiplanar CT image reconstructions and MIPs were obtained to evaluate the vascular anatomy. CONTRAST:  100 cc Isovue 370 intravenous COMPARISON:  None. FINDINGS: Cardiovascular: Negative for pulmonary embolism. Normal heart size. No pericardial effusion. Aortic atherosclerosis. No evidence of acute aortic syndrome. Mediastinum: Small sliding hiatal hernia. Lungs/Pleura: There is no edema, consolidation, effusion, or pneumothorax. Diffuse airway thickening with occasional collapse or effacement of airways. There are 4 closely neighboring subpleural nodules along the upper  major fissure on the right, measuring up to 7 mm (7 x 65mm) on 7:34. Upper abdomen: No acute findings. Cholelithiasis. Large partly seen renal cysts. Musculoskeletal: No chest wall mass or suspicious bone lesions identified. Review of the MIP images confirms the above findings. IMPRESSION: 1. Negative for pulmonary embolism. 2. Bronchomalacia with multi focal airway collapse. Bronchial wall thickening. 3. Four subpleural pulmonary nodules in the right upper lobe measuring up to 7 mm. If appropriate for comorbidities, recommend chest CT in 3-6 months. 4. Cholelithiasis. Electronically Signed   By: Monte Fantasia M.D.   On: 05/09/2016 00:41    Assessment & Plan:   There are no diagnoses linked to this encounter. I am having Mr. Seawell maintain his multivitamin with minerals, Omega-3 Fatty Acids (FISH OIL PO), Cholecalciferol (VITAMIN D-3 PO), polyvinyl alcohol-povidone, omeprazole, losartan, GLUCOSAMINE-CHONDROITIN PO, acetaminophen, aspirin, atorvastatin, hydrochlorothiazide, senna-docusate,  metoprolol tartrate, donepezil, mirtazapine, divalproex, erythromycin, DOCOSAHEXAENOIC ACID PO, and warfarin.  No orders of the defined types were placed in this encounter.    Follow-up: No Follow-up on file.  Walker Kehr, MD

## 2016-10-09 NOTE — Assessment & Plan Note (Signed)
On HCTZ 

## 2016-10-09 NOTE — Progress Notes (Signed)
Pre visit review using our clinic review tool, if applicable. No additional management support is needed unless otherwise documented below in the visit note. 

## 2016-10-09 NOTE — Assessment & Plan Note (Signed)
On Depakote, remeron Labs

## 2016-10-09 NOTE — Assessment & Plan Note (Signed)
Toprol, Losartan and HCT NAS diet Labs

## 2016-10-18 ENCOUNTER — Telehealth: Payer: Self-pay

## 2016-10-18 NOTE — Telephone Encounter (Signed)
[facility] rq rf of donepezil 5 mg. Pls advise.

## 2016-10-19 NOTE — Telephone Encounter (Signed)
OK to fill this/these prescription(s) with additional refills x11  Thank you!  

## 2016-10-21 ENCOUNTER — Other Ambulatory Visit: Payer: Self-pay | Admitting: Geriatric Medicine

## 2016-10-21 MED ORDER — DONEPEZIL HCL 5 MG PO TABS: 5.0000 mg | ORAL_TABLET | Freq: Every day | ORAL | 11 refills | Status: AC

## 2016-10-21 NOTE — Telephone Encounter (Signed)
Sent to pharmacy 

## 2016-10-27 ENCOUNTER — Ambulatory Visit (INDEPENDENT_AMBULATORY_CARE_PROVIDER_SITE_OTHER): Payer: Medicare HMO | Admitting: *Deleted

## 2016-10-27 DIAGNOSIS — Z5181 Encounter for therapeutic drug level monitoring: Secondary | ICD-10-CM

## 2016-10-27 DIAGNOSIS — Z7901 Long term (current) use of anticoagulants: Secondary | ICD-10-CM

## 2016-10-27 DIAGNOSIS — I4891 Unspecified atrial fibrillation: Secondary | ICD-10-CM | POA: Diagnosis not present

## 2016-10-27 LAB — POCT INR: INR: 1.7

## 2016-10-30 ENCOUNTER — Other Ambulatory Visit: Payer: Self-pay | Admitting: Nurse Practitioner

## 2016-11-07 ENCOUNTER — Ambulatory Visit (HOSPITAL_COMMUNITY): Payer: Self-pay | Admitting: Psychiatry

## 2016-11-10 ENCOUNTER — Ambulatory Visit (INDEPENDENT_AMBULATORY_CARE_PROVIDER_SITE_OTHER): Payer: Medicare HMO

## 2016-11-10 DIAGNOSIS — Z5181 Encounter for therapeutic drug level monitoring: Secondary | ICD-10-CM | POA: Diagnosis not present

## 2016-11-10 DIAGNOSIS — Z7901 Long term (current) use of anticoagulants: Secondary | ICD-10-CM | POA: Diagnosis not present

## 2016-11-10 DIAGNOSIS — I4891 Unspecified atrial fibrillation: Secondary | ICD-10-CM | POA: Diagnosis not present

## 2016-11-10 LAB — POCT INR: INR: 3.3

## 2016-11-11 ENCOUNTER — Ambulatory Visit (HOSPITAL_COMMUNITY): Payer: Self-pay | Admitting: Psychiatry

## 2016-11-14 ENCOUNTER — Ambulatory Visit (INDEPENDENT_AMBULATORY_CARE_PROVIDER_SITE_OTHER): Payer: Medicare HMO | Admitting: Psychiatry

## 2016-11-14 ENCOUNTER — Encounter (HOSPITAL_COMMUNITY): Payer: Self-pay | Admitting: Psychiatry

## 2016-11-14 ENCOUNTER — Other Ambulatory Visit: Payer: Self-pay | Admitting: Nurse Practitioner

## 2016-11-14 VITALS — BP 122/76 | HR 52 | Ht 66.0 in | Wt 170.6 lb

## 2016-11-14 DIAGNOSIS — Z9889 Other specified postprocedural states: Secondary | ICD-10-CM

## 2016-11-14 DIAGNOSIS — Z8371 Family history of colonic polyps: Secondary | ICD-10-CM

## 2016-11-14 DIAGNOSIS — Z79899 Other long term (current) drug therapy: Secondary | ICD-10-CM

## 2016-11-14 DIAGNOSIS — Z8249 Family history of ischemic heart disease and other diseases of the circulatory system: Secondary | ICD-10-CM

## 2016-11-14 DIAGNOSIS — Z8261 Family history of arthritis: Secondary | ICD-10-CM | POA: Diagnosis not present

## 2016-11-14 DIAGNOSIS — Z803 Family history of malignant neoplasm of breast: Secondary | ICD-10-CM | POA: Diagnosis not present

## 2016-11-14 DIAGNOSIS — Z9109 Other allergy status, other than to drugs and biological substances: Secondary | ICD-10-CM

## 2016-11-14 DIAGNOSIS — Z87891 Personal history of nicotine dependence: Secondary | ICD-10-CM

## 2016-11-14 DIAGNOSIS — R69 Illness, unspecified: Secondary | ICD-10-CM | POA: Diagnosis not present

## 2016-11-14 DIAGNOSIS — Z823 Family history of stroke: Secondary | ICD-10-CM

## 2016-11-14 DIAGNOSIS — Z7982 Long term (current) use of aspirin: Secondary | ICD-10-CM

## 2016-11-14 DIAGNOSIS — F339 Major depressive disorder, recurrent, unspecified: Secondary | ICD-10-CM | POA: Diagnosis not present

## 2016-11-14 MED ORDER — DIVALPROEX SODIUM ER 250 MG PO TB24: ORAL_TABLET | ORAL | 5 refills | Status: DC

## 2016-11-14 MED ORDER — SERTRALINE HCL 100 MG PO TABS: ORAL_TABLET | ORAL | 5 refills | Status: DC

## 2016-11-14 NOTE — Progress Notes (Signed)
Psychiatric Initial Adult Assessment   Patient Identification: Samuel Santiago MRN:  [id number] Date of Evaluation:  11/14/2016 Referral Source:Dr Plotnikov Chief Complaint:   Visit Diagnosis:   Today the patient is doing fairly well. He is seen with his wife [name]. She says he is fairly stable. He continues to take Depakote but seems to help his mood state. She claims that she believes that he is still depressed. Her last visit we attempted to give him Remeron which oversedated him. The patient is only mildly suspicious these days. He is sleeping he is eating a no longer talks about suicide. Is a very sedentary lifestyle. He has a dog and likes to watch westerns on TV. He is in a wheelchair today. Overall though he does seem better. Unfortunately [name]'s been very sick over the last few months her cart surgery. Nonetheless the patient seems to got through it by having his kids help out a lot.  Depression Symptoms:  anhedonia, (Hypo) Manic Symptoms:   Anxiety Symptoms:   Psychotic Symptoms:  Delusions, PTSD Symptoms:   Past Psychiatric History:   Previous Psychotropic Medications: Yes   Substance Abuse History in the last 12 months:  No.  Consequences of Substance Abuse:   Past Medical History:  Past Medical History:  Diagnosis Date  . Arthritis   . CHF (congestive heart failure) ([place])    EF 50-55% 2016  . Chronic anticoagulation   . Colon cancer ([place]) 1990   Had recurrence in 2003 resultinbg in chemotherapy. He recently had a colonoscopy and  had a polyp removed that was benign.  . Coronary artery disease   . Dementia   . Dysrhythmia   . GERD (gastroesophageal reflux disease)   . History of colon polyps   . History of nephrolithiasis 01/23/2012  . Hyperlipidemia   . Hypertension   . Ischemic heart disease    has had remote MI in 1997 and was treated with TPA. Prior PCI to RCA with repeat PCI to the RCA in 2003  . Kidney stone    x11  . Myocardial infarction   . Normal  nuclear stress test Feb 2012   No significant ischemia. EF 56%; with basal inferior scar  . PAF (paroxysmal atrial fibrillation) ([place])     Past Surgical History:  Procedure Laterality Date  . COLON SURGERY     x 2 for cancer  . COLONOSCOPY     colon cancer  . [place] & 2003   PCI to RCA x 2  . EYE SURGERY      Family Psychiatric History:   Family History:  Family History  Problem Relation Age of Onset  . Arthritis Sister   . Breast cancer Sister   . Congestive Heart Failure Mother   . Stroke Mother   . Heart disease Father   . Colon polyps Son   . Colon polyps Daughter   . Heart attack Brother     Social History:   Social History   Social History  . Marital status: Married    Spouse name: N/A  . Number of children: 3  . Years of education: N/A   Occupational History  . retired    Social History Main Topics  . Smoking status: Former Smoker    Quit date: 10/20/1985  . Smokeless tobacco: Never Used  . Alcohol use No  . Drug use: No  . Sexual activity: Not Asked   Other Topics Concern  . None   Social  History Narrative  . None    Additional Social History:   Allergies:   Allergies  Allergen Reactions  . Tape Other (See Comments)    Pulled off skin one time--- "it really sticks to me" but will probably tolerate paper tape     Metabolic Disorder Labs: Lab Results  Component Value Date   HGBA1C 5.8 (H) 11/19/2015   MPG 120 11/19/2015   MPG 123 09/19/2015   No results found for: PROLACTIN Lab Results  Component Value Date   CHOL 148 05/08/2016   TRIG 121 05/08/2016   HDL 39 (L) 05/08/2016   CHOLHDL 3.8 05/08/2016   VLDL 24 05/08/2016   LDLCALC 85 05/08/2016   LDLCALC 75 01/30/2016     Current Medications: Current Outpatient Prescriptions  Medication Sig Dispense Refill  . erythromycin ophthalmic ointment as needed.    Marland Kitchen GLUCOSAMINE-CHONDROITIN PO Take 1 tablet by mouth daily.    . hydrochlorothiazide (HYDRODIURIL)  25 MG tablet Take 25 mg by mouth daily.    Marland Kitchen losartan (COZAAR) 100 MG tablet TAKE 1 TABLET BY MOUTH DAILY 90 tablet 3  . metoprolol tartrate (LOPRESSOR) 25 MG tablet TAKE ONE TABLET BY MOUTH TWICE DAILY 180 tablet 2  . Multiple Vitamin (MULTIVITAMIN WITH MINERALS) TABS tablet Take 1 tablet by mouth daily.    . Omega-3 Fatty Acids (FISH OIL PO) Take 1 capsule by mouth daily.    Marland Kitchen omeprazole (PRILOSEC) 20 MG capsule Take 20 mg by mouth daily.    . polyvinyl alcohol-povidone (REFRESH) 1.4-0.6 % ophthalmic solution Place 1 drop into both eyes 4 (four) times daily as needed (dry eyes).     Marland Kitchen senna-docusate (SENOKOT S) 8.6-50 MG tablet Take 2 tablets by mouth at bedtime as needed for mild constipation. For constipation. Available over-the-counter. 30 tablet 3  . warfarin (COUMADIN) 1 MG tablet Take as directed by coumadin clinic 120 tablet 0  . acetaminophen (TYLENOL) 500 MG tablet Take 500 mg by mouth every 6 (six) hours as needed (pain).    Marland Kitchen aspirin 81 MG chewable tablet Chew 1 tablet (81 mg total) by mouth daily.    Marland Kitchen atorvastatin (LIPITOR) 10 MG tablet Take 1 tablet (10 mg total) by mouth daily. 90 tablet 3  . Cholecalciferol (VITAMIN D-3 PO) Take 1 tablet by mouth daily.     . divalproex (DEPAKOTE ER) 250 MG 24 hr tablet 1 qam for 1 week then 2  qam 60 tablet 5  . DOCOSAHEXAENOIC ACID PO Take 1 g by mouth daily.    Marland Kitchen donepezil (ARICEPT) 5 MG tablet Take 1 tablet (5 mg total) by mouth at bedtime. 30 tablet 11  . sertraline (ZOLOFT) 100 MG tablet Half a pill qam  For 6 days then 1  qam 30 tablet 5   No current facility-administered medications for this visit.     Neurologic: Headache: No Seizure: No Paresthesias:No  Musculoskeletal: Strength & Muscle Tone: decreased Gait & Station: unsteady Patient leans: N/A  Psychiatric Specialty Exam: ROS  Blood pressure 122/76, pulse (!) 52, height 5\' 6"  (1.676 m), weight 170 lb 9.6 oz (77.4 kg).Body mass index is 27.54 kg/m.  General Appearance:  Casual  Eye Contact:  Fair  Speech:  Clear and Coherent  Volume:  Normal  Mood:  Depressed  Affect:  Appropriate    Orientation:  Full (Time, Place, and Person)  Thought Content:  WDL  Suicidal Thoughts:  No  Homicidal Thoughts:  No  Memory:  NA  Judgement:  Fair  Insight:  Lacking  Psychomotor Activity:  Decreased  Concentration:    Recall:  [place] of Knowledge:Fair  Language: Good  Akathisia:  No  Handed:  Right  AIMS (if indicated):    Assets:  Desire for Improvement  ADL's:  Impaired  Cognition: WNL  Sleep:      Treatment Plan Summary: Today the patient is doing fairly well. At this time we'll go ahead and start him on Zoloft for depression. His wife is certain that he really is depressed and does cry more than he should. They still some degree of suspiciousness. He has few other vegetative symptoms at this time. I think the patient is not agitated and I think the Depakote is very helpful. He demonstrates no psychosis. He is not suicidal. He'll return to see me in approximately 2 months. He is functioning fairly well. He denies any physical complaints. He denies chest pain shortness of breath or any neurological symptoms at this time. Haskel Schroeder, MD 1/26/201811:55 AM

## 2016-11-21 DIAGNOSIS — Z993 Dependence on wheelchair: Secondary | ICD-10-CM | POA: Diagnosis not present

## 2016-11-25 ENCOUNTER — Encounter (HOSPITAL_COMMUNITY): Payer: Self-pay | Admitting: Emergency Medicine

## 2016-11-25 ENCOUNTER — Emergency Department (HOSPITAL_COMMUNITY)
Admission: EM | Admit: 2016-11-25 | Discharge: 2016-11-25 | Disposition: A | Discharge status: Home / Self Care | Payer: Medicare HMO | Attending: Emergency Medicine | Admitting: Emergency Medicine

## 2016-11-25 ENCOUNTER — Emergency Department (HOSPITAL_COMMUNITY): Payer: Medicare HMO

## 2016-11-25 DIAGNOSIS — I252 Old myocardial infarction: Secondary | ICD-10-CM | POA: Insufficient documentation

## 2016-11-25 DIAGNOSIS — Y92009 Unspecified place in unspecified non-institutional (private) residence as the place of occurrence of the external cause: Secondary | ICD-10-CM | POA: Diagnosis not present

## 2016-11-25 DIAGNOSIS — Y9301 Activity, walking, marching and hiking: Secondary | ICD-10-CM | POA: Diagnosis not present

## 2016-11-25 DIAGNOSIS — Z955 Presence of coronary angioplasty implant and graft: Secondary | ICD-10-CM | POA: Insufficient documentation

## 2016-11-25 DIAGNOSIS — S199XXA Unspecified injury of neck, initial encounter: Secondary | ICD-10-CM | POA: Diagnosis not present

## 2016-11-25 DIAGNOSIS — Z8673 Personal history of transient ischemic attack (TIA), and cerebral infarction without residual deficits: Secondary | ICD-10-CM | POA: Insufficient documentation

## 2016-11-25 DIAGNOSIS — Z87891 Personal history of nicotine dependence: Secondary | ICD-10-CM | POA: Insufficient documentation

## 2016-11-25 DIAGNOSIS — R930 Abnormal findings on diagnostic imaging of skull and head, not elsewhere classified: Secondary | ICD-10-CM | POA: Insufficient documentation

## 2016-11-25 DIAGNOSIS — S32020A Wedge compression fracture of second lumbar vertebra, initial encounter for closed fracture: Secondary | ICD-10-CM | POA: Diagnosis not present

## 2016-11-25 DIAGNOSIS — R259 Unspecified abnormal involuntary movements: Secondary | ICD-10-CM | POA: Diagnosis not present

## 2016-11-25 DIAGNOSIS — I110 Hypertensive heart disease with heart failure: Secondary | ICD-10-CM | POA: Diagnosis not present

## 2016-11-25 DIAGNOSIS — W19XXXA Unspecified fall, initial encounter: Secondary | ICD-10-CM | POA: Insufficient documentation

## 2016-11-25 DIAGNOSIS — S3992XA Unspecified injury of lower back, initial encounter: Secondary | ICD-10-CM | POA: Diagnosis present

## 2016-11-25 DIAGNOSIS — I509 Heart failure, unspecified: Secondary | ICD-10-CM | POA: Insufficient documentation

## 2016-11-25 DIAGNOSIS — Z85038 Personal history of other malignant neoplasm of large intestine: Secondary | ICD-10-CM | POA: Insufficient documentation

## 2016-11-25 DIAGNOSIS — I2510 Atherosclerotic heart disease of native coronary artery without angina pectoris: Secondary | ICD-10-CM | POA: Diagnosis not present

## 2016-11-25 DIAGNOSIS — S0990XA Unspecified injury of head, initial encounter: Secondary | ICD-10-CM | POA: Diagnosis not present

## 2016-11-25 DIAGNOSIS — Z7901 Long term (current) use of anticoagulants: Secondary | ICD-10-CM | POA: Diagnosis not present

## 2016-11-25 DIAGNOSIS — Z7982 Long term (current) use of aspirin: Secondary | ICD-10-CM | POA: Insufficient documentation

## 2016-11-25 DIAGNOSIS — M549 Dorsalgia, unspecified: Secondary | ICD-10-CM | POA: Diagnosis not present

## 2016-11-25 DIAGNOSIS — Y999 Unspecified external cause status: Secondary | ICD-10-CM | POA: Diagnosis not present

## 2016-11-25 DIAGNOSIS — S299XXA Unspecified injury of thorax, initial encounter: Secondary | ICD-10-CM | POA: Diagnosis not present

## 2016-11-25 LAB — BASIC METABOLIC PANEL
Anion gap: 8 (ref 5–15)
BUN: 21 mg/dL — AB (ref 6–20)
CHLORIDE: 98 mmol/L — AB (ref 101–111)
CO2: 28 mmol/L (ref 22–32)
CREATININE: 1 mg/dL (ref 0.61–1.24)
Calcium: 9.4 mg/dL (ref 8.9–10.3)
GFR calc Af Amer: >60 mL/min (ref >60)
GFR calc non Af Amer: >60 mL/min (ref >60)
GLUCOSE: 119 mg/dL — AB (ref 65–99)
POTASSIUM: 3.7 mmol/L (ref 3.5–5.1)
SODIUM: 134 mmol/L — AB (ref 135–145)

## 2016-11-25 LAB — CBC WITH DIFFERENTIAL/PLATELET
BASOS ABS: 0 10*3/uL (ref 0.0–0.1)
Basophils Relative: 0 %
Eosinophils Absolute: 0.2 10*3/uL (ref 0.0–0.7)
Eosinophils Relative: 2 %
HCT: 40.2 % (ref 39.0–52.0)
HEMOGLOBIN: 14.4 g/dL (ref 13.0–17.0)
LYMPHS ABS: 0.8 10*3/uL (ref 0.7–4.0)
LYMPHS PCT: 7 %
MCH: 35.6 pg — ABNORMAL HIGH (ref 26.0–34.0)
MCHC: 35.8 g/dL (ref 30.0–36.0)
MCV: 99.3 fL (ref 78.0–100.0)
Monocytes Absolute: 1.5 10*3/uL — ABNORMAL HIGH (ref 0.1–1.0)
Monocytes Relative: 13 %
Neutro Abs: 8.9 10*3/uL — ABNORMAL HIGH (ref 1.7–7.7)
Neutrophils Relative %: 78 %
Platelets: 119 10*3/uL — ABNORMAL LOW (ref 150–400)
RBC: 4.05 MIL/uL — AB (ref 4.22–5.81)
RDW: 13.1 % (ref 11.5–15.5)
WBC: 11.3 10*3/uL — ABNORMAL HIGH (ref 4.0–10.5)

## 2016-11-25 LAB — PROTIME-INR
INR: 2.09
Prothrombin Time: 23.8 seconds — ABNORMAL HIGH (ref 11.4–15.2)

## 2016-11-25 MED ORDER — HYDROCODONE-ACETAMINOPHEN 5-325 MG PO TABS: 1.0000 | ORAL_TABLET | ORAL | 0 refills | Status: DC | PRN

## 2016-11-25 MED ORDER — HYDROCODONE-ACETAMINOPHEN 5-325 MG PO TABS
2.0000 | ORAL_TABLET | Freq: Once | ORAL | Status: AC
  Administered 2016-11-25: 2 via ORAL
  Filled 2016-11-25: qty 2

## 2016-11-25 NOTE — ED Notes (Signed)
Bed: [id number] Expected date:  Expected time:  Means of arrival:  Comments: Ems

## 2016-11-25 NOTE — ED Triage Notes (Signed)
Per [facility] pt from home , fall 3 days ago , co back pain . sts unable to stand straight this Am . Per EMS pt was unable to ambulate .

## 2016-11-25 NOTE — ED Provider Notes (Signed)
[facility] DEPT Provider Note   CSN: [id number] Arrival date & time: 11/25/16 [id number]     History    Chief Complaint  Patient presents with  . Fall    back pain      HPI Samuel Santiago [name] is a 83 y.o. male.  83yo M w/ extensive PMH below including CAD, CHF, A fib on coumadin who p/w back pain. 3 days ago the patient fell while walking at home. He denies any head injury or loss of consciousness but began having low back pain after the fall. The pain has gotten progressively worse and was so severe this morning that he could not get out of bed and needed EMS assistance to get here. He denies any leg weakness, numbness, saddle anesthesia, or bowel/bladder incontinence. He denies any other extremity injuries from the fall. He has taken tylenol with little relief. No chest wall pain, abd pain, or SOB.  Past Medical History:  Diagnosis Date  . Arthritis   . CHF (congestive heart failure) ([place])    EF 50-55% 2016  . Chronic anticoagulation   . Colon cancer ([place]) 1990   Had recurrence in 2003 resultinbg in chemotherapy. He recently had a colonoscopy and  had a polyp removed that was benign.  . Coronary artery disease   . Dementia   . Dysrhythmia   . GERD (gastroesophageal reflux disease)   . History of colon polyps   . History of nephrolithiasis 01/23/2012  . Hyperlipidemia   . Hypertension   . Ischemic heart disease    has had remote MI in 1997 and was treated with TPA. Prior PCI to RCA with repeat PCI to the RCA in 2003  . Kidney stone    x11  . Myocardial infarction   . Normal nuclear stress test Feb 2012   No significant ischemia. EF 56%; with basal inferior scar  . PAF (paroxysmal atrial fibrillation) [facility])      Patient Active Problem List   Diagnosis Date Noted  . Hypernatremia 10/09/2016  . Edema 08/27/2016  . Pulmonary nodules 05/09/2016  . Weakness 05/08/2016  . Dyspnea 05/08/2016  . Paranoid delusion ([place]) 03/26/2016  . Dementia 11/19/2015  . TIA (transient  ischemic attack) 11/19/2015  . Leukocytosis   . HLD (hyperlipidemia)   . Receptive aphasia 11/18/2015  . Anticoagulation management encounter 10/16/2015  . Atrial fibrillation with rapid ventricular response ([place])   . New onset atrial fibrillation ([place]) 09/18/2015  . Atrial fibrillation ([place]) 09/18/2015  . Abrasion of arm, left 04/19/2015  . Cognitive deficits 04/19/2015  . Memory loss 03/31/2014  . Asthmatic bronchitis 03/17/2014  . Stress 07/26/2012  . Insomnia 03/24/2012  . History of nephrolithiasis 01/23/2012  . Macrocytosis 06/02/2011  . Ataxia 06/02/2011  . BLEPHARITIS 12/02/2010  . CYSTITIS 12/02/2010  . CBC, ABNORMAL 08/07/2009  . TOBACCO USE, QUIT 08/07/2009  . Dyslipidemia 12/06/2007  . Essential hypertension 12/06/2007  . Coronary atherosclerosis 12/06/2007  . Rosacea 12/06/2007  . Osteoarthritis 12/06/2007  . COLON CANCER, HX OF 12/06/2007  . COLONIC POLYPS, HX OF 12/06/2007    Past Surgical History:  Procedure Laterality Date  . COLON SURGERY     x 2 for cancer  . COLONOSCOPY     colon cancer  . [place] & 2003   PCI to RCA x 2  . EYE SURGERY          Home Medications    Prior to Admission medications   Medication Sig Start Date End  Date Taking? Authorizing Provider  acetaminophen (TYLENOL) 500 MG tablet Take 500 mg by mouth every 6 (six) hours as needed (pain).    Historical Provider, MD  aspirin 81 MG chewable tablet Chew 1 tablet (81 mg total) by mouth daily. 11/20/15   Shanker Kristeen Mans, MD  atorvastatin (LIPITOR) 10 MG tablet Take 1 tablet (10 mg total) by mouth daily. 12/12/15   Burtis Junes, NP  Cholecalciferol (VITAMIN D-3 PO) Take 1 tablet by mouth daily.     Historical Provider, MD  divalproex (DEPAKOTE ER) 250 MG 24 hr tablet 1 qam for 1 week then 2  qam 11/14/16   Norma Fredrickson, MD  DOCOSAHEXAENOIC ACID PO Take 1 g by mouth daily.    Historical Provider, MD  donepezil (ARICEPT) 5 MG tablet Take 1 tablet (5 mg  total) by mouth at bedtime. 10/21/16   Cassandria Anger, MD  erythromycin ophthalmic ointment as needed. 02/20/15   Historical Provider, MD  GLUCOSAMINE-CHONDROITIN PO Take 1 tablet by mouth daily.    Historical Provider, MD  hydrochlorothiazide (HYDRODIURIL) 25 MG tablet Take 25 mg by mouth daily.    Historical Provider, MD  hydrochlorothiazide (HYDRODIURIL) 25 MG tablet TAKE 1 TABLET BY MOUTH ONCE DAILY 11/17/16   Burtis Junes, NP  losartan (COZAAR) 100 MG tablet TAKE 1 TABLET BY MOUTH DAILY 10/31/16   Burtis Junes, NP  metoprolol tartrate (LOPRESSOR) 25 MG tablet TAKE ONE TABLET BY MOUTH TWICE DAILY 05/16/16   Larey Dresser, MD  Multiple Vitamin (MULTIVITAMIN WITH MINERALS) TABS tablet Take 1 tablet by mouth daily.    Historical Provider, MD  Omega-3 Fatty Acids (FISH OIL PO) Take 1 capsule by mouth daily.    Historical Provider, MD  omeprazole (PRILOSEC) 20 MG capsule Take 20 mg by mouth daily.    Historical Provider, MD  polyvinyl alcohol-povidone (REFRESH) 1.4-0.6 % ophthalmic solution Place 1 drop into both eyes 4 (four) times daily as needed (dry eyes).     Historical Provider, MD  senna-docusate (SENOKOT S) 8.6-50 MG tablet Take 2 tablets by mouth at bedtime as needed for mild constipation. For constipation. Available over-the-counter. 05/09/16   Ripudeep Krystal Eaton, MD  sertraline (ZOLOFT) 100 MG tablet Half a pill qam  For 6 days then 1  qam 11/14/16   Norma Fredrickson, MD  warfarin (COUMADIN) 1 MG tablet Take as directed by coumadin clinic 09/29/16   Larey Dresser, MD      Family History  Problem Relation Age of Onset  . Arthritis Sister   . Breast cancer Sister   . Congestive Heart Failure Mother   . Stroke Mother   . Heart disease Father   . Colon polyps Son   . Colon polyps Daughter   . Heart attack Brother      Social History  Substance Use Topics  . Smoking status: Former Smoker    Quit date: 10/20/1985  . Smokeless tobacco: Never Used  . Alcohol use No      Allergies     Tape    Review of Systems  10 Systems reviewed and are negative for acute change except as noted in the HPI.   Physical Exam Updated Vital Signs BP 129/70   Pulse 61   Temp 98.2 F (36.8 C) (Oral)   Resp 15   SpO2 96%   Physical Exam  Constitutional: He is oriented to person, place, and time. He appears well-developed and well-nourished. No distress.  HENT:  Head: Normocephalic and  atraumatic.  Moist mucous membranes  Eyes: Conjunctivae are normal. Pupils are equal, round, and reactive to light.  Neck: Neck supple.  Cardiovascular: Normal rate, regular rhythm and normal heart sounds.   No murmur heard. Pulmonary/Chest: Effort normal and breath sounds normal. He exhibits no tenderness.  Abdominal: Soft. Bowel sounds are normal. He exhibits no distension. There is no tenderness.  Musculoskeletal: He exhibits no edema or deformity.  Mild tenderness of midline lower thoracic/upper lumbar spine w/ no stepoff or ecchymosis  Neurological: He is alert and oriented to person, place, and time. No sensory deficit.  Fluent speech 5/5 strength x all 4 ext  Skin: Skin is warm and dry.  Psychiatric: He has a normal mood and affect. Judgment normal.  Nursing note and vitals reviewed.     ED Treatments / Results  Labs (all labs ordered are listed, but only abnormal results are displayed) Labs Reviewed  BASIC METABOLIC PANEL - Abnormal; Notable for the following:       Result Value   Sodium 134 (*)    Chloride 98 (*)    Glucose, Bld 119 (*)    BUN 21 (*)    All other components within normal limits  CBC WITH DIFFERENTIAL/PLATELET - Abnormal; Notable for the following:    WBC 11.3 (*)    RBC 4.05 (*)    MCH 35.6 (*)    Platelets 119 (*)    Neutro Abs 8.9 (*)    Monocytes Absolute 1.5 (*)    All other components within normal limits  PROTIME-INR - Abnormal; Notable for the following:    Prothrombin Time 23.8 (*)    All other components within normal  limits     EKG  EKG Interpretation  Date/Time:    Ventricular Rate:    PR Interval:    QRS Duration:   QT Interval:    QTC Calculation:   R Axis:     Text Interpretation:           Radiology Ct Head Wo Contrast  Result Date: 11/25/2016 CLINICAL DATA:  83 year old male fell 3 days ago. Initial encounter. EXAM: CT HEAD WITHOUT CONTRAST CT CERVICAL SPINE WITHOUT CONTRAST TECHNIQUE: Multidetector CT imaging of the head and cervical spine was performed following the standard protocol without intravenous contrast. Multiplanar CT image reconstructions of the cervical spine were also generated. COMPARISON:  11/18/2015 limited brain MR. 11/18/2015 head CT. FINDINGS: CT HEAD FINDINGS Brain: No intracranial hemorrhage or CT evidence of large acute infarct. Prominent chronic microvascular changes. Global atrophy. Ventricular prominence unchanged and may be related to atrophy rather hydrocephalus. Aqueduct is patent. Vascular: Ectatic vasculature partially calcified. Skull: No skull fracture. Sinuses/Orbits: Post lens replacement without acute orbital abnormality. Mild mucosal thickening ethmoid sinus air cells with remainder of visualized paranasal sinuses, mastoid air cells and middle ear cavities clear. Other: Negative. CT CERVICAL SPINE FINDINGS Alignment: Minimal curvature. Skull base and vertebrae: No cervical spine fracture. Soft tissues and spinal canal: No abnormal prevertebral soft tissue swelling. Disc levels: Multilevel cervical spondylotic changes. Most notable finding is focal bony overgrowth arising from the right C5-6 facet joint projecting into the canal with slight flattening of the right aspect of the cord. Upper chest: No active pneumothorax or mass. Other: Ectatic carotid arteries with impression upon the posterior pharynx. IMPRESSION: CT HEAD No skull fracture or intracranial hemorrhage. Prominent chronic microvascular changes. Global atrophy. Ventricular prominence unchanged and may  be related to atrophy rather than hydrocephalus. Ectatic vasculature partially calcified. CT CERVICAL SPINE No  cervical spine fracture or abnormal prevertebral soft tissue swelling. Multilevel cervical spondylotic changes. Most notable finding is focal bony overgrowth arising from the right C5-6 facet joint projecting into the canal with slight flattening of the right aspect of the cord. Electronically Signed   By: Genia Del M.D.   On: 11/25/2016 09:58   Ct Cervical Spine Wo Contrast  Result Date: 11/25/2016 CLINICAL DATA:  83 year old male fell 3 days ago. Initial encounter. EXAM: CT HEAD WITHOUT CONTRAST CT CERVICAL SPINE WITHOUT CONTRAST TECHNIQUE: Multidetector CT imaging of the head and cervical spine was performed following the standard protocol without intravenous contrast. Multiplanar CT image reconstructions of the cervical spine were also generated. COMPARISON:  11/18/2015 limited brain MR. 11/18/2015 head CT. FINDINGS: CT HEAD FINDINGS Brain: No intracranial hemorrhage or CT evidence of large acute infarct. Prominent chronic microvascular changes. Global atrophy. Ventricular prominence unchanged and may be related to atrophy rather hydrocephalus. Aqueduct is patent. Vascular: Ectatic vasculature partially calcified. Skull: No skull fracture. Sinuses/Orbits: Post lens replacement without acute orbital abnormality. Mild mucosal thickening ethmoid sinus air cells with remainder of visualized paranasal sinuses, mastoid air cells and middle ear cavities clear. Other: Negative. CT CERVICAL SPINE FINDINGS Alignment: Minimal curvature. Skull base and vertebrae: No cervical spine fracture. Soft tissues and spinal canal: No abnormal prevertebral soft tissue swelling. Disc levels: Multilevel cervical spondylotic changes. Most notable finding is focal bony overgrowth arising from the right C5-6 facet joint projecting into the canal with slight flattening of the right aspect of the cord. Upper chest: No active  pneumothorax or mass. Other: Ectatic carotid arteries with impression upon the posterior pharynx. IMPRESSION: CT HEAD No skull fracture or intracranial hemorrhage. Prominent chronic microvascular changes. Global atrophy. Ventricular prominence unchanged and may be related to atrophy rather than hydrocephalus. Ectatic vasculature partially calcified. CT CERVICAL SPINE No cervical spine fracture or abnormal prevertebral soft tissue swelling. Multilevel cervical spondylotic changes. Most notable finding is focal bony overgrowth arising from the right C5-6 facet joint projecting into the canal with slight flattening of the right aspect of the cord. Electronically Signed   By: Genia Del M.D.   On: 11/25/2016 09:58   Ct Thoracic Spine Wo Contrast  Result Date: 11/25/2016 CLINICAL DATA:  Back pain since a fall at home 3 days ago. EXAM: CT THORACIC AND LUMBAR SPINE WITHOUT CONTRAST TECHNIQUE: Multidetector CT imaging of the thoracic and lumbar spine was performed without contrast. Multiplanar CT image reconstructions were also generated. COMPARISON:  CT scan of the abdomen dated 05/27/2013 and chest CT dated 05/09/2016 FINDINGS: CT THORACIC SPINE FINDINGS Alignment: There is slight straightening of the lower thoracic kyphosis with a minimal anterolisthesis of T10 on T11. Vertebrae: No acute fracture or focal pathologic process. 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 Paraspinal and other soft tissues: 18 mm calcified gallstone, essentially unchanged. Multiple large and small cysts on both kidneys. Aortic atherosclerosis. Coronary artery calcifications. Moderate hiatal hernia. Disc levels: There is no appreciable disc bulging or protrusion in the thoracic spine. CT LUMBAR SPINE FINDINGS Segmentation: 5 lumbar type vertebrae. Alignment: 2 mm spondylolisthesis at L4-5.  Otherwise normal. Vertebrae: Subtle new probable acute compression fracture of the superior aspect of the L2 vertebral body. No  protrusion of bone or disc material into the spinal canal. Paraspinal and other soft tissues: Aortic atherosclerosis. Multiple renal cysts. Disc levels: T12-L1: Calcification in the disc. No disc bulging or protrusion. L1-2: Normal disc. Subtle compression fracture of the superior aspect of L2. L2-3: Normal disc.  Slight calcification of the right ligamentum flavum. L3-4: Small broad-based disc bulge. Hypertrophy of the ligamentum flavum combine to create mild spinal stenosis. L4-5: Small broad-based soft disc protrusion with disc degeneration. Hypertrophy of the ligamentum flavum and facet joints combine to create severe spinal stenosis. No foraminal stenosis. L5-S1: Minimal disc bulge into the left slight bilateral facet arthritis. IMPRESSION: CT THORACIC SPINE IMPRESSION No significant abnormality of the thoracic spine. 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 CT LUMBAR SPINE IMPRESSION Subtle acute benign-appearing compression fracture of the L2 vertebral body. Severe spinal stenosis at L4-5. Electronically Signed   By: Lorriane Shire M.D.   On: 11/25/2016 10:14   Ct Lumbar Spine Wo Contrast  Result Date: 11/25/2016 CLINICAL DATA:  Back pain since a fall at home 3 days ago. EXAM: CT THORACIC AND LUMBAR SPINE WITHOUT CONTRAST TECHNIQUE: Multidetector CT imaging of the thoracic and lumbar spine was performed without contrast. Multiplanar CT image reconstructions were also generated. COMPARISON:  CT scan of the abdomen dated 05/27/2013 and chest CT dated 05/09/2016 FINDINGS: CT THORACIC SPINE FINDINGS Alignment: There is slight straightening of the lower thoracic kyphosis with a minimal anterolisthesis of T10 on T11. Vertebrae: No acute fracture or focal pathologic process. 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 Paraspinal and other soft tissues: 18 mm calcified gallstone, essentially unchanged. Multiple large and small cysts on both kidneys. Aortic atherosclerosis. Coronary  artery calcifications. Moderate hiatal hernia. Disc levels: There is no appreciable disc bulging or protrusion in the thoracic spine. CT LUMBAR SPINE FINDINGS Segmentation: 5 lumbar type vertebrae. Alignment: 2 mm spondylolisthesis at L4-5.  Otherwise normal. Vertebrae: Subtle new probable acute compression fracture of the superior aspect of the L2 vertebral body. No protrusion of bone or disc material into the spinal canal. Paraspinal and other soft tissues: Aortic atherosclerosis. Multiple renal cysts. Disc levels: T12-L1: Calcification in the disc. No disc bulging or protrusion. L1-2: Normal disc. Subtle compression fracture of the superior aspect of L2. L2-3: Normal disc. Slight calcification of the right ligamentum flavum. L3-4: Small broad-based disc bulge. Hypertrophy of the ligamentum flavum combine to create mild spinal stenosis. L4-5: Small broad-based soft disc protrusion with disc degeneration. Hypertrophy of the ligamentum flavum and facet joints combine to create severe spinal stenosis. No foraminal stenosis. L5-S1: Minimal disc bulge into the left slight bilateral facet arthritis. IMPRESSION: CT THORACIC SPINE IMPRESSION No significant abnormality of the thoracic spine. 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 CT LUMBAR SPINE IMPRESSION Subtle acute benign-appearing compression fracture of the L2 vertebral body. Severe spinal stenosis at L4-5. Electronically Signed   By: Lorriane Shire M.D.   On: 11/25/2016 10:14    Procedures Procedures (including critical care time) Procedures  Medications Ordered in ED  Medications  HYDROcodone-acetaminophen (NORCO/VICODIN) 5-325 MG per tablet 2 tablet (2 tablets Oral Given 11/25/16 [id number])     Initial Impression / Assessment and Plan / ED Course  I have reviewed the triage vital signs and the nursing notes.  Pertinent labs & imaging results that were available during my care of the patient were reviewed by me and considered in my medical  decision making (see chart for details).     Pt w/ worsening back pain since fall 3 days ago. HE was neurologically intact on exam, no signs/sx to suggest cauda equina. Obtained basic lab work which shows normal hemoglobin, INR 2. Obtained CT of head and C-spine as well as CT of thoracic and lumbar because of the patient's Coumadin  use. CT head and C-spine negative acute. CT of back shows compression fracture of L2 without any other acute findings. Given the location of his pain I suspect that the compression fracture is the source. Gave the patient Vicodin and ambulated in the ED. Pt does have walker at home which he can use as needed. Provided with short course of pain medication to use as needed, cautioned on side effects.  Reviewed return precautions including any anesthesia, leg weakness, or new incontinence. Instructed to follow-up with PCP within one week for reassessment of his pain and functioning at home. Patient and wife voiced understanding and patient discharged in satisfactory condition.  Final Clinical Impressions(s) / ED Diagnoses   Final diagnoses:  Closed compression fracture of second lumbar vertebra, initial encounter [facility])     New Prescriptions   No medications on file       Sharlett Iles, MD 11/29/16 1535

## 2016-11-25 NOTE — ED Notes (Signed)
Bed: [id number] Expected date:  Expected time:  Means of arrival:  Comments: EMS flu

## 2016-11-26 DIAGNOSIS — R69 Illness, unspecified: Secondary | ICD-10-CM | POA: Diagnosis not present

## 2016-11-27 ENCOUNTER — Encounter: Payer: Self-pay | Admitting: Internal Medicine

## 2016-11-27 ENCOUNTER — Ambulatory Visit (INDEPENDENT_AMBULATORY_CARE_PROVIDER_SITE_OTHER): Payer: Medicare HMO | Admitting: Internal Medicine

## 2016-11-27 DIAGNOSIS — S32020D Wedge compression fracture of second lumbar vertebra, subsequent encounter for fracture with routine healing: Secondary | ICD-10-CM

## 2016-11-27 DIAGNOSIS — M48061 Spinal stenosis, lumbar region without neurogenic claudication: Secondary | ICD-10-CM | POA: Insufficient documentation

## 2016-11-27 DIAGNOSIS — I10 Essential (primary) hypertension: Secondary | ICD-10-CM

## 2016-11-27 DIAGNOSIS — R270 Ataxia, unspecified: Secondary | ICD-10-CM | POA: Diagnosis not present

## 2016-11-27 DIAGNOSIS — S32020A Wedge compression fracture of second lumbar vertebra, initial encounter for closed fracture: Secondary | ICD-10-CM | POA: Insufficient documentation

## 2016-11-27 DIAGNOSIS — G301 Alzheimer's disease with late onset: Secondary | ICD-10-CM | POA: Diagnosis not present

## 2016-11-27 DIAGNOSIS — F02818 Alzheimer's disease with late onset: Secondary | ICD-10-CM

## 2016-11-27 DIAGNOSIS — F0281 Dementia in other diseases classified elsewhere with behavioral disturbance: Secondary | ICD-10-CM

## 2016-11-27 MED ORDER — HYDROCODONE-ACETAMINOPHEN 5-325 MG PO TABS: 1.0000 | ORAL_TABLET | Freq: Four times a day (QID) | ORAL | 0 refills | Status: DC | PRN

## 2016-11-27 NOTE — Assessment & Plan Note (Signed)
Severe  Discussed

## 2016-11-27 NOTE — Assessment & Plan Note (Addendum)
Lift chair PT/OT Norco w/caution  Potential benefits of opioids use as well as potential risks (i.e. addiction risk, apnea etc) and complications (i.e. Somnolence, constipation and others) were explained to the patient and were aknowledged.

## 2016-11-27 NOTE — Progress Notes (Signed)
Subjective:  Patient ID: Samuel Santiago, male    DOB: 1928-11-19  Age: 83 y.o. MRN: [id number]  CC: No chief complaint on file.   HPI Samuel Santiago [name] presents for a fall f/u on Sunday. C/o LBP Went to ER on Mon - L2 compr fx - subacute on CT  Outpatient Medications Prior to Visit  Medication Sig Dispense Refill  . acetaminophen (TYLENOL) 500 MG tablet Take 1,000 mg by mouth every 6 (six) hours as needed (pain).     Marland Kitchen aspirin 81 MG chewable tablet Chew 1 tablet (81 mg total) by mouth daily.    Marland Kitchen atorvastatin (LIPITOR) 10 MG tablet Take 1 tablet (10 mg total) by mouth daily. 90 tablet 3  . Cholecalciferol (VITAMIN D-3 PO) Take 1 tablet by mouth daily.     . divalproex (DEPAKOTE ER) 250 MG 24 hr tablet 1 qam for 1 week then 2  qam (Patient taking differently: Take 500 mg by mouth daily with breakfast. ) 60 tablet 5  . DOCOSAHEXAENOIC ACID PO Take 1 g by mouth daily.    Marland Kitchen donepezil (ARICEPT) 5 MG tablet Take 1 tablet (5 mg total) by mouth at bedtime. 30 tablet 11  . erythromycin ophthalmic ointment Place 1 application into both eyes as needed (for infection of the eyes).     Marland Kitchen GLUCOSAMINE-CHONDROITIN PO Take 1 tablet by mouth daily.    . hydrochlorothiazide (HYDRODIURIL) 25 MG tablet TAKE 1 TABLET BY MOUTH ONCE DAILY 90 tablet 3  . HYDROcodone-acetaminophen (NORCO/VICODIN) 5-325 MG tablet Take 1-2 tablets by mouth every 4 (four) hours as needed. 15 tablet 0  . losartan (COZAAR) 100 MG tablet TAKE 1 TABLET BY MOUTH DAILY 90 tablet 3  . metoprolol tartrate (LOPRESSOR) 25 MG tablet TAKE ONE TABLET BY MOUTH TWICE DAILY 180 tablet 2  . Multiple Vitamin (MULTIVITAMIN WITH MINERALS) TABS tablet Take 1 tablet by mouth daily.    . Omega-3 Fatty Acids (FISH OIL PO) Take 1 capsule by mouth daily.    Marland Kitchen omeprazole (PRILOSEC) 20 MG capsule Take 20 mg by mouth daily.    . polyvinyl alcohol-povidone (REFRESH) 1.4-0.6 % ophthalmic solution Place 1 drop into both eyes 4 (four) times daily as needed (dry  eyes).     Marland Kitchen senna-docusate (SENOKOT S) 8.6-50 MG tablet Take 2 tablets by mouth at bedtime as needed for mild constipation. For constipation. Available over-the-counter. 30 tablet 3  . sertraline (ZOLOFT) 100 MG tablet Half a pill qam  For 6 days then 1  qam (Patient taking differently: Take 100 mg by mouth daily with breakfast. ) 30 tablet 5  . warfarin (COUMADIN) 1 MG tablet Take as directed by coumadin clinic (Patient taking differently: Take 1.5 mg by mouth every other day. Take as directed by coumadin clinic) 120 tablet 0   No facility-administered medications prior to visit.     ROS Review of Systems  Constitutional: Negative for appetite change, fatigue and unexpected weight change.  HENT: Negative for congestion, nosebleeds, sneezing, sore throat and trouble swallowing.   Eyes: Negative for itching and visual disturbance.  Respiratory: Negative for cough.   Cardiovascular: Negative for chest pain, palpitations and leg swelling.  Gastrointestinal: Negative for abdominal distention, blood in stool, diarrhea and nausea.  Genitourinary: Negative for frequency and hematuria.  Musculoskeletal: Positive for back pain and gait problem. Negative for joint swelling and neck pain.  Skin: Negative for rash.  Neurological: Negative for dizziness, tremors, speech difficulty and weakness.  Psychiatric/Behavioral: Positive for confusion.  Negative for agitation, dysphoric mood and sleep disturbance. The patient is not nervous/anxious.     Objective:  BP 138/78   Pulse (!) 52   Temp 98 F (36.7 C) (Oral)   Resp 20   Wt 170 lb (77.1 kg)   SpO2 95%   BMI 27.44 kg/m   BP Readings from Last 3 Encounters:  11/27/16 138/78  11/25/16 156/67  10/09/16 115/70    Wt Readings from Last 3 Encounters:  11/27/16 170 lb (77.1 kg)  10/09/16 173 lb 12 oz (78.8 kg)  09/29/16 170 lb 12.8 oz (77.5 kg)    Physical Exam  Constitutional: He is oriented to person, place, and time. He appears  well-developed. No distress.  NAD  HENT:  Mouth/Throat: Oropharynx is clear and moist.  Eyes: Conjunctivae are normal. Pupils are equal, round, and reactive to light.  Neck: Normal range of motion. No JVD present. No thyromegaly present.  Cardiovascular: Normal rate, regular rhythm, normal heart sounds and intact distal pulses.  Exam reveals no gallop and no friction rub.   No murmur heard. Pulmonary/Chest: Effort normal and breath sounds normal. No respiratory distress. He has no wheezes. He has no rales. He exhibits no tenderness.  Abdominal: Soft. Bowel sounds are normal. He exhibits no distension and no mass. There is no tenderness. There is no rebound and no guarding.  Musculoskeletal: Normal range of motion. He exhibits tenderness. He exhibits no edema.  Lymphadenopathy:    He has no cervical adenopathy.  Neurological: He is alert and oriented to person, place, and time. He has normal reflexes. No cranial nerve deficit. He exhibits normal muscle tone. He displays a negative Romberg sign. Coordination abnormal. Gait normal.  Skin: Skin is warm and dry. No rash noted.  Psychiatric: His behavior is normal.    Lab Results  Component Value Date   WBC 11.3 (H) 11/25/2016   HGB 14.4 11/25/2016   HCT 40.2 11/25/2016   PLT 119 (L) 11/25/2016   GLUCOSE 119 (H) 11/25/2016   CHOL 148 05/08/2016   TRIG 121 05/08/2016   HDL 39 (L) 05/08/2016   LDLCALC 85 05/08/2016   ALT 33 10/09/2016   AST 28 10/09/2016   NA 134 (L) 11/25/2016   K 3.7 11/25/2016   CL 98 (L) 11/25/2016   CREATININE 1.00 11/25/2016   BUN 21 (H) 11/25/2016   CO2 28 11/25/2016   TSH 1.11 10/09/2016   PSA 1.12 01/27/2013   INR 2.09 11/25/2016   HGBA1C 5.8 (H) 11/19/2015    Ct Head Wo Contrast  Result Date: 11/25/2016 CLINICAL DATA:  83 year old male fell 3 days ago. Initial encounter. EXAM: CT HEAD WITHOUT CONTRAST CT CERVICAL SPINE WITHOUT CONTRAST TECHNIQUE: Multidetector CT imaging of the head and cervical spine  was performed following the standard protocol without intravenous contrast. Multiplanar CT image reconstructions of the cervical spine were also generated. COMPARISON:  11/18/2015 limited brain MR. 11/18/2015 head CT. FINDINGS: CT HEAD FINDINGS Brain: No intracranial hemorrhage or CT evidence of large acute infarct. Prominent chronic microvascular changes. Global atrophy. Ventricular prominence unchanged and may be related to atrophy rather hydrocephalus. Aqueduct is patent. Vascular: Ectatic vasculature partially calcified. Skull: No skull fracture. Sinuses/Orbits: Post lens replacement without acute orbital abnormality. Mild mucosal thickening ethmoid sinus air cells with remainder of visualized paranasal sinuses, mastoid air cells and middle ear cavities clear. Other: Negative. CT CERVICAL SPINE FINDINGS Alignment: Minimal curvature. Skull base and vertebrae: No cervical spine fracture. Soft tissues and spinal canal: No abnormal prevertebral soft  tissue swelling. Disc levels: Multilevel cervical spondylotic changes. Most notable finding is focal bony overgrowth arising from the right C5-6 facet joint projecting into the canal with slight flattening of the right aspect of the cord. Upper chest: No active pneumothorax or mass. Other: Ectatic carotid arteries with impression upon the posterior pharynx. IMPRESSION: CT HEAD No skull fracture or intracranial hemorrhage. Prominent chronic microvascular changes. Global atrophy. Ventricular prominence unchanged and may be related to atrophy rather than hydrocephalus. Ectatic vasculature partially calcified. CT CERVICAL SPINE No cervical spine fracture or abnormal prevertebral soft tissue swelling. Multilevel cervical spondylotic changes. Most notable finding is focal bony overgrowth arising from the right C5-6 facet joint projecting into the canal with slight flattening of the right aspect of the cord. Electronically Signed   By: Genia Del M.D.   On: 11/25/2016 09:58     Ct Cervical Spine Wo Contrast  Result Date: 11/25/2016 CLINICAL DATA:  83 year old male fell 3 days ago. Initial encounter. EXAM: CT HEAD WITHOUT CONTRAST CT CERVICAL SPINE WITHOUT CONTRAST TECHNIQUE: Multidetector CT imaging of the head and cervical spine was performed following the standard protocol without intravenous contrast. Multiplanar CT image reconstructions of the cervical spine were also generated. COMPARISON:  11/18/2015 limited brain MR. 11/18/2015 head CT. FINDINGS: CT HEAD FINDINGS Brain: No intracranial hemorrhage or CT evidence of large acute infarct. Prominent chronic microvascular changes. Global atrophy. Ventricular prominence unchanged and may be related to atrophy rather hydrocephalus. Aqueduct is patent. Vascular: Ectatic vasculature partially calcified. Skull: No skull fracture. Sinuses/Orbits: Post lens replacement without acute orbital abnormality. Mild mucosal thickening ethmoid sinus air cells with remainder of visualized paranasal sinuses, mastoid air cells and middle ear cavities clear. Other: Negative. CT CERVICAL SPINE FINDINGS Alignment: Minimal curvature. Skull base and vertebrae: No cervical spine fracture. Soft tissues and spinal canal: No abnormal prevertebral soft tissue swelling. Disc levels: Multilevel cervical spondylotic changes. Most notable finding is focal bony overgrowth arising from the right C5-6 facet joint projecting into the canal with slight flattening of the right aspect of the cord. Upper chest: No active pneumothorax or mass. Other: Ectatic carotid arteries with impression upon the posterior pharynx. IMPRESSION: CT HEAD No skull fracture or intracranial hemorrhage. Prominent chronic microvascular changes. Global atrophy. Ventricular prominence unchanged and may be related to atrophy rather than hydrocephalus. Ectatic vasculature partially calcified. CT CERVICAL SPINE No cervical spine fracture or abnormal prevertebral soft tissue swelling. Multilevel  cervical spondylotic changes. Most notable finding is focal bony overgrowth arising from the right C5-6 facet joint projecting into the canal with slight flattening of the right aspect of the cord. Electronically Signed   By: Genia Del M.D.   On: 11/25/2016 09:58   Ct Thoracic Spine Wo Contrast  Result Date: 11/25/2016 CLINICAL DATA:  Back pain since a fall at home 3 days ago. EXAM: CT THORACIC AND LUMBAR SPINE WITHOUT CONTRAST TECHNIQUE: Multidetector CT imaging of the thoracic and lumbar spine was performed without contrast. Multiplanar CT image reconstructions were also generated. COMPARISON:  CT scan of the abdomen dated 05/27/2013 and chest CT dated 05/09/2016 FINDINGS: CT THORACIC SPINE FINDINGS Alignment: There is slight straightening of the lower thoracic kyphosis with a minimal anterolisthesis of T10 on T11. Vertebrae: No acute fracture or focal pathologic process. 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 Paraspinal and other soft tissues: 18 mm calcified gallstone, essentially unchanged. Multiple large and small cysts on both kidneys. Aortic atherosclerosis. Coronary artery calcifications. Moderate hiatal hernia. Disc levels: There is no appreciable disc  bulging or protrusion in the thoracic spine. CT LUMBAR SPINE FINDINGS Segmentation: 5 lumbar type vertebrae. Alignment: 2 mm spondylolisthesis at L4-5.  Otherwise normal. Vertebrae: Subtle new probable acute compression fracture of the superior aspect of the L2 vertebral body. No protrusion of bone or disc material into the spinal canal. Paraspinal and other soft tissues: Aortic atherosclerosis. Multiple renal cysts. Disc levels: T12-L1: Calcification in the disc. No disc bulging or protrusion. L1-2: Normal disc. Subtle compression fracture of the superior aspect of L2. L2-3: Normal disc. Slight calcification of the right ligamentum flavum. L3-4: Small broad-based disc bulge. Hypertrophy of the ligamentum flavum combine to  create mild spinal stenosis. L4-5: Small broad-based soft disc protrusion with disc degeneration. Hypertrophy of the ligamentum flavum and facet joints combine to create severe spinal stenosis. No foraminal stenosis. L5-S1: Minimal disc bulge into the left slight bilateral facet arthritis. IMPRESSION: CT THORACIC SPINE IMPRESSION No significant abnormality of the thoracic spine. 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 CT LUMBAR SPINE IMPRESSION Subtle acute benign-appearing compression fracture of the L2 vertebral body. Severe spinal stenosis at L4-5. Electronically Signed   By: Lorriane Shire M.D.   On: 11/25/2016 10:14   Ct Lumbar Spine Wo Contrast  Result Date: 11/25/2016 CLINICAL DATA:  Back pain since a fall at home 3 days ago. EXAM: CT THORACIC AND LUMBAR SPINE WITHOUT CONTRAST TECHNIQUE: Multidetector CT imaging of the thoracic and lumbar spine was performed without contrast. Multiplanar CT image reconstructions were also generated. COMPARISON:  CT scan of the abdomen dated 05/27/2013 and chest CT dated 05/09/2016 FINDINGS: CT THORACIC SPINE FINDINGS Alignment: There is slight straightening of the lower thoracic kyphosis with a minimal anterolisthesis of T10 on T11. Vertebrae: No acute fracture or focal pathologic process. 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 Paraspinal and other soft tissues: 18 mm calcified gallstone, essentially unchanged. Multiple large and small cysts on both kidneys. Aortic atherosclerosis. Coronary artery calcifications. Moderate hiatal hernia. Disc levels: There is no appreciable disc bulging or protrusion in the thoracic spine. CT LUMBAR SPINE FINDINGS Segmentation: 5 lumbar type vertebrae. Alignment: 2 mm spondylolisthesis at L4-5.  Otherwise normal. Vertebrae: Subtle new probable acute compression fracture of the superior aspect of the L2 vertebral body. No protrusion of bone or disc material into the spinal canal. Paraspinal and other  soft tissues: Aortic atherosclerosis. Multiple renal cysts. Disc levels: T12-L1: Calcification in the disc. No disc bulging or protrusion. L1-2: Normal disc. Subtle compression fracture of the superior aspect of L2. L2-3: Normal disc. Slight calcification of the right ligamentum flavum. L3-4: Small broad-based disc bulge. Hypertrophy of the ligamentum flavum combine to create mild spinal stenosis. L4-5: Small broad-based soft disc protrusion with disc degeneration. Hypertrophy of the ligamentum flavum and facet joints combine to create severe spinal stenosis. No foraminal stenosis. L5-S1: Minimal disc bulge into the left slight bilateral facet arthritis. IMPRESSION: CT THORACIC SPINE IMPRESSION No significant abnormality of the thoracic spine. 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 CT LUMBAR SPINE IMPRESSION Subtle acute benign-appearing compression fracture of the L2 vertebral body. Severe spinal stenosis at L4-5. Electronically Signed   By: Lorriane Shire M.D.   On: 11/25/2016 10:14    Assessment & Plan:   There are no diagnoses linked to this encounter. I am having Mr. Shutt maintain his multivitamin with minerals, Omega-3 Fatty Acids (FISH OIL PO), Cholecalciferol (VITAMIN D-3 PO), polyvinyl alcohol-povidone, omeprazole, GLUCOSAMINE-CHONDROITIN PO, acetaminophen, aspirin, atorvastatin, senna-docusate, metoprolol tartrate, erythromycin, DOCOSAHEXAENOIC ACID PO, warfarin, donepezil,  losartan, sertraline, divalproex, hydrochlorothiazide, and HYDROcodone-acetaminophen.  No orders of the defined types were placed in this encounter.    Follow-up: No Follow-up on file.  Walker Kehr, MD

## 2016-11-27 NOTE — Assessment & Plan Note (Signed)
On Aricept 

## 2016-11-27 NOTE — Assessment & Plan Note (Signed)
HCTZ Toprol Losartan

## 2016-11-27 NOTE — Progress Notes (Signed)
Pre visit review using our clinic review tool, if applicable. No additional management support is needed unless otherwise documented below in the visit note. 

## 2016-11-27 NOTE — Assessment & Plan Note (Signed)
OT/PT

## 2016-12-04 ENCOUNTER — Emergency Department (HOSPITAL_COMMUNITY): Payer: Medicare HMO

## 2016-12-04 ENCOUNTER — Encounter (HOSPITAL_COMMUNITY): Payer: Self-pay | Admitting: Emergency Medicine

## 2016-12-04 ENCOUNTER — Emergency Department (HOSPITAL_COMMUNITY)
Admission: EM | Admit: 2016-12-04 | Discharge: 2016-12-05 | Disposition: A | Discharge status: Skilled Nursing Facility | Payer: Medicare HMO | Attending: Emergency Medicine | Admitting: Emergency Medicine

## 2016-12-04 ENCOUNTER — Telehealth: Payer: Self-pay | Admitting: Internal Medicine

## 2016-12-04 DIAGNOSIS — I499 Cardiac arrhythmia, unspecified: Secondary | ICD-10-CM | POA: Insufficient documentation

## 2016-12-04 DIAGNOSIS — R2689 Other abnormalities of gait and mobility: Secondary | ICD-10-CM | POA: Diagnosis not present

## 2016-12-04 DIAGNOSIS — M545 Low back pain: Secondary | ICD-10-CM | POA: Diagnosis not present

## 2016-12-04 DIAGNOSIS — R262 Difficulty in walking, not elsewhere classified: Secondary | ICD-10-CM

## 2016-12-04 DIAGNOSIS — I110 Hypertensive heart disease with heart failure: Secondary | ICD-10-CM | POA: Insufficient documentation

## 2016-12-04 DIAGNOSIS — S32028A Other fracture of second lumbar vertebra, initial encounter for closed fracture: Secondary | ICD-10-CM | POA: Insufficient documentation

## 2016-12-04 DIAGNOSIS — X58XXXA Exposure to other specified factors, initial encounter: Secondary | ICD-10-CM | POA: Diagnosis not present

## 2016-12-04 DIAGNOSIS — Z7901 Long term (current) use of anticoagulants: Secondary | ICD-10-CM | POA: Diagnosis not present

## 2016-12-04 DIAGNOSIS — I2510 Atherosclerotic heart disease of native coronary artery without angina pectoris: Secondary | ICD-10-CM | POA: Insufficient documentation

## 2016-12-04 DIAGNOSIS — T148XXA Other injury of unspecified body region, initial encounter: Secondary | ICD-10-CM | POA: Diagnosis not present

## 2016-12-04 DIAGNOSIS — Y939 Activity, unspecified: Secondary | ICD-10-CM | POA: Diagnosis not present

## 2016-12-04 DIAGNOSIS — Z955 Presence of coronary angioplasty implant and graft: Secondary | ICD-10-CM | POA: Diagnosis not present

## 2016-12-04 DIAGNOSIS — S3992XA Unspecified injury of lower back, initial encounter: Secondary | ICD-10-CM | POA: Diagnosis present

## 2016-12-04 DIAGNOSIS — Z85038 Personal history of other malignant neoplasm of large intestine: Secondary | ICD-10-CM | POA: Diagnosis not present

## 2016-12-04 DIAGNOSIS — I252 Old myocardial infarction: Secondary | ICD-10-CM | POA: Diagnosis not present

## 2016-12-04 DIAGNOSIS — Y929 Unspecified place or not applicable: Secondary | ICD-10-CM | POA: Diagnosis not present

## 2016-12-04 DIAGNOSIS — Z7982 Long term (current) use of aspirin: Secondary | ICD-10-CM | POA: Insufficient documentation

## 2016-12-04 DIAGNOSIS — Z87891 Personal history of nicotine dependence: Secondary | ICD-10-CM | POA: Insufficient documentation

## 2016-12-04 DIAGNOSIS — I509 Heart failure, unspecified: Secondary | ICD-10-CM | POA: Diagnosis not present

## 2016-12-04 DIAGNOSIS — M5489 Other dorsalgia: Secondary | ICD-10-CM | POA: Diagnosis not present

## 2016-12-04 DIAGNOSIS — Y999 Unspecified external cause status: Secondary | ICD-10-CM | POA: Diagnosis not present

## 2016-12-04 DIAGNOSIS — Z8673 Personal history of transient ischemic attack (TIA), and cerebral infarction without residual deficits: Secondary | ICD-10-CM | POA: Diagnosis not present

## 2016-12-04 DIAGNOSIS — S32020A Wedge compression fracture of second lumbar vertebra, initial encounter for closed fracture: Secondary | ICD-10-CM | POA: Diagnosis not present

## 2016-12-04 DIAGNOSIS — Z79899 Other long term (current) drug therapy: Secondary | ICD-10-CM | POA: Insufficient documentation

## 2016-12-04 LAB — PROTIME-INR
INR: 3.42
Prothrombin Time: 35.3 seconds — ABNORMAL HIGH (ref 11.4–15.2)

## 2016-12-04 MED ORDER — HYDROCODONE-ACETAMINOPHEN 5-325 MG PO TABS
1.0000 | ORAL_TABLET | Freq: Four times a day (QID) | ORAL | Status: DC | PRN
  Administered 2016-12-04: 2 via ORAL
  Administered 2016-12-04 – 2016-12-05 (×3): 1 via ORAL
  Filled 2016-12-04: qty 1
  Filled 2016-12-04: qty 2
  Filled 2016-12-04 (×2): qty 1

## 2016-12-04 MED ORDER — LOSARTAN POTASSIUM 50 MG PO TABS
100.0000 mg | ORAL_TABLET | Freq: Every day | ORAL | Status: DC
  Administered 2016-12-04 – 2016-12-05 (×2): 100 mg via ORAL
  Filled 2016-12-04 (×3): qty 2

## 2016-12-04 MED ORDER — DIVALPROEX SODIUM ER 500 MG PO TB24
500.0000 mg | ORAL_TABLET | Freq: Every day | ORAL | Status: DC
  Administered 2016-12-05: 500 mg via ORAL
  Filled 2016-12-04: qty 1

## 2016-12-04 MED ORDER — METOPROLOL TARTRATE 25 MG PO TABS
25.0000 mg | ORAL_TABLET | Freq: Two times a day (BID) | ORAL | Status: DC
  Administered 2016-12-04 – 2016-12-05 (×2): 25 mg via ORAL
  Filled 2016-12-04 (×2): qty 1

## 2016-12-04 MED ORDER — DONEPEZIL HCL 5 MG PO TABS
5.0000 mg | ORAL_TABLET | Freq: Every day | ORAL | Status: DC
  Administered 2016-12-04: 5 mg via ORAL
  Filled 2016-12-04: qty 1

## 2016-12-04 MED ORDER — DIAZEPAM 2 MG PO TABS
2.0000 mg | ORAL_TABLET | Freq: Four times a day (QID) | ORAL | Status: DC | PRN
  Administered 2016-12-04 – 2016-12-05 (×4): 2 mg via ORAL
  Filled 2016-12-04 (×4): qty 1

## 2016-12-04 MED ORDER — PANTOPRAZOLE SODIUM 40 MG PO TBEC
40.0000 mg | DELAYED_RELEASE_TABLET | Freq: Every day | ORAL | Status: DC
  Administered 2016-12-04 – 2016-12-05 (×2): 40 mg via ORAL
  Filled 2016-12-04 (×2): qty 1

## 2016-12-04 MED ORDER — ATORVASTATIN CALCIUM 10 MG PO TABS
10.0000 mg | ORAL_TABLET | Freq: Every day | ORAL | Status: DC
  Administered 2016-12-05: 10 mg via ORAL
  Filled 2016-12-04 (×2): qty 1

## 2016-12-04 MED ORDER — WARFARIN SODIUM 1 MG PO TABS: 1.5000 mg | ORAL_TABLET | ORAL | Status: DC

## 2016-12-04 MED ORDER — HYDROCHLOROTHIAZIDE 25 MG PO TABS
25.0000 mg | ORAL_TABLET | Freq: Every day | ORAL | Status: DC
  Administered 2016-12-04 – 2016-12-05 (×2): 25 mg via ORAL
  Filled 2016-12-04 (×3): qty 1

## 2016-12-04 MED ORDER — POLYVINYL ALCOHOL 1.4 % OP SOLN
1.0000 [drp] | Freq: Four times a day (QID) | OPHTHALMIC | Status: DC | PRN
  Administered 2016-12-05: 1 [drp] via OPHTHALMIC
  Filled 2016-12-04: qty 15

## 2016-12-04 MED ORDER — SENNOSIDES-DOCUSATE SODIUM 8.6-50 MG PO TABS
2.0000 | ORAL_TABLET | Freq: Every evening | ORAL | Status: DC | PRN
  Administered 2016-12-04: 2 via ORAL
  Filled 2016-12-04: qty 2

## 2016-12-04 MED ORDER — SERTRALINE HCL 50 MG PO TABS
100.0000 mg | ORAL_TABLET | Freq: Every day | ORAL | Status: DC
  Administered 2016-12-04 – 2016-12-05 (×2): 100 mg via ORAL
  Filled 2016-12-04 (×2): qty 2

## 2016-12-04 MED ORDER — ERYTHROMYCIN 5 MG/GM OP OINT: 1.0000 "application " | TOPICAL_OINTMENT | OPHTHALMIC | Status: DC | PRN

## 2016-12-04 MED ORDER — ADULT MULTIVITAMIN W/MINERALS CH
1.0000 | ORAL_TABLET | Freq: Every day | ORAL | Status: DC
  Administered 2016-12-04 – 2016-12-05 (×2): 1 via ORAL
  Filled 2016-12-04 (×2): qty 1

## 2016-12-04 MED ORDER — WARFARIN SODIUM 1 MG PO TABS
1.0000 mg | ORAL_TABLET | ORAL | Status: DC
  Administered 2016-12-05: 1 mg via ORAL
  Filled 2016-12-04: qty 1

## 2016-12-04 MED ORDER — ASPIRIN 81 MG PO CHEW
81.0000 mg | CHEWABLE_TABLET | Freq: Every day | ORAL | Status: DC
  Administered 2016-12-04 – 2016-12-05 (×2): 81 mg via ORAL
  Filled 2016-12-04 (×2): qty 1

## 2016-12-04 NOTE — Progress Notes (Signed)
CSW contacted Acute Rehab Department to inquire about patient's referral. Staff reported that page would be made for patient to be seen by PT.

## 2016-12-04 NOTE — Progress Notes (Signed)
CSW continuing to look for updated status in screening list for pt's PASSR on [place] MUST.  CSW sent referrals out via the hub to all SNF facilities in [place].  CSW also sent out referrals to SNF's in [place] due to pt's family's preference for [facility] facilities, one of which is [facility] located in [place].  Update family before placing because family prefers [facility] vs. [facility] and may choose a closer location to their home in [place], [place].  CSW awaiting PASSR number and will continue to follow.  Alphonse Guild. Mosie Angus, Latanya Presser, LCAS Clinical Social Worker Ph: [phone]

## 2016-12-04 NOTE — ED Notes (Signed)
[employer] TLSO brace complete

## 2016-12-04 NOTE — Progress Notes (Signed)
PHARMACIST - PHYSICIAN COMMUNICATION DR:  ED physician CONCERNING: Pharmacy Care Issues Regarding Warfarin Labs  RECOMMENDATION (Action Taken): A baseline and daily protime for three days has been ordered to meet the [facility] Patient safety goal and comply with the current [place].   The Pharmacy will defer all warfarin dose order changes and follow up of lab results to the prescriber unless an additional order to initiate a "pharmacy Coumadin consult" is placed.  DESCRIPTION:  While hospitalized, to be in compliance with [facility] Patient Safety Goals, all patients on warfarin must have a baseline and/or current protime prior to the administration of warfarin. Pharmacy has received your order for warfarin without these required laboratory assessments.  2/15 1830 Spoke with Dr Thomasene Lot about holding warfarin dose tonight for elevated INR=3.42.   Thanks Dorrene German 12/04/2016 6:58 PM

## 2016-12-04 NOTE — ED Notes (Signed)
[employer] called for TLSO brace 

## 2016-12-04 NOTE — NC FL2 (Signed)
[place] LEVEL OF CARE SCREENING TOOL     IDENTIFICATION  Patient Name: Samuel Santiago [id number] Birthdate: 22-Jun-1929 Sex: male Admission Date (Current Location): 12/04/2016  [facility] and [place] Number:  Herbalist and Address:  [facility],  [place] [address], [place]      Provider Number: [id number]  Attending Physician Name and Address:  Provider Default, MD  Relative Name and Phone Number:       Current Level of Care: Hospital Recommended Level of Care: Nursing Facility Prior Approval Number:    Date Approved/Denied:   PASRR Number:   [id number]    Discharge Plan: SNF    Current Diagnoses: Patient Active Problem List   Diagnosis Date Noted  . Compression fracture of L2 lumbar vertebra ([place]) 11/27/2016  . Spinal stenosis at L4-L5 level 11/27/2016  . Hypernatremia 10/09/2016  . Edema 08/27/2016  . Pulmonary nodules 05/09/2016  . Weakness 05/08/2016  . Dyspnea 05/08/2016  . Paranoid delusion ([place]) 03/26/2016  . Dementia 11/19/2015  . TIA (transient ischemic attack) 11/19/2015  . Leukocytosis   . HLD (hyperlipidemia)   . Receptive aphasia 11/18/2015  . Anticoagulation management encounter 10/16/2015  . Atrial fibrillation with rapid ventricular response ([place])   . New onset atrial fibrillation ([place]) 09/18/2015  . Atrial fibrillation ([place]) 09/18/2015  . Abrasion of arm, left 04/19/2015  . Cognitive deficits 04/19/2015  . Memory loss 03/31/2014  . Asthmatic bronchitis 03/17/2014  . Stress 07/26/2012  . Insomnia 03/24/2012  . History of nephrolithiasis 01/23/2012  . Macrocytosis 06/02/2011  . Ataxia 06/02/2011  . BLEPHARITIS 12/02/2010  . CYSTITIS 12/02/2010  . CBC, ABNORMAL 08/07/2009  . TOBACCO USE, QUIT 08/07/2009  . Dyslipidemia 12/06/2007  . Essential hypertension 12/06/2007  . Coronary atherosclerosis 12/06/2007  . Rosacea 12/06/2007  . Osteoarthritis 12/06/2007  . COLON CANCER, HX OF 12/06/2007  . COLONIC  POLYPS, HX OF 12/06/2007    Orientation RESPIRATION BLADDER Height & Weight     Self, Time, Situation, Place  Normal Continent Weight:   Height:  5\' 6"  (167.6 cm)  BEHAVIORAL SYMPTOMS/MOOD NEUROLOGICAL BOWEL NUTRITION STATUS      Continent Diet (Heart Healthy)  AMBULATORY STATUS COMMUNICATION OF NEEDS Skin   Extensive Assist Verbally Normal                       Personal Care Assistance Level of Assistance  Bathing, Feeding, Dressing Bathing Assistance: Maximum assistance Feeding assistance: Limited assistance Dressing Assistance: Maximum assistance     Functional Limitations Info             SPECIAL CARE FACTORS FREQUENCY  PT (By licensed PT), OT (By licensed OT)     PT Frequency: 5 OT Frequency: 5            Contractures      Additional Factors Info  Code Status, Allergies Code Status Info: Prior Allergies Info: Tape           Current Medications (12/04/2016):  This is the current hospital active medication list Current Facility-Administered Medications  Medication Dose Route Frequency Provider Last Rate Last Dose  . aspirin chewable tablet 81 mg  81 mg Oral Daily Isla Pence, MD   81 mg at 12/04/16 1615  . atorvastatin (LIPITOR) tablet 10 mg  10 mg Oral Daily Isla Pence, MD      . diazepam (VALIUM) tablet 2 mg  2 mg Oral Q6H PRN Isla Pence, MD   2 mg at  12/04/16 1616  . [START ON 12/05/2016] divalproex (DEPAKOTE ER) 24 hr tablet 500 mg  500 mg Oral Q breakfast Isla Pence, MD      . donepezil (ARICEPT) tablet 5 mg  5 mg Oral QHS Isla Pence, MD      . erythromycin ophthalmic ointment 1 application  1 application Both Eyes PRN Isla Pence, MD      . hydrochlorothiazide (HYDRODIURIL) tablet 25 mg  25 mg Oral Daily Isla Pence, MD   25 mg at 12/04/16 1645  . HYDROcodone-acetaminophen (NORCO/VICODIN) 5-325 MG per tablet 1-2 tablet  1-2 tablet Oral Q6H PRN Isla Pence, MD   2 tablet at 12/04/16 1616  . losartan (COZAAR) tablet 100  mg  100 mg Oral Daily Isla Pence, MD   100 mg at 12/04/16 1645  . metoprolol tartrate (LOPRESSOR) tablet 25 mg  25 mg Oral BID Isla Pence, MD   25 mg at 12/04/16 1615  . multivitamin with minerals tablet 1 tablet  1 tablet Oral Daily Isla Pence, MD   1 tablet at 12/04/16 1615  . pantoprazole (PROTONIX) EC tablet 40 mg  40 mg Oral Daily Isla Pence, MD   40 mg at 12/04/16 1615  . polyvinyl alcohol (LIQUIFILM TEARS) 1.4 % ophthalmic solution 1 drop  1 drop Both Eyes QID PRN Isla Pence, MD      . senna-docusate (Senokot-S) tablet 2 tablet  2 tablet Oral QHS PRN Isla Pence, MD   2 tablet at 12/04/16 1615  . [START ON 12/05/2016] sertraline (ZOLOFT) tablet 100 mg  100 mg Oral Q breakfast Isla Pence, MD   100 mg at 12/04/16 1615  . [START ON 12/05/2016] warfarin (COUMADIN) tablet 1 mg  1 mg Oral Once per day on Mon Wed Fri Sharlett Iles, MD      . Derrill Memo ON 12/06/2016] warfarin (COUMADIN) tablet 1.5 mg  1.5 mg Oral Once per day on Sun Tue Thu Sat Isla Pence, MD       Current Outpatient Prescriptions  Medication Sig Dispense Refill  . aspirin 81 MG chewable tablet Chew 1 tablet (81 mg total) by mouth daily.    Marland Kitchen atorvastatin (LIPITOR) 10 MG tablet Take 1 tablet (10 mg total) by mouth daily. 90 tablet 3  . cholecalciferol (VITAMIN D) 1000 units tablet Take 5,000 Units by mouth daily.    . divalproex (DEPAKOTE ER) 250 MG 24 hr tablet 1 qam for 1 week then 2  qam (Patient taking differently: Take 500 mg by mouth daily with breakfast. ) 60 tablet 5  . donepezil (ARICEPT) 5 MG tablet Take 1 tablet (5 mg total) by mouth at bedtime. 30 tablet 11  . GLUCOSAMINE-CHONDROITIN PO Take 1 tablet by mouth daily.    . hydrochlorothiazide (HYDRODIURIL) 25 MG tablet TAKE 1 TABLET BY MOUTH ONCE DAILY 90 tablet 3  . HYDROcodone-acetaminophen (NORCO/VICODIN) 5-325 MG tablet Take 1-2 tablets by mouth every 6 (six) hours as needed. (Patient taking differently: Take 1-2 tablets by mouth every  4 (four) hours as needed for moderate pain or severe pain. ) 60 tablet 0  . losartan (COZAAR) 100 MG tablet TAKE 1 TABLET BY MOUTH DAILY 90 tablet 3  . metoprolol tartrate (LOPRESSOR) 25 MG tablet TAKE ONE TABLET BY MOUTH TWICE DAILY 180 tablet 2  . Multiple Vitamin (MULTIVITAMIN WITH MINERALS) TABS tablet Take 1 tablet by mouth daily.    . Omega-3 Fatty Acids (FISH OIL PO) Take 1 capsule by mouth daily.    Marland Kitchen omeprazole (PRILOSEC)  20 MG capsule Take 20 mg by mouth daily.    . sertraline (ZOLOFT) 100 MG tablet Half a pill qam  For 6 days then 1  qam (Patient taking differently: Take 100 mg by mouth daily with breakfast. ) 30 tablet 5  . warfarin (COUMADIN) 1 MG tablet Take as directed by coumadin clinic (Patient taking differently: Take 1-1.5 mg by mouth as directed. Take 1.5 tablets (1.5 mg) on Sun, Tues, Thurs and Sat and Take 1 tablet (1 mg) on MWF.) 120 tablet 0  . acetaminophen (TYLENOL) 500 MG tablet Take 1,000 mg by mouth every 6 (six) hours as needed (pain).     . DOCOSAHEXAENOIC ACID PO Take 1 g by mouth daily.    Marland Kitchen erythromycin ophthalmic ointment Place 1 application into both eyes as needed (for infection of the eyes).     . polyvinyl alcohol-povidone (REFRESH) 1.4-0.6 % ophthalmic solution Place 1 drop into both eyes 4 (four) times daily as needed (dry eyes).     Marland Kitchen senna-docusate (SENOKOT S) 8.6-50 MG tablet Take 2 tablets by mouth at bedtime as needed for mild constipation. For constipation. Available over-the-counter. 30 tablet 3     Discharge Medications: Please see discharge summary for a list of discharge medications.  Relevant Imaging Results:  Relevant Lab Results:   Additional Information [id number] Pt is allergic to "TAPE"  Alphonse Guild [place], LCSWA

## 2016-12-04 NOTE — Telephone Encounter (Signed)
Routing to dr crawford-in dr plotnikov's absence--do you have any other suggestions or resources I could use for this patient?---please advise, I will call daughter back, thanks

## 2016-12-04 NOTE — Progress Notes (Signed)
CSW completed application for PASSR.  Barron Must indicated a nurse is reviewing screening form and for CSW to continue to look for updated status in screening list.  MUST ID is: [id number].   Alphonse Guild. Feather Berrie, Latanya Presser, LCAS Clinical Social Worker Ph: [phone]

## 2016-12-04 NOTE — Telephone Encounter (Signed)
Daughter called states patient could not stand, sit, feed himself or walk.  States took patient to ED.  ED is stating they are going to give patient a brace and sent home for compression fracture.  States that Education officer, museum there is going to send out PT evaluation.  States can't care for patient by their self.  States hospital is not going to send patient for rehab.  States patients legs are getting weaker b/c of laying around and he has also had incontinence.  Would like to know if Dr. Camila Li can do anything.

## 2016-12-04 NOTE — Progress Notes (Signed)
CSW spoke with patient at bedside, patient's wife and daughter present. CSW inquired about patient's recent fall. Patient reported that he was unable to speak with CSW about fall. CSW asked patient if CSW could speak with patient's wife and daughter, patient replied yes. Patient's wife reported that patient fell a week ago and came to the ED where he was discharged with pain medication for a compression fracture. Patient's wife reported that patient followed up with PCP and that the PCP informed them to bring patient back if he didn't get any better. Patient's daughter reported that patient's wife had a difficult time transporting patient to follow up appointment and getting patient back into home. Patient's daughter reports that patient uses a walker and can only stand a few seconds before feeling weak. Patient's daughter reports that patient is unable to complete ADLs and feeds himself with difficulty. Patient's daughter reports that patient's wife is no longer able to care for patient and that patient has been experiencing issues with continence. Patient's daughter reports that patient has been experiencing weakness for about a year and that it has gotten worst. Patient's daughter reported that she feels patient needs SNF for rehab to get better. CSW informed patient, patient's wife and daughter about placement process.    CSW attempted to contact Acute Rehab Department to inquire about patient's PT consult, no answer. CSW will continue to reach out to make contact.

## 2016-12-04 NOTE — Evaluation (Signed)
Physical Therapy Evaluation Patient Details Name: Samuel Santiago MRN: [id number] DOB: 08-20-1929 Today's Date: 12/04/2016   History of Present Illness  Pt presents to the ED 12/04/16  with increased back pain.  He was initially seen here on 2/6 after a fall and was diagnosed with an L2 compression fracture.    Clinical Impression  The patient required 2 max/total assist for mobilizing to sitting at the bed edge. He did stand and take small side steps  Using RW.  Assisted back into bed. Pt admitted with above diagnosis. Pt currently with functional limitations due to the deficits listed below (see PT Problem List).  Pt will benefit from skilled PT to increase their independence and safety with mobility to allow discharge to the venue listed below.       Follow Up Recommendations SNF;Supervision/Assistance - 24 hour    Equipment Recommendations  None recommended by PT    Recommendations for Other Services       Precautions / Restrictions Precautions Precautions: Fall Required Braces or Orthoses: Spinal Brace Spinal Brace: Thoracolumbosacral orthotic      Mobility  Bed Mobility Overal bed mobility: Needs Assistance Bed Mobility: Supine to Sit;Sit to Supine     Supine to sit: HOB elevated;+2 for physical assistance;+2 for safety/equipment;Total assist Sit to supine: Total assist;+2 for physical assistance;+2 for safety/equipment   General bed mobility comments: total assist with the legs to move to the bed edge. Assist with trunk to upright posture. . Total assist to return to supine.   Transfers Overall transfer level: Needs assistance Equipment used: Rolling walker (2 wheeled) Transfers: Sit to/from Stand Sit to Stand: Max assist;+2 physical assistance;+2 safety/equipment         General transfer comment: assist to rise and power up. the patient was able to assist with standing.   4 small side steps along the  bed  using RW with 2 mod assist.   Ambulation/Gait                Stairs            Wheelchair Mobility    Modified Rankin (Stroke Patients Only)       Balance Overall balance assessment: History of Falls;Needs assistance Sitting-balance support: Feet supported;Bilateral upper extremity supported Sitting balance-Leahy Scale: Poor     Standing balance support: During functional activity;Bilateral upper extremity supported Standing balance-Leahy Scale: Poor                               Pertinent Vitals/Pain Pain Assessment: Faces Faces Pain Scale: Hurts worst Pain Location: back Pain Descriptors / Indicators: Grimacing;Moaning;Spasm Pain Intervention(s): Monitored during session;Repositioned;Premedicated before session    Home Living Family/patient expects to be discharged to:: Private residence Living Arrangements: Spouse/significant other;Children Available Help at Discharge: Family;Available 24 hours/day Type of Home: House Home Access: Stairs to enter   [employer] of Steps: 4 Home Layout: One level Home Equipment: Walker - 2 wheels;Walker - 4 wheels;Tub bench Additional Comments: has lift chair    Prior Function Level of Independence: Needs assistance   Gait / Transfers Assistance Needed:per family at the bedside,  recently unable to ambulate, required 2 assist from lift chair.           Hand Dominance        Extremity/Trunk Assessment   Upper Extremity Assessment Upper Extremity Assessment: Generalized weakness    Lower Extremity Assessment Lower Extremity Assessment: Generalized weakness;RLE deficits/detail;LLE deficits/detail  RLE Deficits / Details: able to bear weight in standing LLE Deficits / Details: same as right    Cervical / Trunk Assessment Cervical / Trunk Assessment: Kyphotic (TLSO in place)  Communication      Cognition Arousal/Alertness: Awake/alert Behavior During Therapy: WFL for tasks assessed/performed Overall Cognitive Status: Within Functional  Limits for tasks assessed                      General Comments      Exercises     Assessment/Plan    PT Assessment Patient needs continued PT services  PT Problem List Decreased strength;Decreased activity tolerance;Decreased balance;Decreased mobility;Decreased knowledge of precautions;Decreased safety awareness;Decreased knowledge of use of DME;Pain          PT Treatment Interventions DME instruction;Gait training;Functional mobility training;Therapeutic activities;Patient/family education    PT Goals (Current goals can be found in the Care Plan section)  Acute Rehab PT Goals Patient Stated Goal: agreed to sit up, PT Goal Formulation: With patient/family Time For Goal Achievement: 12/18/16 Potential to Achieve Goals: Good    Frequency Min 3X/week   Barriers to discharge        Co-evaluation               End of Session   Activity Tolerance: No increased pain Patient left: in bed;with call bell/phone within reach;with family/visitor present Nurse Communication: Mobility status    Functional Assessment Tool Used: clinical judgement Functional Limitation: Mobility: Walking and moving around Mobility: Walking and Moving Around Current Status [id number]): 100 percent impaired, limited or restricted Mobility: Walking and Moving Around Goal Status [id number]): At least 20 percent but less than 40 percent impaired, limited or restricted    Time: 1645-1710 PT Time Calculation (min) (ACUTE ONLY): 25 min   Charges:   PT Evaluation $PT Eval Low Complexity: 1 Procedure PT Treatments $Therapeutic Activity: 8-22 mins   PT G Codes:   PT G-Codes **NOT FOR INPATIENT CLASS** Functional Assessment Tool Used: clinical judgement Functional Limitation: Mobility: Walking and moving around Mobility: Walking and Moving Around Current Status [id number]): 100 percent impaired, limited or restricted Mobility: Walking and Moving Around Goal Status [id number]): At least 20 percent but  less than 40 percent impaired, limited or restricted    Claretha Cooper 12/04/2016, 5:15 PM Tresa Endo PT [phone]

## 2016-12-04 NOTE — ED Notes (Signed)
Daughter's cell # [phone].

## 2016-12-04 NOTE — Progress Notes (Signed)
CSW viewed PT Consult results recommending SNF placement for the pt.  CSW updated EDP and met with family members who requested [facility] in [place].  CSW informed family [facility] is not in contract with [facility], and offered to send referrals to all SNF's in area and await bed availability information.  Family affirmed they would like this done.  Family stated they have preference for [facility] facilities [place] and [place], preferably [place].  CSW educated family they have the right to choose any available beds and not just facilites owned by [facility].  CSW provided family with CSW's phone number.  CSW then called all cell phone numbers of area SNF representatives in area who are in contract with pt's [facility] and left VM's requesting assistance with expediting placement for the pt on 2/15.  CSW will complete FL-2 for pt and send out to all SNF's in area and in [place], per pt's family request.  CSW informed family they will be updated by CSW on duty on 2/16 as to available bed offers.  CSW will continue to follow.  Alphonse Guild. Gonsalo Cuthbertson, Latanya Presser, LCAS Clinical Social Worker Ph: [phone]

## 2016-12-04 NOTE — ED Notes (Signed)
Bed: [id number] Expected date:  Expected time:  Means of arrival:  Comments: EMS/Fall 83yo

## 2016-12-04 NOTE — ED Notes (Signed)
Dr. Rex Kras informed of pt's b/p & pulse.  Received verbal order to hold metoprolol x 1 dose now.

## 2016-12-04 NOTE — ED Provider Notes (Signed)
[facility] DEPT Provider Note   CSN: [id number] Arrival date & time: 12/04/16  [id number]     History   Chief Complaint Chief Complaint  Patient presents with  . Back Pain    HPI Samuel Santiago [name] is a 83 y.o. male.  Pt presents to the ED today with increased back pain.  He was initially seen here on 2/6 and was diagnosed with an L2 compression fracture.  He was sent home with vicodin and told to f/u with his pcp.  The pt said his pain is getting worse.  He has been having to take 2 vicodins to help the pain.  The pt denies any new falls.      Past Medical History:  Diagnosis Date  . Arthritis   . CHF (congestive heart failure) ([place])    EF 50-55% 2016  . Chronic anticoagulation   . Colon cancer ([place]) 1990   Had recurrence in 2003 resultinbg in chemotherapy. He recently had a colonoscopy and  had a polyp removed that was benign.  . Coronary artery disease   . Dementia   . Dysrhythmia   . GERD (gastroesophageal reflux disease)   . History of colon polyps   . History of nephrolithiasis 01/23/2012  . Hyperlipidemia   . Hypertension   . Ischemic heart disease    has had remote MI in 1997 and was treated with TPA. Prior PCI to RCA with repeat PCI to the RCA in 2003  . Kidney stone    x11  . Myocardial infarction   . Normal nuclear stress test Feb 2012   No significant ischemia. EF 56%; with basal inferior scar  . PAF (paroxysmal atrial fibrillation) [facility])     Patient Active Problem List   Diagnosis Date Noted  . Compression fracture of L2 lumbar vertebra ([place]) 11/27/2016  . Spinal stenosis at L4-L5 level 11/27/2016  . Hypernatremia 10/09/2016  . Edema 08/27/2016  . Pulmonary nodules 05/09/2016  . Weakness 05/08/2016  . Dyspnea 05/08/2016  . Paranoid delusion ([place]) 03/26/2016  . Dementia 11/19/2015  . TIA (transient ischemic attack) 11/19/2015  . Leukocytosis   . HLD (hyperlipidemia)   . Receptive aphasia 11/18/2015  . Anticoagulation management encounter  10/16/2015  . Atrial fibrillation with rapid ventricular response ([place])   . New onset atrial fibrillation ([place]) 09/18/2015  . Atrial fibrillation ([place]) 09/18/2015  . Abrasion of arm, left 04/19/2015  . Cognitive deficits 04/19/2015  . Memory loss 03/31/2014  . Asthmatic bronchitis 03/17/2014  . Stress 07/26/2012  . Insomnia 03/24/2012  . History of nephrolithiasis 01/23/2012  . Macrocytosis 06/02/2011  . Ataxia 06/02/2011  . BLEPHARITIS 12/02/2010  . CYSTITIS 12/02/2010  . CBC, ABNORMAL 08/07/2009  . TOBACCO USE, QUIT 08/07/2009  . Dyslipidemia 12/06/2007  . Essential hypertension 12/06/2007  . Coronary atherosclerosis 12/06/2007  . Rosacea 12/06/2007  . Osteoarthritis 12/06/2007  . COLON CANCER, HX OF 12/06/2007  . COLONIC POLYPS, HX OF 12/06/2007    Past Surgical History:  Procedure Laterality Date  . COLON SURGERY     x 2 for cancer  . COLONOSCOPY     colon cancer  . [place] & 2003   PCI to RCA x 2  . EYE SURGERY         Home Medications    Prior to Admission medications   Medication Sig Start Date End Date Taking? Authorizing Provider  acetaminophen (TYLENOL) 500 MG tablet Take 1,000 mg by mouth every 6 (six) hours as needed (pain).  Historical Provider, MD  aspirin 81 MG chewable tablet Chew 1 tablet (81 mg total) by mouth daily. 11/20/15   Shanker Kristeen Mans, MD  atorvastatin (LIPITOR) 10 MG tablet Take 1 tablet (10 mg total) by mouth daily. 12/12/15   Burtis Junes, NP  Cholecalciferol (VITAMIN D-3 PO) Take 1 tablet by mouth daily.     Historical Provider, MD  divalproex (DEPAKOTE ER) 250 MG 24 hr tablet 1 qam for 1 week then 2  qam Patient taking differently: Take 500 mg by mouth daily with breakfast.  11/14/16   Norma Fredrickson, MD  DOCOSAHEXAENOIC ACID PO Take 1 g by mouth daily.    Historical Provider, MD  donepezil (ARICEPT) 5 MG tablet Take 1 tablet (5 mg total) by mouth at bedtime. 10/21/16   Aleksei Plotnikov V, MD  erythromycin  ophthalmic ointment Place 1 application into both eyes as needed (for infection of the eyes).  02/20/15   Historical Provider, MD  GLUCOSAMINE-CHONDROITIN PO Take 1 tablet by mouth daily.    Historical Provider, MD  hydrochlorothiazide (HYDRODIURIL) 25 MG tablet TAKE 1 TABLET BY MOUTH ONCE DAILY 11/17/16   Burtis Junes, NP  HYDROcodone-acetaminophen (NORCO/VICODIN) 5-325 MG tablet Take 1-2 tablets by mouth every 6 (six) hours as needed. 11/27/16   Aleksei Plotnikov V, MD  losartan (COZAAR) 100 MG tablet TAKE 1 TABLET BY MOUTH DAILY 10/31/16   Burtis Junes, NP  metoprolol tartrate (LOPRESSOR) 25 MG tablet TAKE ONE TABLET BY MOUTH TWICE DAILY 05/16/16   Larey Dresser, MD  Multiple Vitamin (MULTIVITAMIN WITH MINERALS) TABS tablet Take 1 tablet by mouth daily.    Historical Provider, MD  Omega-3 Fatty Acids (FISH OIL PO) Take 1 capsule by mouth daily.    Historical Provider, MD  omeprazole (PRILOSEC) 20 MG capsule Take 20 mg by mouth daily.    Historical Provider, MD  polyvinyl alcohol-povidone (REFRESH) 1.4-0.6 % ophthalmic solution Place 1 drop into both eyes 4 (four) times daily as needed (dry eyes).     Historical Provider, MD  senna-docusate (SENOKOT S) 8.6-50 MG tablet Take 2 tablets by mouth at bedtime as needed for mild constipation. For constipation. Available over-the-counter. 05/09/16   Ripudeep Krystal Eaton, MD  sertraline (ZOLOFT) 100 MG tablet Half a pill qam  For 6 days then 1  qam Patient taking differently: Take 100 mg by mouth daily with breakfast.  11/14/16   Norma Fredrickson, MD  warfarin (COUMADIN) 1 MG tablet Take as directed by coumadin clinic Patient taking differently: Take 1.5 mg by mouth every other day. Take as directed by coumadin clinic 09/29/16   Larey Dresser, MD    Family History Family History  Problem Relation Age of Onset  . Arthritis Sister   . Breast cancer Sister   . Congestive Heart Failure Mother   . Stroke Mother   . Heart disease Father   . Colon polyps Son     . Colon polyps Daughter   . Heart attack Brother     Social History Social History  Substance Use Topics  . Smoking status: Former Smoker    Quit date: 10/20/1985  . Smokeless tobacco: Never Used  . Alcohol use No     Allergies   Tape   Review of Systems Review of Systems  Musculoskeletal: Positive for back pain.  All other systems reviewed and are negative.    Physical Exam Updated Vital Signs BP 149/65 (BP Location: Left Arm)   Pulse (!) 53   Temp  97.8 F (36.6 C) (Oral)   Resp 16   Ht 5\' 6"  (1.676 m)   SpO2 100%   Physical Exam  Constitutional: He appears well-developed and well-nourished.  HENT:  Head: Normocephalic and atraumatic.  Right Ear: External ear normal.  Left Ear: External ear normal.  Nose: Nose normal.  Mouth/Throat: Oropharynx is clear and moist.  Eyes: EOM are normal. Pupils are equal, round, and reactive to light.  Bilateral eyelid inflammation (chronic)  Neck: Normal range of motion. Neck supple.  Cardiovascular: Normal heart sounds and intact distal pulses.  An irregularly irregular rhythm present. Bradycardia present.   Pulmonary/Chest: Effort normal and breath sounds normal.  Abdominal: Soft. Bowel sounds are normal.  Musculoskeletal:       Lumbar back: He exhibits tenderness.  Neurological: He is alert.  Skin: Skin is warm.  Psychiatric: He has a normal mood and affect. His behavior is normal. Judgment and thought content normal.  Nursing note and vitals reviewed.    ED Treatments / Results  Labs (all labs ordered are listed, but only abnormal results are displayed) Labs Reviewed - No data to display  EKG  EKG Interpretation None       Radiology Dg Lumbar Spine Complete  Result Date: 12/04/2016 CLINICAL DATA:  Worsening moderate to severe mid to low back pain over last few days, history compression fracture EXAM: LUMBAR SPINE - COMPLETE 4+ VIEW COMPARISON:  CT thoracolumbar spine 11/25/2016 ; correlation CTA chest  05/09/2016 FINDINGS: Marked osseous demineralization. Five non-rib-bearing lumbar vertebra. Again identified mild superior endplate compression fracture of L2 vertebral body with anterior height loss, minimally increased versus previous study. No additional fracture, subluxation or bone destruction. Atherosclerotic calcification aorta. Numerous surgical clips in abdomen. Gaseous distention of colon. Calcified gallstone 2 cm diameter in RIGHT upper quadrant, also noted on a prior CT. IMPRESSION: Marked osseous demineralization with slightly increased L2 compression fracture versus prior exam. Cholelithiasis. Electronically Signed   By: Lavonia Dana M.D.   On: 12/04/2016 10:41    Procedures Procedures (including critical care time)  Medications Ordered in ED Medications  aspirin chewable tablet 81 mg (not administered)  atorvastatin (LIPITOR) tablet 10 mg (not administered)  divalproex (DEPAKOTE ER) 24 hr tablet 500 mg (not administered)  donepezil (ARICEPT) tablet 5 mg (not administered)  erythromycin ophthalmic ointment 1 application (not administered)  hydrochlorothiazide (HYDRODIURIL) tablet 25 mg (not administered)  HYDROcodone-acetaminophen (NORCO/VICODIN) 5-325 MG per tablet 1-2 tablet (not administered)  losartan (COZAAR) tablet 100 mg (not administered)  metoprolol tartrate (LOPRESSOR) tablet 25 mg (not administered)  multivitamin with minerals tablet 1 tablet (not administered)  pantoprazole (PROTONIX) EC tablet 40 mg (not administered)  polyvinyl alcohol-povidone (HYPOTEARS) ophthalmic solution 1 drop (not administered)  senna-docusate (Senokot-S) tablet 2 tablet (not administered)  sertraline (ZOLOFT) tablet 100 mg (not administered)  warfarin (COUMADIN) tablet 1.5 mg (not administered)     Initial Impression / Assessment and Plan / ED Course  I have reviewed the triage vital signs and the nursing notes.  Pertinent labs & imaging results that were available during my care of the  patient were reviewed by me and considered in my medical decision making (see chart for details).    Pt d/w Dr. Lyla Glassing (ortho).  He recommended TLSO brace and pt f/u with Dr. Rolena Infante (spine guy).  Pt may need a kyphoplasty, but he is on coumadin, so that will have to be held if this procedure is wanted.  Family concerned about pt going home.  The pt had  ambulatory dysfunction prior to the fall, and now it is much worse.  His wife is unable to take care of pt at home.  The family request SNF placement.  SW and PT to be consulted.  Diet and home meds ordered.  Awaiting placement.  Final Clinical Impressions(s) / ED Diagnoses   Final diagnoses:  Closed compression fracture of second lumbar vertebra, initial encounter [facility])  Ambulatory dysfunction    New Prescriptions New Prescriptions   No medications on file     Isla Pence, MD 12/04/16 1529

## 2016-12-04 NOTE — Telephone Encounter (Signed)
Called and talked with Samuel Santiago-daughter---they are still at ED in transitional room waiting on evaluation from PT---social worker has already started process for FL-2 forms and skilled nursing placement---daughter will call back if she needs further assistance from our office

## 2016-12-04 NOTE — ED Triage Notes (Addendum)
Patient wife reports pt has increased pain since diagnosis of compression fracture. Patient states that pain is worse at some times so he takes 2 pain tablets. States that his back " is tight". Reports that he just took 2 Vicodin tablets. On blood thinners.

## 2016-12-04 NOTE — Telephone Encounter (Signed)
They can choose to go to skilled nursing facility and call around to see if there are openings and plotnikov can fill out an FL-2 once they pick a location. Medicare will cover.

## 2016-12-05 DIAGNOSIS — S329XXA Fracture of unspecified parts of lumbosacral spine and pelvis, initial encounter for closed fracture: Secondary | ICD-10-CM | POA: Diagnosis not present

## 2016-12-05 DIAGNOSIS — M810 Age-related osteoporosis without current pathological fracture: Secondary | ICD-10-CM | POA: Diagnosis not present

## 2016-12-05 DIAGNOSIS — I480 Paroxysmal atrial fibrillation: Secondary | ICD-10-CM | POA: Diagnosis not present

## 2016-12-05 DIAGNOSIS — I2510 Atherosclerotic heart disease of native coronary artery without angina pectoris: Secondary | ICD-10-CM | POA: Diagnosis not present

## 2016-12-05 DIAGNOSIS — R279 Unspecified lack of coordination: Secondary | ICD-10-CM | POA: Diagnosis not present

## 2016-12-05 DIAGNOSIS — E785 Hyperlipidemia, unspecified: Secondary | ICD-10-CM | POA: Diagnosis not present

## 2016-12-05 DIAGNOSIS — S32000S Wedge compression fracture of unspecified lumbar vertebra, sequela: Secondary | ICD-10-CM | POA: Diagnosis not present

## 2016-12-05 DIAGNOSIS — S32028D Other fracture of second lumbar vertebra, subsequent encounter for fracture with routine healing: Secondary | ICD-10-CM | POA: Diagnosis not present

## 2016-12-05 DIAGNOSIS — Z7901 Long term (current) use of anticoagulants: Secondary | ICD-10-CM | POA: Diagnosis not present

## 2016-12-05 DIAGNOSIS — M549 Dorsalgia, unspecified: Secondary | ICD-10-CM | POA: Diagnosis not present

## 2016-12-05 DIAGNOSIS — M48061 Spinal stenosis, lumbar region without neurogenic claudication: Secondary | ICD-10-CM | POA: Diagnosis not present

## 2016-12-05 DIAGNOSIS — R2681 Unsteadiness on feet: Secondary | ICD-10-CM | POA: Diagnosis not present

## 2016-12-05 DIAGNOSIS — S32028A Other fracture of second lumbar vertebra, initial encounter for closed fracture: Secondary | ICD-10-CM | POA: Diagnosis not present

## 2016-12-05 DIAGNOSIS — R41841 Cognitive communication deficit: Secondary | ICD-10-CM | POA: Diagnosis not present

## 2016-12-05 DIAGNOSIS — R278 Other lack of coordination: Secondary | ICD-10-CM | POA: Diagnosis not present

## 2016-12-05 DIAGNOSIS — M6281 Muscle weakness (generalized): Secondary | ICD-10-CM | POA: Diagnosis not present

## 2016-12-05 DIAGNOSIS — M545 Low back pain: Secondary | ICD-10-CM | POA: Diagnosis not present

## 2016-12-05 DIAGNOSIS — R1310 Dysphagia, unspecified: Secondary | ICD-10-CM | POA: Diagnosis not present

## 2016-12-05 DIAGNOSIS — G308 Other Alzheimer's disease: Secondary | ICD-10-CM | POA: Diagnosis not present

## 2016-12-05 DIAGNOSIS — I10 Essential (primary) hypertension: Secondary | ICD-10-CM | POA: Diagnosis not present

## 2016-12-05 LAB — PROTIME-INR
INR: 3.04
PROTHROMBIN TIME: 32.2 s — AB (ref 11.4–15.2)

## 2016-12-05 MED ORDER — ERYTHROMYCIN 5 MG/GM OP OINT
TOPICAL_OINTMENT | Freq: Two times a day (BID) | OPHTHALMIC | Status: DC
  Administered 2016-12-05: 07:00:00 via OPHTHALMIC
  Filled 2016-12-05: qty 3.5

## 2016-12-05 NOTE — Progress Notes (Signed)
Patient is able to DC to [facility] today 2/16. Family is willing to pay privately until insurance authorization is accepted/ denied. CSW faxed AVS to facility and is awaiting return call the room is ready for patient.   CSW will continue to update.   Kingsley Spittle, LCSWA Clinical Social Worker [phone]

## 2016-12-05 NOTE — Telephone Encounter (Signed)
Samuel Santiago (daughter) called back stating insurance is not going to pay for home health because pt went went home that day. She is not sure what else we can or she has to do to get insurance to pay for. Please call her back.   [phone]

## 2016-12-05 NOTE — ED Notes (Signed)
PTAR called by Junie Panning, Education officer, museum for patient to be transported to [place].  Report called to facility.

## 2016-12-05 NOTE — ED Notes (Signed)
[employer] [phone]

## 2016-12-05 NOTE — Progress Notes (Signed)
CSW assisting with SNF placement. SNF search initiated last night. No bed offers at this time but CSW expects to receive bed offers this am. CSW will contact family with bed offers, once available, and will begin [employer] authorization process.  Werner Lean LCSW [phone]

## 2016-12-05 NOTE — ED Notes (Signed)
[facility] will arrive by 230pm to adjust backbrace

## 2016-12-05 NOTE — ED Notes (Signed)
Attempted to call report to [place].  No one answered to give report to.  Will try; again later.

## 2016-12-05 NOTE — ED Provider Notes (Addendum)
Release Physical Exam  BP 151/76 (BP Location: Right Arm)   Pulse 60   Temp 97.7 F (36.5 C) (Oral)   Resp 18   Ht 5\' 6"  (1.676 m)   SpO2 96%   Physical Exam  ED Course  Procedures  MDM Seen by social work. Attempting placement but insurance will not  be able to place for at least 72 hours. will get PT evaluation to see if he can go home in the interim.    Davonna Belling, MD 12/05/16 1148  PT seen yesterday and recommended SNF with 24hr care    Davonna Belling, MD 12/05/16 1153  Patient accepted at [facility].    Davonna Belling, MD 12/05/16 1455

## 2016-12-05 NOTE — Progress Notes (Signed)
CSW has contacted multiple facilities regarding SNF/ accepting patient. Patient has [employer] and would require insurance authorization before being accepted. [facility], [facility], and [facility] all contract with [employer] however would need authorization before acceptance. CSW spoke with Rollene Fare, with [facility], who stated they do have beds available today and would be agreeable to accept patient if they could pay privately until insurance authorization. CSW met with family at bedside to discuss options and family is agreeable to private pay until insurance can be authorized. [facility] is currently reviewing patients information and will get back to CSW.   CSW will continue to update.   Kingsley Spittle, LCSWA Clinical Social Worker [phone]

## 2016-12-05 NOTE — Telephone Encounter (Signed)
Patient daughter called and said that the patient is going to go to a nursing home. She just wanted [place] to be aware.

## 2016-12-05 NOTE — ED Notes (Signed)
Report given to cynthia RN

## 2016-12-05 NOTE — Telephone Encounter (Signed)
Routing to dr plotnikov, fyi.... 

## 2016-12-05 NOTE — ED Notes (Signed)
Pt with green mucus to right outer eye.  Pt's eye cleaned with warm moist cloth.

## 2016-12-07 NOTE — Telephone Encounter (Signed)
Noted. Thx.

## 2016-12-09 DIAGNOSIS — M545 Low back pain: Secondary | ICD-10-CM | POA: Diagnosis not present

## 2016-12-10 DIAGNOSIS — M810 Age-related osteoporosis without current pathological fracture: Secondary | ICD-10-CM | POA: Diagnosis not present

## 2016-12-10 DIAGNOSIS — Z7901 Long term (current) use of anticoagulants: Secondary | ICD-10-CM | POA: Diagnosis not present

## 2016-12-10 DIAGNOSIS — I480 Paroxysmal atrial fibrillation: Secondary | ICD-10-CM | POA: Diagnosis not present

## 2016-12-10 DIAGNOSIS — G308 Other Alzheimer's disease: Secondary | ICD-10-CM | POA: Diagnosis not present

## 2016-12-10 DIAGNOSIS — S32028D Other fracture of second lumbar vertebra, subsequent encounter for fracture with routine healing: Secondary | ICD-10-CM | POA: Diagnosis not present

## 2016-12-10 DIAGNOSIS — I10 Essential (primary) hypertension: Secondary | ICD-10-CM | POA: Diagnosis not present

## 2016-12-10 DIAGNOSIS — I2510 Atherosclerotic heart disease of native coronary artery without angina pectoris: Secondary | ICD-10-CM | POA: Diagnosis not present

## 2016-12-10 NOTE — Telephone Encounter (Signed)
[facility] sent a form for surgical clearance for patient to have surgery TBD... Please advise if patient needs to be seen or not.Marland KitchenMarland Kitchen

## 2016-12-10 NOTE — Telephone Encounter (Signed)
What surgeries Thx

## 2016-12-11 NOTE — Telephone Encounter (Signed)
Ortho form sent to [facility] attn: Orson Slick [phone]

## 2016-12-22 NOTE — Telephone Encounter (Signed)
Patients daughter is calling again about her dad being cleared for surgery. Do you have any information on this?

## 2016-12-22 NOTE — Telephone Encounter (Signed)
Forms are in folder waiting on Dr. Alain Marion to fill out and sign

## 2016-12-24 NOTE — Telephone Encounter (Signed)
It looks like the paper had already been filled out and faxed back. But the paper work has been sent to scan so we are unable to send it again. We will need the form sent again.

## 2016-12-26 NOTE — Telephone Encounter (Signed)
The pt is clear foe vertebroplasty Thx

## 2016-12-26 NOTE — Telephone Encounter (Signed)
I called and spoke with [facility]. They informed me there is no paperwork that needs to be done at this time. That the office and Dr.Brooks has informed this to the family a few times and did so again to the son when he came in office yesterday. Dr. Rolena Infante give the nursing instructions on what to do and he wants to see how that goes first. Maybe later they will talk about surgery. Dr Rolena Infante will be back in office next week and they will follow up with family again about this. Office said if we have any more questions to please call them and follow up with Orson Slick [phone]

## 2016-12-29 ENCOUNTER — Ambulatory Visit: Payer: Self-pay | Admitting: Internal Medicine

## 2017-01-04 DIAGNOSIS — I480 Paroxysmal atrial fibrillation: Secondary | ICD-10-CM | POA: Diagnosis not present

## 2017-01-04 DIAGNOSIS — I10 Essential (primary) hypertension: Secondary | ICD-10-CM | POA: Diagnosis not present

## 2017-01-04 DIAGNOSIS — S32000S Wedge compression fracture of unspecified lumbar vertebra, sequela: Secondary | ICD-10-CM | POA: Diagnosis not present

## 2017-01-05 DIAGNOSIS — M545 Low back pain: Secondary | ICD-10-CM | POA: Diagnosis not present

## 2017-01-07 ENCOUNTER — Encounter: Payer: Self-pay | Admitting: Nurse Practitioner

## 2017-01-07 ENCOUNTER — Telehealth: Payer: Self-pay | Admitting: *Deleted

## 2017-01-07 NOTE — Progress Notes (Signed)
Received clearance paper for kyphoplasty.   Ok to proceed with kyphoplasty but will need to hold coumadin and most likely be bridged due to prior TIA/stroke.   I have alerted the pharmacist here who will be addressing the coumadin.   Burtis Junes, RN, [facility] [address] [place], [place]  [address] [phone]

## 2017-01-07 NOTE — Telephone Encounter (Signed)
Ok to proceed but will need to hold coumadin and bridge due to prior TIA/stroke.  Clearance paper given to pharmacist here in coumadin clinic.   Burtis Junes, RN, [facility] [address] [place], [place]  [address] [phone]

## 2017-01-07 NOTE — Telephone Encounter (Signed)
Agree with Lovenox bridging given afib with hx of TIA. Will coordinate in Coumadin clinic. Clearance faxed back to [facility] [phone]. Of note, pt has not had his INR checked with [place] since 11/10/16 and missed his follow up appt 6 weeks ago on 11/24/16. Will contact pt to schedule INR check and Lovenox bridge.

## 2017-01-07 NOTE — Telephone Encounter (Signed)
Called pt and no answer, voicemail is not set up to receive messages. Will try back later.

## 2017-01-07 NOTE — Telephone Encounter (Signed)
Spoke with pt's wife. Pt is being moved to assisted living tomorrow - [facility] on [address]. Will call nursing home to make sure that they will be managing pt's Coumadin like previous home was. Wife will also call clinic when pt has appt date set since nursing homes usually defer to [place] for Lovenox bridging.

## 2017-01-07 NOTE — Telephone Encounter (Signed)
Pt's wife called in to see if paperwork was sent to [place] on pt's surgical clearance.  Stated insurance stopped paying for skilled nursing, pt is not doing well and would like to move him to assisted living.  Waiting to hear about if pt can have surgery or not.  Pt would like a call back as soon as Samuel Santiago advises.  Will route to [place].

## 2017-01-09 ENCOUNTER — Telehealth: Payer: Self-pay | Admitting: *Deleted

## 2017-01-09 DIAGNOSIS — R69 Illness, unspecified: Secondary | ICD-10-CM | POA: Diagnosis not present

## 2017-01-09 NOTE — Telephone Encounter (Signed)
Spoke with Willette at [facility] who states that Caryl Asp is out of the office today and will return on Monday. She states that there is not another great contact to coordinate coumadin/lovenox for patient and would be best to leave message for Joy when she returns on Monday. LMTCB direct line for pharmacy clinic given.

## 2017-01-09 NOTE — Telephone Encounter (Signed)
Pt's wife per DPR stated received pt's surgery date on April 15 @ 7:30 am.  Needs office to call Joy the supervisor at [facility], Pt moved to assisted living @ [phone]. S/w with Eino Farber, Pharm-D and will be calling facility for instructions on coumadin.  Will route to [place].

## 2017-01-12 NOTE — Telephone Encounter (Signed)
Spoke with Samuel Poag, RN at [facility]. They have a doctor who will dose pt's Coumadin long-term. They can also manage Lovenox bridge if we fax over instructions. They would prefer once daily dosing. Pt weighs 163 lbs, can dose Lovenox 110mg  once daily using the 120mg  syringe strength.  Pt's procedure is on 4/5 at 7:30am. Pt will continue Coumadin as directed through Friday 3/30.  3/31: No Lovenox or Coumadin 4/1: Lovenox 110mg  SQ at 6pm, no Coumadin 4/2: Lovenox 110mg  SQ at 6pm, no Coumadin  4/3: Lovenox 110mg  SQ at 6pm, no Coumadin 4/4: No Lovenox or Coumadin 4/5: Procedure date. No Lovenox or Coumadin prior to procedure. If ok by MD, can resume Coumadin in the evening. 4/6: Resume Lovenox 110mg  SQ at 8am. Take usual Coumadin dose.  Continue with once daily Lovenox 110mg  SQ until INR is at least 2.    Faxed instructions to [facility] [phone] attn Joy

## 2017-01-13 ENCOUNTER — Other Ambulatory Visit: Payer: Self-pay | Admitting: Orthopedic Surgery

## 2017-01-13 ENCOUNTER — Ambulatory Visit: Payer: Self-pay | Admitting: Physician Assistant

## 2017-01-13 NOTE — Telephone Encounter (Signed)
Spoke with Joy at [facility]. She states that pt will not be returning to assisted living after procedure. He will be going to skilled nursing, phone [phone]. Spoke with Luanna Cole at skilled and she confirmed that they can manage lovenox bridge/coumadin for patient until discharged.

## 2017-01-14 ENCOUNTER — Encounter: Payer: Self-pay | Admitting: Internal Medicine

## 2017-01-14 NOTE — Pre-Procedure Instructions (Addendum)
    Samuel Santiago [facility]  01/14/2017      [facility] - [place] - [address] [address] [place] [address] Phone: [phone] Fax: [phone]    Your procedure is scheduled on thurs. April 5.  Report to [facility] Admitting at 5:30 A.M.  Call this number if you have problems the morning of surgery:  [phone]   Remember:  Do not eat food or drink liquids after midnight on Wed. April 4   Take these medicines the morning of surgery with A SIP OF WATER : tylenol if needed, depakote (divalproex), hydrocodone if needed, metoprolol(lopressor), omeprazole (prilosec),sertraline (zoloft), valium if needed, zofran if needed, eye drops if needed,           1 week prior to surgery stop advil, motrin, ibuprofen, aleve, BC Powders, Goody's, vitamins/herbal medicines, fish oil           Stop aspirin and coumadin per Dr. Rolena Infante           Lovenox instructions per Dr. Rolena Infante   Do not wear jewelry.  Do not wear lotions, powders, or perfumes, or deoderant.  Do not shave 48 hours prior to surgery.  Men may shave face and neck.  Do not bring valuables to the hospital.  [facility] is not responsible for any belongings or valuables.  Contacts, dentures or bridgework may not be worn into surgery.  Leave your suitcase in the car.  After surgery it may be brought to your room.  For patients admitted to the hospital, discharge time will be determined by your treatment team.       Please read over the following fact sheets that you were given. Coughing and Deep Breathing and MRSA Information

## 2017-01-15 ENCOUNTER — Encounter (HOSPITAL_COMMUNITY)
Admission: RE | Admit: 2017-01-15 | Discharge: 2017-01-15 | Disposition: A | Discharge status: Home / Self Care | Payer: Medicare HMO | Source: Ambulatory Visit | Attending: Orthopedic Surgery | Admitting: Orthopedic Surgery

## 2017-01-15 ENCOUNTER — Encounter (HOSPITAL_COMMUNITY): Payer: Self-pay

## 2017-01-15 DIAGNOSIS — M4856XA Collapsed vertebra, not elsewhere classified, lumbar region, initial encounter for fracture: Secondary | ICD-10-CM | POA: Diagnosis present

## 2017-01-15 DIAGNOSIS — Z7901 Long term (current) use of anticoagulants: Secondary | ICD-10-CM | POA: Diagnosis not present

## 2017-01-15 DIAGNOSIS — Z79899 Other long term (current) drug therapy: Secondary | ICD-10-CM | POA: Diagnosis not present

## 2017-01-15 DIAGNOSIS — Z7982 Long term (current) use of aspirin: Secondary | ICD-10-CM | POA: Insufficient documentation

## 2017-01-15 DIAGNOSIS — Z01818 Encounter for other preprocedural examination: Secondary | ICD-10-CM | POA: Diagnosis not present

## 2017-01-15 HISTORY — DX: Depression, unspecified: F32.A

## 2017-01-15 HISTORY — DX: Personal history of urinary calculi: Z87.442

## 2017-01-15 HISTORY — DX: Major depressive disorder, single episode, unspecified: F32.9

## 2017-01-15 LAB — CBC
HEMATOCRIT: 40.9 % (ref 39.0–52.0)
HEMOGLOBIN: 14.1 g/dL (ref 13.0–17.0)
MCH: 35.2 pg — ABNORMAL HIGH (ref 26.0–34.0)
MCHC: 34.5 g/dL (ref 30.0–36.0)
MCV: 102 fL — ABNORMAL HIGH (ref 78.0–100.0)
Platelets: 148 10*3/uL — ABNORMAL LOW (ref 150–400)
RBC: 4.01 MIL/uL — ABNORMAL LOW (ref 4.22–5.81)
RDW: 13.6 % (ref 11.5–15.5)
WBC: 11.5 10*3/uL — ABNORMAL HIGH (ref 4.0–10.5)

## 2017-01-15 LAB — BASIC METABOLIC PANEL
ANION GAP: 10 (ref 5–15)
BUN: 25 mg/dL — AB (ref 6–20)
CHLORIDE: 103 mmol/L (ref 101–111)
CO2: 25 mmol/L (ref 22–32)
Calcium: 9.8 mg/dL (ref 8.9–10.3)
Creatinine, Ser: 0.94 mg/dL (ref 0.61–1.24)
GFR calc Af Amer: >60 mL/min (ref >60)
GFR calc non Af Amer: >60 mL/min (ref >60)
GLUCOSE: 157 mg/dL — AB (ref 65–99)
POTASSIUM: 3.3 mmol/L — AB (ref 3.5–5.1)
Sodium: 138 mmol/L (ref 135–145)

## 2017-01-15 LAB — SURGICAL PCR SCREEN
MRSA, PCR: NEGATIVE
STAPHYLOCOCCUS AUREUS: NEGATIVE

## 2017-01-15 NOTE — Progress Notes (Addendum)
PCP: Dr. Alain Marion Cardiologist:  Delila Spence, PA  Pt. Lives at [facility]  Stop coumadin 01/16/17 and start lovenox 01/18/17.   Notified Dr. Rolena Infante' office that wife had not been instructed when to stop aspirin. Spoke with Einar Crow stated she sent papers over to [facility] to stop aspirin 5 days prior to surgery. She stated she would follow up with [facility].

## 2017-01-15 NOTE — Progress Notes (Signed)
Anesthesia PAT Evaluation: Patient is a 83 year old male scheduled for L2 kyphoplasty on 01/22/17 by Dr. Rolena Infante.  History includes former smoker (quit '87), CAD with MI '97 s/p thrombolysis and RCA stent and NSTEMI 04/06/02 s/p DES mid RCA, afib/PAF, CHF, TIA 10/2015 (INR subtherapeutic) HLD, colon cancer s/p partial colectomy '90 with recurrence '03 s/p resection and chemoradiation, HTN, GERD, dementia, depression.  PCP is Dr. Alain Marion. Per 12/04/16 telephone encounter, "The pt is clear foe vertebroplasty." He is also seeing a provider a [place].  Cardiologist is Dr. Marlou Porch, last visit with Truitt Merle, NP on 09/29/16. On 01/07/17, she wrote, "Ok to proceed with kyphoplasty but will need to hold coumadin and most likely be bridged due to prior TIA/stroke. I have alerted the pharmacist here who will be addressing the coumadin."   Meds include ASA 81 mg (to hold for 5 days prior to surgery), Lipitor, Depakote ER, Aricept, Lovenox, HCTZ, Norco, losartan, Lopressor, fish oil, Prilosec, Zofran, Zoloft, warfarin. Perioperative anticoagulation instructions as outline by [facility]: 3/31: No Lovenox or Coumadin 4/1: Lovenox 110mg  SQ at 6pm, no Coumadin 4/2: Lovenox 110mg  SQ at 6pm, no Coumadin  4/3: Lovenox 110mg  SQ at 6pm, no Coumadin 4/4: No Lovenox or Coumadin 4/5: Procedure date. No Lovenox or Coumadin prior to procedure. If ok by MD, can resume Coumadin in the evening. 4/6: Resume Lovenox 110mg  SQ at 8am. Take usual Coumadin dose. Continue with once daily Lovenox 110mg  SQ until INR is at least 2.  BP (!) 142/56   Pulse 65   Temp 37 C   Resp 20   Ht 5\' 4"  (1.626 m)   Wt 159 lb (72.1 kg)   SpO2 98%   BMI 27.29 kg/m   Patient is in a wheelchair. He answers some questions, but wife had to provide several details. He recently had to move out of the home into [facility] ([facility], now SNF), so he could have more care and help with mobility. He denied any acute symptoms. Feels he is  breathing okay. No increased work of breathing noted. Per wife, he is on regular food, but now meats are pureed due to him coughing. Exam shows heart rage RRR with occasional ectopic beat. No murmur noted. Lungs clear. He wears dentures.   EKG 05/08/16: SB at 57 bpm, abnormal R wave progression, early transition. Borderline repolarization abnormality.   Echo 09/19/15: Study Conclusions - Left ventricle: The cavity size was normal. Wall thickness was   normal. Systolic function was normal. The estimated ejection   fraction was in the range of 50% to 55%. Wall motion was normal;   there were no regional wall motion abnormalities. Doppler   parameters are consistent with abnormal left ventricular   relaxation (grade 1 diastolic dysfunction).  Nuclear stress test 12/16/10: Overall Impression: Basal inferior scar, cannot exclude coexistent soft tissue attenuation.  No significant ischemia. EF 60%.  Cardiac cath 04/07/02 (according to discharge summary): "This demonstrated single vessel obstructive coronary artery disease. There was a 95% stenosis in the mid right coronary artery. The previously stented segment in the proximal right coronary artery was still patent. There was 40% disease in the mid circumflex. The left ventricular function showed hypokinesia of the basal to mid inferior wall with overall ejection fraction of approximately 55%.The patient underwent successful stenting of the mid right coronary artery using a 3.0 x 18 mm Cipher drug eluding stent."  Carotid U/S 11/19/15: Summary: - The vertebral arteries appear patent with antegrade flow. - Findings consistent  with 1-39 percent stenosis involving the   right internal carotid artery and the left internal carotid   artery.  CXR 05/08/16: IMPRESSION: No edema or consolidation. Stable cardiac silhouette. Aortic atherosclerosis. Bones osteoporotic.  Chest CTA 05/09/16: IMPRESSION: 1. Negative for pulmonary embolism. 2. Bronchomalacia with  multi focal airway collapse. Bronchial wall thickening. 3. Four subpleural pulmonary nodules in the right upper lobe measuring up to 7 mm. If appropriate for comorbidities, recommend chest CT in 3-6 months. 4. Cholelithiasis. (Dr. Tana Coast discussed findings with patient's wife during this admission.)  Preoperative labs noted. Cr 0.94, glucose 157, WBC 11.5, H/H 14.1/40.9, PLT 148K. He will get PT/INR on the day of surgery.   Patient was given medical and cardiac clearances for surgery. He denied any acute issues, although he does have dementia and was not very communicative. Wife plans to come on the day of surgery. If no acute changes then I anticipate that he can proceed as planned.  George Hugh [facility] Short Stay Center/Anesthesiology Phone [phone] 01/15/2017 1:49 PM

## 2017-01-16 ENCOUNTER — Ambulatory Visit (HOSPITAL_COMMUNITY): Payer: Self-pay | Admitting: Psychiatry

## 2017-01-21 MED ORDER — CEFAZOLIN SODIUM-DEXTROSE 2-4 GM/100ML-% IV SOLN
2.0000 g | INTRAVENOUS | Status: AC
  Administered 2017-01-22: 2 g via INTRAVENOUS
  Filled 2017-01-21: qty 100

## 2017-01-22 ENCOUNTER — Observation Stay (HOSPITAL_COMMUNITY)
Admission: AD | Admit: 2017-01-22 | Discharge: 2017-01-25 | Disposition: A | Discharge status: Skilled Nursing Facility | Payer: Medicare HMO | Source: Ambulatory Visit | Attending: Orthopedic Surgery | Admitting: Orthopedic Surgery

## 2017-01-22 ENCOUNTER — Ambulatory Visit (HOSPITAL_COMMUNITY): Payer: Medicare HMO

## 2017-01-22 ENCOUNTER — Encounter (HOSPITAL_COMMUNITY)
Admission: AD | Disposition: A | Discharge status: Skilled Nursing Facility | Payer: Self-pay | Source: Ambulatory Visit | Attending: Orthopedic Surgery

## 2017-01-22 ENCOUNTER — Encounter (HOSPITAL_COMMUNITY): Payer: Self-pay | Admitting: Certified Registered Nurse Anesthetist

## 2017-01-22 ENCOUNTER — Ambulatory Visit (HOSPITAL_COMMUNITY): Payer: Medicare HMO | Admitting: Certified Registered Nurse Anesthetist

## 2017-01-22 ENCOUNTER — Ambulatory Visit (HOSPITAL_COMMUNITY): Payer: Medicare HMO | Admitting: Vascular Surgery

## 2017-01-22 DIAGNOSIS — K219 Gastro-esophageal reflux disease without esophagitis: Secondary | ICD-10-CM | POA: Diagnosis not present

## 2017-01-22 DIAGNOSIS — I10 Essential (primary) hypertension: Secondary | ICD-10-CM | POA: Insufficient documentation

## 2017-01-22 DIAGNOSIS — Z85038 Personal history of other malignant neoplasm of large intestine: Secondary | ICD-10-CM | POA: Insufficient documentation

## 2017-01-22 DIAGNOSIS — Z79899 Other long term (current) drug therapy: Secondary | ICD-10-CM | POA: Insufficient documentation

## 2017-01-22 DIAGNOSIS — I110 Hypertensive heart disease with heart failure: Secondary | ICD-10-CM | POA: Diagnosis not present

## 2017-01-22 DIAGNOSIS — Z86010 Personal history of colonic polyps: Secondary | ICD-10-CM | POA: Insufficient documentation

## 2017-01-22 DIAGNOSIS — Z87891 Personal history of nicotine dependence: Secondary | ICD-10-CM | POA: Insufficient documentation

## 2017-01-22 DIAGNOSIS — F419 Anxiety disorder, unspecified: Secondary | ICD-10-CM | POA: Insufficient documentation

## 2017-01-22 DIAGNOSIS — I252 Old myocardial infarction: Secondary | ICD-10-CM | POA: Insufficient documentation

## 2017-01-22 DIAGNOSIS — I2510 Atherosclerotic heart disease of native coronary artery without angina pectoris: Secondary | ICD-10-CM | POA: Insufficient documentation

## 2017-01-22 DIAGNOSIS — Z7901 Long term (current) use of anticoagulants: Secondary | ICD-10-CM | POA: Insufficient documentation

## 2017-01-22 DIAGNOSIS — M810 Age-related osteoporosis without current pathological fracture: Secondary | ICD-10-CM | POA: Diagnosis not present

## 2017-01-22 DIAGNOSIS — I509 Heart failure, unspecified: Secondary | ICD-10-CM | POA: Insufficient documentation

## 2017-01-22 DIAGNOSIS — Z87442 Personal history of urinary calculi: Secondary | ICD-10-CM | POA: Diagnosis not present

## 2017-01-22 DIAGNOSIS — M48061 Spinal stenosis, lumbar region without neurogenic claudication: Secondary | ICD-10-CM | POA: Diagnosis not present

## 2017-01-22 DIAGNOSIS — E785 Hyperlipidemia, unspecified: Secondary | ICD-10-CM | POA: Insufficient documentation

## 2017-01-22 DIAGNOSIS — Z419 Encounter for procedure for purposes other than remedying health state, unspecified: Secondary | ICD-10-CM

## 2017-01-22 DIAGNOSIS — F329 Major depressive disorder, single episode, unspecified: Secondary | ICD-10-CM | POA: Diagnosis not present

## 2017-01-22 DIAGNOSIS — M8008XA Age-related osteoporosis with current pathological fracture, vertebra(e), initial encounter for fracture: Secondary | ICD-10-CM | POA: Diagnosis not present

## 2017-01-22 DIAGNOSIS — Z9221 Personal history of antineoplastic chemotherapy: Secondary | ICD-10-CM | POA: Diagnosis not present

## 2017-01-22 DIAGNOSIS — F0390 Unspecified dementia without behavioral disturbance: Secondary | ICD-10-CM | POA: Diagnosis not present

## 2017-01-22 DIAGNOSIS — R531 Weakness: Secondary | ICD-10-CM | POA: Diagnosis not present

## 2017-01-22 DIAGNOSIS — S32020A Wedge compression fracture of second lumbar vertebra, initial encounter for closed fracture: Secondary | ICD-10-CM | POA: Diagnosis present

## 2017-01-22 DIAGNOSIS — I480 Paroxysmal atrial fibrillation: Secondary | ICD-10-CM | POA: Diagnosis not present

## 2017-01-22 DIAGNOSIS — M1990 Unspecified osteoarthritis, unspecified site: Secondary | ICD-10-CM | POA: Insufficient documentation

## 2017-01-22 DIAGNOSIS — M4856XA Collapsed vertebra, not elsewhere classified, lumbar region, initial encounter for fracture: Secondary | ICD-10-CM | POA: Diagnosis not present

## 2017-01-22 DIAGNOSIS — Z8673 Personal history of transient ischemic attack (TIA), and cerebral infarction without residual deficits: Secondary | ICD-10-CM | POA: Diagnosis not present

## 2017-01-22 HISTORY — PX: KYPHOPLASTY: SHX5884

## 2017-01-22 LAB — PROTIME-INR
INR: 1.01
PROTHROMBIN TIME: 13.3 s (ref 11.4–15.2)

## 2017-01-22 SURGERY — KYPHOPLASTY
Anesthesia: General | Site: Back

## 2017-01-22 MED ORDER — IOPAMIDOL (ISOVUE-300) INJECTION 61%
INTRAVENOUS | Status: AC
  Filled 2017-01-22: qty 50

## 2017-01-22 MED ORDER — ACETAMINOPHEN 10 MG/ML IV SOLN
INTRAVENOUS | Status: AC
  Filled 2017-01-22: qty 100

## 2017-01-22 MED ORDER — FENTANYL CITRATE (PF) 250 MCG/5ML IJ SOLN
INTRAMUSCULAR | Status: AC
  Filled 2017-01-22: qty 5

## 2017-01-22 MED ORDER — METOPROLOL TARTRATE 12.5 MG HALF TABLET
ORAL_TABLET | ORAL | Status: AC
  Filled 2017-01-22: qty 2

## 2017-01-22 MED ORDER — ONDANSETRON HCL 4 MG/2ML IJ SOLN
INTRAMUSCULAR | Status: DC | PRN
  Administered 2017-01-22: 4 mg via INTRAVENOUS

## 2017-01-22 MED ORDER — FENTANYL CITRATE (PF) 100 MCG/2ML IJ SOLN
INTRAMUSCULAR | Status: DC | PRN
Start: 2017-01-22 — End: 2017-01-22
  Administered 2017-01-22: 50 ug via INTRAVENOUS

## 2017-01-22 MED ORDER — SERTRALINE HCL 100 MG PO TABS
100.0000 mg | ORAL_TABLET | Freq: Every day | ORAL | Status: DC
  Administered 2017-01-22 – 2017-01-25 (×4): 100 mg via ORAL
  Filled 2017-01-22 (×4): qty 1

## 2017-01-22 MED ORDER — HYDROCODONE-ACETAMINOPHEN 5-325 MG PO TABS: 1.0000 | ORAL_TABLET | Freq: Four times a day (QID) | ORAL | 0 refills | Status: DC | PRN

## 2017-01-22 MED ORDER — METOPROLOL TARTRATE 25 MG PO TABS
25.0000 mg | ORAL_TABLET | Freq: Two times a day (BID) | ORAL | Status: DC
  Administered 2017-01-23 – 2017-01-25 (×5): 25 mg via ORAL
  Filled 2017-01-22 (×5): qty 1

## 2017-01-22 MED ORDER — DONEPEZIL HCL 5 MG PO TABS
5.0000 mg | ORAL_TABLET | Freq: Every day | ORAL | Status: DC
  Administered 2017-01-22 – 2017-01-24 (×3): 5 mg via ORAL
  Filled 2017-01-22 (×3): qty 1

## 2017-01-22 MED ORDER — LOSARTAN POTASSIUM 50 MG PO TABS
100.0000 mg | ORAL_TABLET | Freq: Every day | ORAL | Status: DC
Start: 2017-01-22 — End: 2017-01-25
  Administered 2017-01-23 – 2017-01-25 (×3): 100 mg via ORAL
  Filled 2017-01-22 (×3): qty 2

## 2017-01-22 MED ORDER — LACTATED RINGERS IV SOLN
INTRAVENOUS | Status: DC
  Administered 2017-01-22: 09:00:00 via INTRAVENOUS

## 2017-01-22 MED ORDER — SUGAMMADEX SODIUM 200 MG/2ML IV SOLN
INTRAVENOUS | Status: DC | PRN
  Administered 2017-01-22: 144.2 mg via INTRAVENOUS

## 2017-01-22 MED ORDER — ONDANSETRON HCL 4 MG/2ML IJ SOLN: 4.0000 mg | Freq: Once | INTRAMUSCULAR | Status: DC | PRN

## 2017-01-22 MED ORDER — EPINEPHRINE PF 1 MG/ML IJ SOLN
INTRAMUSCULAR | Status: AC
  Filled 2017-01-22: qty 1

## 2017-01-22 MED ORDER — MEPERIDINE HCL 25 MG/ML IJ SOLN: 12.5000 mg | INTRAMUSCULAR | Status: DC | PRN

## 2017-01-22 MED ORDER — FENTANYL CITRATE (PF) 100 MCG/2ML IJ SOLN: 25.0000 ug | INTRAMUSCULAR | Status: DC | PRN

## 2017-01-22 MED ORDER — 0.9 % SODIUM CHLORIDE (POUR BTL) OPTIME
TOPICAL | Status: DC | PRN
  Administered 2017-01-22: 1000 mL

## 2017-01-22 MED ORDER — DIVALPROEX SODIUM ER 500 MG PO TB24
500.0000 mg | ORAL_TABLET | Freq: Every day | ORAL | Status: DC
  Administered 2017-01-23 – 2017-01-25 (×3): 500 mg via ORAL
  Filled 2017-01-22 (×3): qty 1

## 2017-01-22 MED ORDER — PROPOFOL 10 MG/ML IV BOLUS
INTRAVENOUS | Status: AC
  Filled 2017-01-22: qty 20

## 2017-01-22 MED ORDER — PHENYLEPHRINE HCL 10 MG/ML IJ SOLN
INTRAMUSCULAR | Status: DC | PRN
  Administered 2017-01-22: 40 ug via INTRAVENOUS

## 2017-01-22 MED ORDER — ONDANSETRON HCL 4 MG PO TABS: 4.0000 mg | ORAL_TABLET | Freq: Three times a day (TID) | ORAL | 0 refills | Status: DC | PRN

## 2017-01-22 MED ORDER — EPHEDRINE SULFATE 50 MG/ML IJ SOLN
INTRAMUSCULAR | Status: DC | PRN
  Administered 2017-01-22: 10 mg via INTRAVENOUS
  Administered 2017-01-22 (×3): 5 mg via INTRAVENOUS

## 2017-01-22 MED ORDER — DIAZEPAM 5 MG PO TABS
5.0000 mg | ORAL_TABLET | Freq: Three times a day (TID) | ORAL | Status: DC | PRN
  Administered 2017-01-23 – 2017-01-24 (×2): 5 mg via ORAL
  Filled 2017-01-22 (×2): qty 1

## 2017-01-22 MED ORDER — ROCURONIUM BROMIDE 100 MG/10ML IV SOLN
INTRAVENOUS | Status: DC | PRN
  Administered 2017-01-22: 10 mg via INTRAVENOUS
  Administered 2017-01-22: 30 mg via INTRAVENOUS

## 2017-01-22 MED ORDER — ACETAMINOPHEN 10 MG/ML IV SOLN
INTRAVENOUS | Status: DC | PRN
  Administered 2017-01-22: 1000 mg via INTRAVENOUS

## 2017-01-22 MED ORDER — BUPIVACAINE-EPINEPHRINE 0.25% -1:200000 IJ SOLN
INTRAMUSCULAR | Status: DC | PRN
  Administered 2017-01-22: 30 mL

## 2017-01-22 MED ORDER — METOPROLOL TARTRATE 12.5 MG HALF TABLET: 25.0000 mg | ORAL_TABLET | Freq: Once | ORAL | Status: DC

## 2017-01-22 MED ORDER — TRAMADOL HCL 50 MG PO TABS
50.0000 mg | ORAL_TABLET | Freq: Four times a day (QID) | ORAL | Status: DC | PRN
  Administered 2017-01-22 – 2017-01-25 (×7): 50 mg via ORAL
  Filled 2017-01-22 (×8): qty 1

## 2017-01-22 MED ORDER — IOPAMIDOL (ISOVUE-300) INJECTION 61%
INTRAVENOUS | Status: DC | PRN
  Administered 2017-01-22: 30 mL

## 2017-01-22 MED ORDER — LACTATED RINGERS IV SOLN
INTRAVENOUS | Status: DC
  Administered 2017-01-22 – 2017-01-25 (×4): via INTRAVENOUS

## 2017-01-22 MED ORDER — BUPIVACAINE HCL (PF) 0.25 % IJ SOLN
INTRAMUSCULAR | Status: AC
  Filled 2017-01-22: qty 30

## 2017-01-22 MED ORDER — PROPOFOL 10 MG/ML IV BOLUS
INTRAVENOUS | Status: DC | PRN
  Administered 2017-01-22: 130 mg via INTRAVENOUS

## 2017-01-22 MED ORDER — HYDROCHLOROTHIAZIDE 25 MG PO TABS
25.0000 mg | ORAL_TABLET | Freq: Every day | ORAL | Status: DC
Start: 2017-01-22 — End: 2017-01-25
  Administered 2017-01-23 – 2017-01-25 (×3): 25 mg via ORAL
  Filled 2017-01-22 (×3): qty 1

## 2017-01-22 MED ORDER — BUPIVACAINE LIPOSOME 1.3 % IJ SUSP
20.0000 mL | INTRAMUSCULAR | Status: AC
  Administered 2017-01-22: 20 mL
  Filled 2017-01-22: qty 20

## 2017-01-22 MED ORDER — LIDOCAINE HCL (CARDIAC) 20 MG/ML IV SOLN
INTRAVENOUS | Status: DC | PRN
  Administered 2017-01-22: 80 mg via INTRAVENOUS

## 2017-01-22 SURGICAL SUPPLY — 46 items
BANDAGE ADH SHEER 1  50/CT (GAUZE/BANDAGES/DRESSINGS) ×2 IMPLANT
BLADE SURG 15 STRL LF DISP TIS (BLADE) ×1 IMPLANT
BLADE SURG 15 STRL SS (BLADE) ×2
BNDG ADH 5X3 H2O RPLNT NS (GAUZE/BANDAGES/DRESSINGS) ×1
BNDG COHESIVE 3X5 WHT NS (GAUZE/BANDAGES/DRESSINGS) ×1 IMPLANT
BONE FILLER DEVICE STRL SZ3 (INSTRUMENTS) ×1 IMPLANT
CEMENT BONE KYPHON CDS (Cement) ×1 IMPLANT
CEMENT KYPHON CX01A KIT/MIXER (Cement) ×1 IMPLANT
COVER MAYO STAND STRL (DRAPES) ×2 IMPLANT
CURETTE WEDGE 8.5MM KYPHX (MISCELLANEOUS) ×1 IMPLANT
DRAPE C-ARM 42X72 X-RAY (DRAPES) ×4 IMPLANT
DRAPE INCISE IOBAN 66X45 STRL (DRAPES) ×2 IMPLANT
DRAPE LAPAROTOMY T 102X78X121 (DRAPES) ×2 IMPLANT
DRAPE PROXIMA HALF (DRAPES) ×4 IMPLANT
DRSG AQUACEL AG ADV 3.5X10 (GAUZE/BANDAGES/DRESSINGS) ×2 IMPLANT
DURAPREP 26ML APPLICATOR (WOUND CARE) ×2 IMPLANT
ELECT PENCIL ROCKER SW 15FT (MISCELLANEOUS) ×2 IMPLANT
GAUZE SPONGE 4X4 16PLY XRAY LF (GAUZE/BANDAGES/DRESSINGS) ×2 IMPLANT
GLOVE BIO SURGEON STRL SZ 6.5 (GLOVE) ×2 IMPLANT
GLOVE BIOGEL PI IND STRL 6.5 (GLOVE) ×1 IMPLANT
GLOVE BIOGEL PI INDICATOR 6.5 (GLOVE) ×1
GLOVE SS BIOGEL STRL SZ 8.5 (GLOVE) ×2 IMPLANT
GLOVE SUPERSENSE BIOGEL SZ 8.5 (GLOVE) ×2
GOWN STRL REUS W/ TWL LRG LVL3 (GOWN DISPOSABLE) ×1 IMPLANT
GOWN STRL REUS W/TWL 2XL LVL3 (GOWN DISPOSABLE) ×4 IMPLANT
GOWN STRL REUS W/TWL LRG LVL3 (GOWN DISPOSABLE) ×2
KIT BASIN OR (CUSTOM PROCEDURE TRAY) ×2 IMPLANT
KIT ROOM TURNOVER OR (KITS) ×2 IMPLANT
NDL HYPO 25X1 1.5 SAFETY (NEEDLE) ×1 IMPLANT
NDL SPNL 18GX3.5 QUINCKE PK (NEEDLE) ×2 IMPLANT
NEEDLE HYPO 25X1 1.5 SAFETY (NEEDLE) ×2 IMPLANT
NEEDLE SPNL 18GX3.5 QUINCKE PK (NEEDLE) ×4 IMPLANT
NS IRRIG 1000ML POUR BTL (IV SOLUTION) ×2 IMPLANT
PACK SURGICAL SETUP 50X90 (CUSTOM PROCEDURE TRAY) ×2 IMPLANT
PACK UNIVERSAL I (CUSTOM PROCEDURE TRAY) ×2 IMPLANT
PAD ARMBOARD 7.5X6 YLW CONV (MISCELLANEOUS) ×4 IMPLANT
SURGIFLO W/THROMBIN 8M KIT (HEMOSTASIS) IMPLANT
SUT BONE WAX W31G (SUTURE) ×2 IMPLANT
SUT MON AB 3-0 SH 27 (SUTURE) ×2
SUT MON AB 3-0 SH27 (SUTURE) ×1 IMPLANT
SYR CONTROL 10ML LL (SYRINGE) ×3 IMPLANT
TOWEL OR 17X24 6PK STRL BLUE (TOWEL DISPOSABLE) ×2 IMPLANT
TOWEL OR 17X26 10 PK STRL BLUE (TOWEL DISPOSABLE) ×2 IMPLANT
TRAY KYPHOPAK 15/3 ONESTEP 1ST (MISCELLANEOUS) ×1 IMPLANT
TRAY KYPHOPAK 20/3 ONESTEP 1ST (MISCELLANEOUS) ×2 IMPLANT
WATER STERILE IRR 1000ML POUR (IV SOLUTION) ×2 IMPLANT

## 2017-01-22 NOTE — Op Note (Signed)
NAME:  Samuel Santiago, Samuel Santiago                   ACCOUNT NO.:  MEDICAL RECORD NO.:  [id number]  LOCATION:                                 FACILITY:  PHYSICIAN:  Aqua Denslow D. Rolena Infante, M.D. DATE OF BIRTH:  1929/01/02  DATE OF PROCEDURE:  01/22/2017 DATE OF DISCHARGE:                              OPERATIVE REPORT   PREOPERATIVE DIAGNOSIS:  L2 osteoporotic compression fracture with increasing severe pain.  POSTOPERATIVE DIAGNOSIS:  L2 osteoporotic compression fracture with increasing severe pain.  PROCEDURE PERFORMED:  L2 kyphoplasty.  COMPLICATIONS:  None.  FIRST ASSISTANT:  Carmen Mayo, my P.A.  HISTORY:  This is a very pleasant 83 year old gentleman, who has had severe increasing pain, deconditioning.  The patient has underlying spinal stenosis, but has had an acute increase in mid to upper lumbar pain.  He is diagnosed with an L2 osteoporotic compression fracture. Attempting a bracing and conservative management failed.  As a result, we elected to proceed with surgery.  All appropriate risks, benefits, and alternatives were discussed with the patient and consent was obtained.  OPERATIVE NOTE:  The patient was brought to the operating room and placed supine on the operating table.  After induction of general anesthesia and intubation, TEDs, SCDs were applied.  He was turned prone onto the [place] frame.  All bony prominences were well padded and the back was prepped and draped in a standard fashion.  Time-out was taken confirming patient, procedure, and all other pertinent important data. Once this was done, 2 fluoro machines were brought into the surgical field, one in the AP the other lateral for biplanar fluoro.  Identified the appropriate surface landmarks over the lateral side of the pedicle. Infiltrated the area with a mixture of 0.25% Marcaine with Exparel.  I made a small incision.  I advanced the Jamshidi needle down to the lateral border of the pedicle.  Using biplanar fluoro,  advanced through the pedicle and into the vertebral body.  I repeated this on the contralateral side.  I then drilled and used a curette to create my defect.  I then inserted the inflatable bone tamps.  I inflated them with each of about 3 mL of fluid and then deflated.  I then inserted the cement.  I made sure I had excellent fill from endplate to endplate and to the level of the pedicle.  There was no leak of any cement.  Once the cement hardened, final x-rays were taken which were satisfactory.  The trocars were removed and the remaining portion of Marcaine with Exparel was inserted for postoperative analgesia.  The small incisions were closed with simple 3-0 Monocryl and a Band-Aid was applied.  The patient was then extubated, transferred to PACU without incident.  At the end of the case, all needle and sponge counts were correct.  There were no adverse intraoperative events.     Zaylei Mullane D. Rolena Infante, M.D.     DDB/MEDQ  D:  01/22/2017  T:  01/22/2017  Job:  [id number]

## 2017-01-22 NOTE — H&P (Signed)
History of Present Illness  The patient is a 83 year old male who presents for a Follow-up for Back pain. The patient is here today to recheck the back pain and discuss surgery. The patient reports low back symptoms which began 6 week(s) ago following a specific injury (following a fall on 11/23/16). and Symptoms include pain (with movement), decreased range of motion and pain with coughing/sneezing, while symptoms do not include incontinence of stool or incontinence of urine. The patient describes the severity of their symptoms as moderate to severe. The patient feels as if the symptoms are unchanging. Current treatment includes opioid analgesics (Hydrocodone 5-325 mg, TLSO brace). Pertinent medical history includes depression and malignancy (Colon x 2). Past evaluation has included CT of the lumbar spine (and the Thoracic spine). Note for "Back pain": The family wants to discuss surgery regarding a kyphoplasty.   Subjective Transcription Talyn returns today for followup. Last week he communicated with his wife and daughter that he has been having progressive debilitating pain. He did state that over the weekend, he was able to stand and take some steps and ambulate. He is currently in a wheelchair because of severe pain. He is grossly neurologically intact, but just significantly deconditioned. He is moving all four extremities. He has got globally a 4/5 strength, but again it is also secondary to pain and deconditioning. He has a TLSO brace on. No loss of bowel and bladder control. Compartments are soft and nontender.  At this point in time, I did see the clearance form for Dr. Alain Marion his primary care physician who cleared him. However, he is still on Coumadin per his cardiologist. We will need to make sure that from a cardiac standpoint, he is cleared for general anesthesia and he could be off the Coumadin for at least five days before moving forward with surgery. His wife and son are with him today.  They will take the form over to his cardiologist so that we can make sure that he is cleared. Once, I have that information then we can move forward with a surgical date. The plan is to do a kyphoplasty. They have had an opportunity to review the educational video. I have gone over the procedure. All their questions were addressed. Risks include infection, bleeding, nerve damage, death, stroke, paralysis, ongoing or worse pain, leak of cement. All of their questions were addressed.   Problem List/Past Medical  Acute midline low back pain without sciatica (M54.5)  Problems Reconciled   Allergies (Robin C. Young; 01/05/2017 10:34 AM) No Known Drug Allergies [12/09/2016]: Allergies Reconciled   Social History  Children  3 Current work status  retired Furniture conservator/restorer rarely Living situation  live with spouse Marital status  married No history of drug/alcohol rehab  Under pain contract   Medication History  Hydrocodone-Acetaminophen (5-325MG  Tablet, Oral) Active. (prn Rx'd by ED) Donepezil HCl (5MG  Tablet, Oral) Active. (prn) HydroCHLOROthiazide (25MG  Tablet, Oral) Active. (qd) Losartan Potassium (100MG  Tablet, Oral) Active. (qd) Metoprolol Tartrate (25MG  Tablet, Oral) Active. (bid) Sertraline HCl (100MG  Tablet, Oral) Active. (qd) Warfarin Sodium (1MG  Tablet, Oral) Active. ("as directed by the coumadin clinic") Medications Reconciled  Other Problems  Anxiety Disorder  Cerebrovascular Accident  Chronic Pain  Colon Cancer  Depression  Gastroesophageal Reflux Disease  High blood pressure  Kidney Stone  Myocardial infarction  Psychiatric disorder   Objective Transcription On clinical exam, he is a pleasant gentleman who is in obvious distress. He is in a wheelchair, unable to stand, complaining of  severe lower lumbar pain.  He can move his lower extremities, but he cannot stand. He has a TLSO brace. There is no obvious abrasions or contusion to  lumbar spine. He has what appears to be somewhat altered mental status with some difficulty focusing and concentrating.  This is a very limited physical exam due to his severe pain.  His CT scan from 11/25/2016 shows spinal stenosis at L4-5 along with a slight osteoporotic compression fracture of L2.  Assessments Transcription At this point in time, I do think that the fracture is most likely the source of his current pain level. I have discussed kyphoplasty with the family. I did tell him that the stenosis could be contributing to his pain, but it is more unlikely that given the fall that he had, the fracture that this is the source of his problem. We reviewed the risks of a kyphoplasty, which include infection, bleeding, nerve damage, death, stroke, paralysis, bleeding, blood clots, leak of cement, ongoing or worse pain, need for additional surgery. The patient has multiple medical and cardiac issues and so if we were to consider surgery he would need to be cleared by his PCP. He would also need to be off of his blood thinner (Coumadin) for at least five days and then it can be restarted 48 hours after surgery.

## 2017-01-22 NOTE — Transfer of Care (Signed)
Immediate Anesthesia Transfer of Care Note  Patient: Morse Brueggemann [id number]  Procedure(s) Performed: Procedure(s): KYPHOPLASTY L2 (N/A)  Patient Location: PACU  Anesthesia Type:General  Level of Consciousness: awake, alert , oriented, patient cooperative and responds to stimulation  Airway & Oxygen Therapy: Patient Spontanous Breathing and Patient connected to face mask oxygen  Post-op Assessment: Report given to RN, Post -op Vital signs reviewed and stable and Patient moving all extremities X 4  Post vital signs: Reviewed and stable  Last Vitals:  Vitals:   01/22/17 0823 01/22/17 0901  BP: (!) 187/82 (!) 159/71  Pulse: (!) 57 (!) 59  Resp: (!) 22   Temp: 36.4 C     Last Pain:  Vitals:   01/22/17 0823  TempSrc: Oral      Patients Stated Pain Goal: 3 (01/60/10 9323)  Complications: No apparent anesthesia complications

## 2017-01-22 NOTE — Anesthesia Procedure Notes (Signed)
Procedure Name: Intubation Date/Time: 01/22/2017 9:56 AM Performed by: Tressia Miners LEFFEW Pre-anesthesia Checklist: Patient identified, Patient being monitored, Timeout performed, Emergency Drugs available and Suction available Patient Re-evaluated:Patient Re-evaluated prior to inductionOxygen Delivery Method: Circle System Utilized Preoxygenation: Pre-oxygenation with 100% oxygen Intubation Type: IV induction Ventilation: Oral airway inserted - appropriate to patient size and Two handed mask ventilation required Laryngoscope Size: Mac and 4 Grade View: Grade II Tube type: Oral Tube size: 7.5 mm Number of attempts: 1 Airway Equipment and Method: Stylet Placement Confirmation: ETT inserted through vocal cords under direct vision,  positive ETCO2 and breath sounds checked- equal and bilateral Secured at: 23 cm Tube secured with: Tape Dental Injury: Teeth and Oropharynx as per pre-operative assessment

## 2017-01-22 NOTE — Anesthesia Preprocedure Evaluation (Addendum)
Anesthesia Evaluation   Patient awake    Reviewed: Allergy & Precautions, NPO status , Patient's Chart, lab work & pertinent test results, reviewed documented beta blocker date and time   Airway Mallampati: II  TM Distance: >3 FB Neck ROM: Full    Dental  (+) Upper Dentures, Lower Dentures   Pulmonary shortness of breath, asthma , former smoker,    breath sounds clear to auscultation       Cardiovascular hypertension, + CAD, + Past MI and +CHF  + dysrhythmias  Rhythm:Regular Rate:Normal     Neuro/Psych TIA   GI/Hepatic GERD (occasional)  Medicated and Controlled,  Endo/Other    Renal/GU Renal disease     Musculoskeletal  (+) Arthritis ,   Abdominal   Peds  Hematology   Anesthesia Other Findings   Reproductive/Obstetrics                            Anesthesia Physical Anesthesia Plan  ASA: III  Anesthesia Plan: General   Post-op Pain Management:    Induction: Intravenous  Airway Management Planned: Oral ETT  Additional Equipment:   Intra-op Plan:   Post-operative Plan: Extubation in OR  Informed Consent: I have reviewed the patients History and Physical, chart, labs and discussed the procedure including the risks, benefits and alternatives for the proposed anesthesia with the patient or authorized representative who has indicated his/her understanding and acceptance.   Consent reviewed with POA  Plan Discussed with: CRNA and Surgeon  Anesthesia Plan Comments: (Carotid study:- The vertebral arteries appear patent with antegrade flow. - Findings consistent with 1-39 percent stenosis involving the   right internal carotid artery and the left internal carotid   artery. Echo(2016):Left ventricle: The cavity size was normal. Wall thickness was   normal. Systolic function was normal. The estimated ejection   fraction was in the range of 50% to 55%. Wall motion was normal;   there  were no regional wall motion abnormalities. Doppler   parameters are consistent with abnormal left ventricular   relaxation (grade 1 diastolic dysfunction). Cardiology - ok to proceed per note 01/10/17 Metoprolol not given secondary to bradycardia today.patient was not given other meds as directed. Redness around both eyes, known per family after ectropion surgery.)      Anesthesia Quick Evaluation

## 2017-01-22 NOTE — Anesthesia Postprocedure Evaluation (Signed)
Anesthesia Post Note  Patient: Samuel Santiago [id number]  Procedure(s) Performed: Procedure(s) (LRB): KYPHOPLASTY L2 (N/A)  Patient location during evaluation: PACU Anesthesia Type: General Level of consciousness: awake Pain management: pain level controlled Vital Signs Assessment: post-procedure vital signs reviewed and stable Respiratory status: spontaneous breathing Cardiovascular status: stable Postop Assessment: no signs of nausea or vomiting Anesthetic complications: no       Last Vitals:  Vitals:   01/22/17 0901 01/22/17 1118  BP: (!) 159/71 (!) 150/66  Pulse: (!) 59 70  Resp:  15  Temp:  36.3 C    Last Pain:  Vitals:   01/22/17 0823  TempSrc: Oral                 Jolly Mango

## 2017-01-22 NOTE — Brief Op Note (Signed)
01/22/2017  10:47 AM  PATIENT:  Samuel Santiago  83 y.o. male  PRE-OPERATIVE DIAGNOSIS:  L2 Osteoporstic compression  POST-OPERATIVE DIAGNOSIS:  L2 Osteoporstic compression  PROCEDURE:  Procedure(s): KYPHOPLASTY L2 (N/A)  SURGEON:  Surgeon(s) and Role:    * Melina Schools, MD - Primary  PHYSICIAN ASSISTANT:   ASSISTANTS: Carmen Mayo   ANESTHESIA:   general  EBL:  Total I/O In: 500 [I.V.:500] Out: 15 [Blood:15]  BLOOD ADMINISTERED:none  DRAINS: none   LOCAL MEDICATIONS USED:  MARCAINE    and OTHER Exparail  SPECIMEN:  No Specimen  DISPOSITION OF SPECIMEN:  N/A  COUNTS:  YES  TOURNIQUET:  * No tourniquets in log *  DICTATION: .Other Dictation: Dictation Number [id number]  PLAN OF CARE: Admit to inpatient   PATIENT DISPOSITION:  PACU - hemodynamically stable.

## 2017-01-23 ENCOUNTER — Encounter (HOSPITAL_COMMUNITY): Payer: Self-pay | Admitting: Orthopedic Surgery

## 2017-01-23 DIAGNOSIS — M8008XA Age-related osteoporosis with current pathological fracture, vertebra(e), initial encounter for fracture: Secondary | ICD-10-CM | POA: Diagnosis not present

## 2017-01-23 NOTE — Progress Notes (Signed)
    Subjective: 1 Day Post-Op Procedure(s) (LRB): KYPHOPLASTY L2 (N/A) Patient reports pain as 0 on 0-10 scale.   Denies CP or SOB.  Voiding without difficulty. Positive flatus. Objective: Vital signs in last 24 hours: Temp:  [97.3 F (36.3 C)-98.9 F (37.2 C)] 98.9 F (37.2 C) (04/06 0606) Pulse Rate:  [56-74] 73 (04/06 0606) Resp:  [15-22] 18 (04/06 0606) BP: (112-187)/(58-82) 138/66 (04/06 0606) SpO2:  [94 %-100 %] 95 % (04/06 0606) Weight:  [72.1 kg (159 lb)] 72.1 kg (159 lb) (04/05 0852)  Intake/Output from previous day: 04/05 0701 - 04/06 0700 In: 1687.2 [I.V.:1687.2] Out: 265 [Urine:250; Blood:15] Intake/Output this shift: No intake/output data recorded.  Labs: No results for input(s): HGB in the last 72 hours. No results for input(s): WBC, RBC, HCT, PLT in the last 72 hours. No results for input(s): NA, K, CL, CO2, BUN, CREATININE, GLUCOSE, CALCIUM in the last 72 hours.  Recent Labs  01/22/17 0851  INR 1.01    Physical Exam: Neurologically intact ABD soft Sensation intact distally Dorsiflexion/Plantar flexion intact Incision: no drainage Compartment soft  Assessment/Plan: 1 Day Post-Op Procedure(s) (LRB): KYPHOPLASTY L2 (N/A) Advance diet Up with therapy  Pt is waiting for SW consult for SNF placement Medications on chart Pt needs to present to [facility] for first post op appt in 2 weeks  Mayo, Darla Lesches for Dr. Melina Schools [facility] [phone] 01/23/2017, 7:03 AM    Patient ID: Samuel Santiago, male   DOB: Nov 15, 1928, 83 y.o.   MRN: [id number]

## 2017-01-23 NOTE — Evaluation (Signed)
Physical Therapy Evaluation Patient Details Name: Samuel Santiago MRN: [id number] DOB: 06-09-1929 Today's Date: 01/23/2017   History of Present Illness  Pt is a 83 y.o. S/P L2 kyphoplasty.  PMH includes osteoporosis, back pain,  A fib, MI, HTN, GERD, dementia, CHF, CA, arthritis.  Clinical Impression  Pt evaluation limited by pain and baseline dementia which might contribute to decreased carryover of precautions and mobility instruction.  He required max +2 physical assist for bed mobility and transfer to chair. PTA, pt was at home independent with wife, but he will benefit from rehab services at a SNF to regain independence with functional mobility before returning home (see PT Problem List). Will follow acutely.    Follow Up Recommendations SNF;Supervision/Assistance - 24 hour    Equipment Recommendations       Recommendations for Other Services       Precautions / Restrictions Restrictions Weight Bearing Restrictions: No Other Position/Activity Restrictions: Back precautions for comfort      Mobility  Bed Mobility Overal bed mobility: Needs Assistance Bed Mobility: Rolling;Sidelying to Sit Rolling: Mod assist;+2 for physical assistance Sidelying to sit: Max assist;+2 for physical assistance       General bed mobility comments: VC's and +2 physical assist needed.  Sidelying to sit was very painful, causing pt to yell and groan.  Transfers Overall transfer level: Needs assistance Equipment used: Rolling walker (2 wheeled) Transfers: Sit to/from [employer];Lateral/Scoot Transfers Sit to Stand: Max assist;+2 physical assistance   Squat pivot transfers: Max assist;+2 physical assistance    Lateral/Scoot Transfers: Mod assist;+2 physical assistance General transfer comment: Very painful  Ambulation/Gait                Stairs            Wheelchair Mobility    Modified Rankin (Stroke Patients Only)       Balance Overall balance  assessment: Needs assistance Sitting-balance support: Bilateral upper extremity supported Sitting balance-Leahy Scale: Fair Sitting balance - Comments: strong posterior lead with sitting at first Postural control: Posterior lean Standing balance support: Bilateral upper extremity supported Standing balance-Leahy Scale: Zero Standing balance comment: Pt completed 4 standing trials in order to clean stool.  +2 max assist needed.                             Pertinent Vitals/Pain Pain Assessment: Faces (Pain started with movement) Faces Pain Scale: Hurts even more Pain Location: surgical site Pain Descriptors / Indicators: Operative site guarding Pain Intervention(s): Limited activity within patient's tolerance;Monitored during session;Repositioned    Home Living Family/patient expects to be discharged to:: Private residence Living Arrangements: Spouse/significant other Available Help at Discharge: Family;Available PRN/intermittently Type of Home: House Home Access: Stairs to enter Entrance Stairs-Rails: Right;Left;Can reach both Entrance Stairs-Number of Steps: 4 Home Layout: One level Home Equipment: Walker - 2 wheels;Walker - 4 wheels;Tub bench;Hand held shower head;Bedside commode;Wheelchair - Medical illustrator - single point Additional Comments: has lift chair    Prior Function Level of Independence: Needs assistance   Gait / Transfers Assistance Needed: recently unale to ambulate, required 2 assist from lift chair.           Hand Dominance   Dominant Hand:  (ambidextrous)    Extremity/Trunk Assessment   Upper Extremity Assessment Upper Extremity Assessment: Defer to OT evaluation    Lower Extremity Assessment Lower Extremity Assessment: Generalized weakness    Cervical / Trunk Assessment Cervical / Trunk  Assessment: Kyphotic  Communication   Communication: No difficulties (dementia at baseline)  Cognition  Arousal/Alertness: Awake/alert Behavior During Therapy: WFL for tasks assessed/performed Overall Cognitive Status: History of cognitive impairments - at baseline                                 General Comments: Before session, pt was heard from hallway yelling "help me!" loudly       General Comments General comments (skin integrity, edema, etc.): Pt incontinent but requested and used urinal during session.  Pt also had soiled brief.  When asked how he was, pt always replied pleasantly, "I'm OK."    Exercises     Assessment/Plan    PT Assessment Patient needs continued PT services  PT Problem List Decreased strength;Decreased activity tolerance;Decreased balance;Decreased mobility;Decreased coordination;Decreased cognition;Decreased knowledge of use of DME;Decreased safety awareness;Decreased knowledge of precautions;Pain       PT Treatment Interventions DME instruction;Gait training;Stair training;Functional mobility training;Therapeutic activities;Therapeutic exercise;Balance training;Neuromuscular re-education;Cognitive remediation;Patient/family education    PT Goals (Current goals can be found in the Care Plan section)  Acute Rehab PT Goals Time For Goal Achievement: 02/06/17 Potential to Achieve Goals: Fair    Frequency Min 5X/week   Barriers to discharge Decreased caregiver support Family available intermittently    Co-evaluation               End of Session Equipment Utilized During Treatment: Gait belt Activity Tolerance: Patient limited by pain Patient left: in chair;with call bell/phone within reach;with chair alarm set Nurse Communication: Mobility status PT Visit Diagnosis: Muscle weakness (generalized) (M62.81);Pain;History of falling (Z91.81) Pain - part of body:  (back)    Time: 6599-3570 PT Time Calculation (min) (ACUTE ONLY): 32 min   Charges:         PT G Codes:       Gaetano Net SPT  Gaetano Net 01/23/2017, 1:04 PM

## 2017-01-23 NOTE — Progress Notes (Signed)
Patient has baseline dementia.  Patient continues to call out "help me".  RN has to reorient patient to the fact that he is in the hospital.  Patient keeps stating he "needs to get out of here."  RN will continue to monitor patient

## 2017-01-23 NOTE — Evaluation (Addendum)
Occupational Therapy Evaluation Patient Details Name: Samuel Santiago MRN: [id number] DOB: 04-12-1929 Today's Date: 01/23/2017    History of Present Illness Pt is a 83 y.o. S/P L2 kyphoplasty.  PMH includes osteoporosis, back pain,  A fib, MI, HTN, GERD, dementia, CHF, CA, arthritis.   Clinical Impression   PTA Pt at assisted living facility while awaiting surgery and required max A for ADL. Pt currently max A for ADL and max A +2 for squat pivot transfers. Pt's family present and expressing the hopes of getting him back home with his wife. Pt will benefit from skilled OT in the acute setting and require SNF level therapy to maximize safety and independence in ADL and functional transfers. Next session to establish written generalized upper body HEP with Pt and family (orange theraband in room - provided this session).    Follow Up Recommendations  SNF;Supervision/Assistance - 24 hour    Equipment Recommendations  Other (comment) (defer to next venue of care)    Recommendations for Other Services       Precautions / Restrictions Precautions Precautions: Fall Restrictions Weight Bearing Restrictions: No Other Position/Activity Restrictions: Back precautions for comfort; educated daughter and wife      Mobility Bed Mobility               General bed mobility comments: Pt OOB in recliner when OT arrived  Transfers                 General transfer comment: Not attempted this session    Balance Overall balance assessment: Needs assistance Sitting-balance support: Bilateral upper extremity supported Sitting balance-Leahy Scale: Fair Sitting balance - Comments: strong posterior lean with sitting at first Postural control: Posterior lean;Left lateral lean                                 ADL either performed or assessed with clinical judgement   ADL Overall ADL's : Needs assistance/impaired Eating/Feeding: Minimal assistance;Sitting;With adaptive  utensils Eating/Feeding Details (indicate cue type and reason): Pt provided with red tubing to build up handles to address fatigue of hands during grooming/feeding Grooming: Moderate assistance;Sitting   Upper Body Bathing: Maximal assistance;Sitting   Lower Body Bathing: Total assistance   Upper Body Dressing : Maximal assistance   Lower Body Dressing: Total assistance   Toilet Transfer: Maximal assistance;+2 for physical assistance;+2 for safety/equipment;Squat-pivot;BSC Toilet Transfer Details (indicate cue type and reason): see PT note for transfer details   Toileting - Clothing Manipulation Details (indicate cue type and reason): incontent at baseline, Pt able to participate with front peri care during session with encouragement. OT provided assist to ensure thoroughness     Functional mobility during ADLs:  (not attempted this session, PT recommended use of STEDY)       Vision Patient Visual Report: No change from baseline       Perception     Praxis      Pertinent Vitals/Pain Pain Assessment: Faces Faces Pain Scale: Hurts even more Pain Location: surgical site Pain Descriptors / Indicators: Operative site guarding Pain Intervention(s): Limited activity within patient's tolerance     Hand Dominance  (ambidextrous)   Extremity/Trunk Assessment Upper Extremity Assessment Upper Extremity Assessment: Generalized weakness (grip fatuigues quickly during functional activities)   Lower Extremity Assessment Lower Extremity Assessment: Defer to PT evaluation   Cervical / Trunk Assessment Cervical / Trunk Assessment: Kyphotic   Communication Communication Communication: No difficulties (dementia at  baseline)   Cognition Arousal/Alertness: Awake/alert Behavior During Therapy: WFL for tasks assessed/performed Overall Cognitive Status: History of cognitive impairments - at baseline                                 General Comments: Before session, pt was  heard from hallway yelling "help me!" loudly    General Comments  Pt's daughter and wife in room during session. Pt with one episode of urinary incontinence during session, Pt cleaned up. Daughter asking about use of adult diapers deferred to RN    Exercises Exercises: General Upper Extremity General Exercises - Upper Extremity Shoulder Flexion: AROM;Both;5 reps;Theraband Theraband Level (Shoulder Flexion): Other (comment) (orange) Elbow Flexion: AROM;Both;5 reps;Theraband Theraband Level (Elbow Flexion): Other (comment) (orange) Elbow Extension: AROM;Both;5 reps;Theraband   Shoulder Instructions      Home Living Family/patient expects to be discharged to:: Private residence Living Arrangements: Spouse/significant other Available Help at Discharge: Family;Available PRN/intermittently Type of Home: House Home Access: Stairs to enter [employer] of Steps: 4 Entrance Stairs-Rails: Right;Left;Can reach both Home Layout: One level     Bathroom Shower/Tub: Teacher, early years/pre: Handicapped height Bathroom Accessibility: Yes How Accessible: Accessible via walker Home Equipment: [place] - 2 wheels;Walker - 4 wheels;Tub bench;Hand held shower head;Bedside commode;Wheelchair - Medical illustrator - single point   Additional Comments: has lift chair      Prior Functioning/Environment Level of Independence: Needs assistance  Gait / Transfers Assistance Needed: recently unale to ambulate, required 2 assist from lift chair.              OT Problem List: Decreased strength;Decreased activity tolerance;Impaired balance (sitting and/or standing);Decreased cognition;Decreased safety awareness;Decreased knowledge of use of DME or AE;Pain      OT Treatment/Interventions: Self-care/ADL training;Therapeutic exercise;DME and/or AE instruction;Therapeutic activities;Patient/family education;Balance training;Energy conservation    OT  Goals(Current goals can be found in the care plan section) Acute Rehab OT Goals Patient Stated Goal: to attend his grandaughters wedding in a month OT Goal Formulation: With patient/family Time For Goal Achievement: 02/06/17 Potential to Achieve Goals: Good ADL Goals Pt Will Perform Grooming: with min guard assist;with caregiver independent in assisting;sitting Pt Will Perform Upper Body Bathing: with min guard assist;with caregiver independent in assisting;with adaptive equipment;sitting Pt Will Perform Lower Body Bathing: with min guard assist;with caregiver independent in assisting;with adaptive equipment;sitting/lateral leans Pt Will Transfer to Toilet: with mod assist;stand pivot transfer;bedside commode Pt Will Perform Toileting - Clothing Manipulation and hygiene: with mod assist;with caregiver independent in assisting;sit to/from stand Additional ADL Goal #1: Pt will recall 2 of 3 back precautions (for comfort) with 1 verbal cue  OT Frequency: Min 2X/week   Barriers to D/C: Decreased caregiver support  Pt lives with wife who is not able to physically care for him in his current state of need       Co-evaluation              End of Session Equipment Utilized During Treatment: Other (comment) (red tubing for building up handles) Nurse Communication: Mobility status (Pt would be ready to return to bed in about 10 min)  Activity Tolerance: Patient tolerated treatment well Patient left: in chair;with chair alarm set;with call bell/phone within reach;with family/visitor present  OT Visit Diagnosis: Muscle weakness (generalized) (M62.81);Other symptoms and signs involving cognitive function;Other abnormalities of gait and mobility (R26.89);History of falling (Z91.81)  Time: 9507-2257 OT Time Calculation (min): 53 min Charges:  OT General Charges $OT Visit: 1 Procedure OT Evaluation $OT Eval Moderate Complexity: 1 Procedure OT Treatments $Self Care/Home  Management : 23-37 mins $Therapeutic Activity: 8-22 mins G-Codes:     Hulda Humphrey OTR/L [phone]  Mer[place] Ray Lonna Rabold Feb 20, 2017, 5:07 PM     2017/02/20 1600  OT G-codes **NOT FOR INPATIENT CLASS**  Functional Assessment Tool Used AM-PAC 6 Clicks Daily Activity  Functional Limitation Self care  Self Care Current Status (D0518) CL  Self Care Goal Status (Z3582) CJ   Addendum: Late G code

## 2017-01-24 DIAGNOSIS — M8008XA Age-related osteoporosis with current pathological fracture, vertebra(e), initial encounter for fracture: Secondary | ICD-10-CM | POA: Diagnosis not present

## 2017-01-24 LAB — BASIC METABOLIC PANEL
Anion gap: 8 (ref 5–15)
BUN: 10 mg/dL (ref 6–20)
CHLORIDE: 102 mmol/L (ref 101–111)
CO2: 25 mmol/L (ref 22–32)
CREATININE: 0.8 mg/dL (ref 0.61–1.24)
Calcium: 9.3 mg/dL (ref 8.9–10.3)
GFR calc non Af Amer: >60 mL/min (ref >60)
GLUCOSE: 85 mg/dL (ref 65–99)
Potassium: 4.2 mmol/L (ref 3.5–5.1)
Sodium: 135 mmol/L (ref 135–145)

## 2017-01-24 MED ORDER — ONDANSETRON HCL 4 MG PO TABS
4.0000 mg | ORAL_TABLET | ORAL | Status: DC | PRN
  Administered 2017-01-24 – 2017-01-25 (×2): 4 mg via ORAL
  Filled 2017-01-24 (×2): qty 1

## 2017-01-24 NOTE — Progress Notes (Signed)
Patient with incontinence bowel and bladder.  Patient vomited once during cleaning and changing.

## 2017-01-24 NOTE — Care Management Obs Status (Signed)
[place] NOTIFICATION   Patient Details  Name: Samuel Santiago MRN: [id number] Date of Birth: 07/17/1929   Medicare Observation Status Notification Given:  Yes    Carles Collet, RN 01/24/2017, 10:56 AM

## 2017-01-24 NOTE — Progress Notes (Signed)
Subjective: 2 Days Post-Op Procedure(s) (LRB): KYPHOPLASTY L2 (N/A)  Patient reports pain as mild to moderate.  Tolerating POs well.  Admits to flatulence.  Denies fever, chills, N/V, CP, SOB.  Objective:   VITALS:  Temp:  [97.5 F (36.4 C)-98.7 F (37.1 C)] 98.2 F (36.8 C) (04/07 0504) Pulse Rate:  [64-70] 67 (04/07 0504) Resp:  [16-20] 20 (04/07 0504) BP: (140-159)/(70-84) 145/84 (04/07 0504) SpO2:  [96 %-99 %] 96 % (04/07 0504)  General: WDWN patient in NAD. Psych:  Appropriate mood and affect. Neuro:  A&O x 3, Moving all extremities, sensation intact to light touch HEENT:  EOMs intact Chest:  Even non-labored respirations Skin:  Dressing C/D/I, no rashes or lesions Extremities: warm/dry, mild edema, no erythema or echymosis.  No lymphadenopathy. Pulses: Popliteus 2+ MSK:  ROM: full ankle and knee ROM bilaterally, MMT: patient is able to perform quad set bilaterally.    LABS No results for input(s): HGB, WBC, PLT in the last 72 hours. No results for input(s): NA, K, CL, CO2, BUN, CREATININE, GLUCOSE in the last 72 hours.  Recent Labs  01/22/17 0851  INR 1.01     Assessment/Plan: 2 Days Post-Op Procedure(s) (LRB): KYPHOPLASTY L2 (N/A)  Patient seen in rounds on behalf of Dr. Rolena Infante. Up with therapy. Awaiting SNF placement. Plan for 2 week outpatient post-op visit with Dr. Rolena Infante. Scripts on chart.   Mechele Claude, PA-C [facility] Office:  [phone]

## 2017-01-24 NOTE — Care Management CC44 (Signed)
Condition Code 44 Documentation Completed  Patient Details  Name: Samuel Santiago MRN: [id number] Date of Birth: Aug 27, 1929   Condition Code 44 given:  Yes Patient signature on Condition Code 44 notice:  Yes Documentation of 2 MD's agreement:  Yes Code 44 added to claim:  Yes    Carles Collet, RN 01/24/2017, 10:56 AM

## 2017-01-25 DIAGNOSIS — M8008XA Age-related osteoporosis with current pathological fracture, vertebra(e), initial encounter for fracture: Secondary | ICD-10-CM | POA: Diagnosis not present

## 2017-01-25 DIAGNOSIS — S32010D Wedge compression fracture of first lumbar vertebra, subsequent encounter for fracture with routine healing: Secondary | ICD-10-CM | POA: Diagnosis not present

## 2017-01-25 DIAGNOSIS — S3992XA Unspecified injury of lower back, initial encounter: Secondary | ICD-10-CM | POA: Diagnosis not present

## 2017-01-25 DIAGNOSIS — M810 Age-related osteoporosis without current pathological fracture: Secondary | ICD-10-CM | POA: Diagnosis not present

## 2017-01-25 DIAGNOSIS — S12100A Unspecified displaced fracture of second cervical vertebra, initial encounter for closed fracture: Secondary | ICD-10-CM | POA: Diagnosis not present

## 2017-01-25 DIAGNOSIS — I2510 Atherosclerotic heart disease of native coronary artery without angina pectoris: Secondary | ICD-10-CM | POA: Diagnosis not present

## 2017-01-25 DIAGNOSIS — I480 Paroxysmal atrial fibrillation: Secondary | ICD-10-CM | POA: Diagnosis not present

## 2017-01-25 DIAGNOSIS — M545 Low back pain: Secondary | ICD-10-CM | POA: Diagnosis not present

## 2017-01-25 DIAGNOSIS — G308 Other Alzheimer's disease: Secondary | ICD-10-CM | POA: Diagnosis not present

## 2017-01-25 DIAGNOSIS — Z7901 Long term (current) use of anticoagulants: Secondary | ICD-10-CM | POA: Diagnosis not present

## 2017-01-25 DIAGNOSIS — I10 Essential (primary) hypertension: Secondary | ICD-10-CM | POA: Diagnosis not present

## 2017-01-25 NOTE — Progress Notes (Addendum)
Pt is medically stable for D/C to [facility].  CSW will call and arrange EMS for transport.    Per admissions coordinator at [facility] pt will go to room 102.    CSW sent D/C orders to [facility] (admissions person) via the hub.   # For report is [phone].   CSW contacted/spoke to the pt's wife and daughter and they are aware of the D/C plan.  Pt's wife is in agreement with the plan.  Please reconsult with CSW in future if needed.   Alphonse Guild. Dois Juarbe, LCSWA, LCAS

## 2017-01-25 NOTE — Progress Notes (Signed)
Call report given to [facility].  Patient transferred to stretcher to bed.  Family at bedside.

## 2017-01-25 NOTE — Progress Notes (Addendum)
CSW was informed by pt's CM [place] pt is ready for D/C, per the Medical Director and that the Medical Director states a Letter of  Guarantee will be provided by the director, per CM Rod Holler Observation Utilization Review CM to [facility] if necessary to D/C pt on 4/8.  CSW had written previous note stating pt can not be D/C's, per notes, back to [facility] until an auth# is obtained from [place] with PT recommendation results.  CSW attempted to reach facility main number at ph: [phone].  CSW  attempted to reach Admission coordinator Bryson Ha at [phone] and  attempted to reach Admission coordinator Benjamine Mola at [phone] and left HIPPA-compliant VM's requesting return calls and outlining the need to D/C pt back to [facility] Sunday 4/8.  CSW stated on VM's pt can be D/C'd with a Letter of Guarantee provided by the Medical Director, per Paulita Cradle CM and [place].  Rod Holler informed CSW Market researcher stated pt needs to be D/C's on 4/8.   CSW awaiting return calls presently.  Alphonse Guild. Judy Pollman, Latanya Presser, LCAS Clinical Social Worker Ph: [phone]

## 2017-01-25 NOTE — Progress Notes (Addendum)
CSW received call from [place] at [place] at ph: [phone] who stated they were not given PT results until this weekend but have placed a request for an insurance auth # and are awaiting a return call from [facility] who does not open until Monday 4/9.    CSW reiterated  Medical Director is willing to provide a Letter of Guarantee to D/C pt today Sunday 4/9. Heather at [facility] stated they normally do not take this but would check with facility director.  1:03 PM Heather called back stated they needed to know how many days the LOG would cover and need a D/C summary by 1:30pm-2pm. CSW attempted to call Medical Director and CSW Asst Director.  Awaiting return call now.  1:33 PM CSW received call-back from [facility] Asst Director who stated he will provide a LOG for 5 days.  CSW called Heather at [facility]. [facility] And left VM.  CSW made attempt to call pt's provider for needed D/C summary and was given provider name of Dr. Rolena Infante by pt's RN at ph: [phone].  CSW still on hold with [facility] at his time attempting to reach number of provider.  CSW again requested [place] physician from pt's RN and was told Dr. Rolena Infante is either covering physician or that [facility] answering service will provide number of covering provider.  CSW still on hold presently.    Alphonse Guild. Emalee Knies, Latanya Presser, LCAS Clinical Social Worker Ph: [phone]

## 2017-01-25 NOTE — Progress Notes (Signed)
Per notes, pt needs an Ship broker # from [place] to D/C pt to [facility].  Per notes, [employer] iis waiting on PT EVAL.  CSW called [employer] and office is closed until Monday 4/9.  Alphonse Guild. Mariaha Ellington, Latanya Presser, LCAS Clinical Social Worker Ph: [phone]

## 2017-01-25 NOTE — Care Management Note (Signed)
Case Management Note  Patient Details  Name: Samuel Santiago MRN: [id number] Date of Birth: [birth year]  Subjective/Objective: 83 y.o. M s/p L2 Kyphoplasty secondary to compression Fx awaiting SNF placement. CM LM for CSW to follow up as no note has been entered and PAC mentioned to CM.                     Action/Plan: CSW will begin workup ASAP.CM will sign off for now but will be available should additional discharge needs arise or disposition change.    Expected Discharge Date:                  Expected Discharge Plan:  Skilled Nursing Facility  In-House Referral:  Clinical Social Work  Discharge planning Services  CM Consult  Post Acute Care Choice:    Choice offered to:  NA  DME Arranged:    DME Agency:     HH Arranged:    [place] Agency:     Status of Service:  In process, will continue to follow  If discussed at Long Length of Stay Meetings, dates discussed:    Additional Comments:  Delrae Sawyers, RN 01/25/2017, 11:26 AM

## 2017-01-25 NOTE — Discharge Summary (Signed)
Physician Discharge Summary   Patient ID: Samuel Santiago MRN: [id number] DOB/AGE: [birth year] 83 y.o.  Admit date: 01/22/2017 Discharge date: 01/25/2017  Primary Diagnosis:   L2 Osteoporstic compression  Admission Diagnoses:  Past Medical History:  Diagnosis Date  . Arthritis   . CHF (congestive heart failure) ([place])    EF 50-55% 2016  . Chronic anticoagulation   . Colon cancer ([place]) 1990   Had recurrence in 2003 resultinbg in chemotherapy. He recently had a colonoscopy and  had a polyp removed that was benign.  . Coronary artery disease   . Dementia   . Depression   . Dysrhythmia    Atrial fib  . GERD (gastroesophageal reflux disease)   . History of colon polyps   . History of kidney stones   . History of nephrolithiasis 01/23/2012  . Hyperlipidemia   . Hypertension   . Ischemic heart disease    has had remote MI in 1997 and was treated with TPA. Prior PCI to RCA with repeat PCI to the RCA in 2003  . Kidney stone    x11  . Myocardial infarction    x2  . Normal nuclear stress test Feb 2012   No significant ischemia. EF 56%; with basal inferior scar  . PAF (paroxysmal atrial fibrillation) [facility])    Discharge Diagnoses:   Active Problems:   Compression fracture of L2 (HCC)  Procedure:  Procedure(s) (LRB): KYPHOPLASTY L2 (N/A)   Consults: None  HPI:  see H&P    Laboratory Data: Hospital Outpatient Visit on 01/15/2017  Component Date Value Ref Range Status  . MRSA, PCR 01/15/2017 NEGATIVE  NEGATIVE Final  . Staphylococcus aureus 01/15/2017 NEGATIVE  NEGATIVE Final   Comment:        The Xpert SA Assay (FDA approved for NASAL specimens in patients over 67 years of age), is one component of a comprehensive surveillance program.  Test performance has been validated by [facility] for patients greater than or equal to 35 year old. It is not intended to diagnose infection nor to guide or monitor treatment.   . Sodium 01/15/2017 138  135 - 145 mmol/L Final  .  Potassium 01/15/2017 3.3* 3.5 - 5.1 mmol/L Final  . Chloride 01/15/2017 103  101 - 111 mmol/L Final  . CO2 01/15/2017 25  22 - 32 mmol/L Final  . Glucose, Bld 01/15/2017 157* 65 - 99 mg/dL Final  . BUN 01/15/2017 25* 6 - 20 mg/dL Final  . Creatinine, Ser 01/15/2017 0.94  0.61 - 1.24 mg/dL Final  . Calcium 01/15/2017 9.8  8.9 - 10.3 mg/dL Final  . GFR calc non Af Amer 01/15/2017 >60  >60 mL/min Final  . GFR calc Af Amer 01/15/2017 >60  >60 mL/min Final   Comment: (NOTE) The eGFR has been calculated using the CKD EPI equation. This calculation has not been validated in all clinical situations. eGFR's persistently <60 mL/min signify possible Chronic Kidney Disease.   . Anion gap 01/15/2017 10  5 - 15 Final  . WBC 01/15/2017 11.5* 4.0 - 10.5 K/uL Final  . RBC 01/15/2017 4.01* 4.22 - 5.81 MIL/uL Final  . Hemoglobin 01/15/2017 14.1  13.0 - 17.0 g/dL Final  . HCT 01/15/2017 40.9  39.0 - 52.0 % Final  . MCV 01/15/2017 102.0* 78.0 - 100.0 fL Final  . MCH 01/15/2017 35.2* 26.0 - 34.0 pg Final  . MCHC 01/15/2017 34.5  30.0 - 36.0 g/dL Final  . RDW 01/15/2017 13.6  11.5 - 15.5 % Final  .  Platelets 01/15/2017 148* 150 - 400 K/uL Final   No results for input(s): HGB in the last 72 hours. No results for input(s): WBC, RBC, HCT, PLT in the last 72 hours.  Recent Labs  01/24/17 1248  NA 135  K 4.2  CL 102  CO2 25  BUN 10  CREATININE 0.80  GLUCOSE 85  CALCIUM 9.3   No results for input(s): LABPT, INR in the last 72 hours.  X-Rays:Dg Lumbar Spine 2-3 Views  Result Date: 01/22/2017 CLINICAL DATA:  Post L2 kyphoplasty EXAM: LUMBAR SPINE - 2-3 VIEW; DG C-ARM 61-120 MIN COMPARISON:  12/04/2016 FINDINGS: Two views of the lumbar spine submitted. Again noted compression deformity L2 vertebral body with interval vertebroplasty. The alignment is preserved. IMPRESSION: Vertebroplasty at L2 level.  The alignment is preserved. Fluoroscopy time was 86 seconds.  Please see the operative report.  Electronically Signed   By: Lahoma Crocker M.D.   On: 01/22/2017 11:09   Dg C-arm 1-60 Min  Result Date: 01/22/2017 CLINICAL DATA:  Post L2 kyphoplasty EXAM: LUMBAR SPINE - 2-3 VIEW; DG C-ARM 61-120 MIN COMPARISON:  12/04/2016 FINDINGS: Two views of the lumbar spine submitted. Again noted compression deformity L2 vertebral body with interval vertebroplasty. The alignment is preserved. IMPRESSION: Vertebroplasty at L2 level.  The alignment is preserved. Fluoroscopy time was 86 seconds.  Please see the operative report. Electronically Signed   By: Lahoma Crocker M.D.   On: 01/22/2017 11:09    EKG: Orders placed or performed during the hospital encounter of 05/08/16  . EKG 12-Lead  . EKG 12-Lead  . ED EKG  . ED EKG  . ED EKG  . ED EKG  . EKG 12-Lead  . EKG 12-Lead  . EKG  . EKG     Hospital Course: Patient was admitted to [facility] and taken to the OR and underwent the above state procedure without complications.  Patient tolerated the procedure well and was later transferred to the recovery room and then to the orthopaedic floor for postoperative care.  They were given PO and IV analgesics for pain control following their surgery.  They were given 24 hours of postoperative antibiotics.   PT was consulted postop to assist with mobility and transfers.  The patient was allowed to be WBAT with therapy and was taught back precautions. Discharge planning was consulted to help with postop disposition and equipment needs.  Patient had a fair night on the evening of surgery and started to get up OOB with therapy on day one. Patient was seen in rounds and was ready to go home on day three.  They were given discharge instructions and dressing directions.  They were instructed on when to follow up in the office with Dr. Rolena Infante.   Diet: Regular diet- heart healthy Activity:WBAT- Lspine precautions Follow-up:in 10-14 days Disposition - Skilled nursing facility Discharged Condition: good   Discharge  Instructions    Call MD / Call 911    Complete by:  As directed    If you experience chest pain or shortness of breath, CALL 911 and be transported to the hospital emergency room.  If you develope a fever above 101 F, pus (white drainage) or increased drainage or redness at the wound, or calf pain, call your surgeon's office.   Constipation Prevention    Complete by:  As directed    Drink plenty of fluids.  Prune juice may be helpful.  You may use a stool softener, such as Colace (over the  counter) 100 mg twice a day.  Use MiraLax (over the counter) for constipation as needed.   Diet - low sodium heart healthy    Complete by:  As directed    Increase activity slowly as tolerated    Complete by:  As directed      Allergies as of 01/25/2017      Reactions   Tape Other (See Comments)   Pulled off skin "it really sticks to me" >  will probably tolerate paper tape       Medication List    STOP taking these medications   enoxaparin 120 MG/0.8ML injection Commonly known as:  LOVENOX     TAKE these medications   acetaminophen 500 MG tablet Commonly known as:  TYLENOL Take 1,000 mg by mouth every 6 (six) hours as needed (pain).   aspirin 81 MG chewable tablet Chew 1 tablet (81 mg total) by mouth daily.   atorvastatin 10 MG tablet Commonly known as:  LIPITOR Take 1 tablet (10 mg total) by mouth daily. What changed:  when to take this   calcitonin (salmon) 200 UNIT/ACT nasal spray Commonly known as:  MIACALCIN/FORTICAL Place 1 spray into alternate nostrils daily.   cholecalciferol 1000 units tablet Commonly known as:  VITAMIN D Take 5,000 Units by mouth daily.   diazepam 5 MG tablet Commonly known as:  VALIUM Take 5 mg by mouth every 8 (eight) hours as needed for muscle spasms.   divalproex 250 MG 24 hr tablet Commonly known as:  DEPAKOTE ER 1 qam for 1 week then 2  qam What changed:  how much to take  how to take this  when to take this  additional instructions     donepezil 5 MG tablet Commonly known as:  ARICEPT Take 1 tablet (5 mg total) by mouth at bedtime.   erythromycin ophthalmic ointment Place 1 application into both eyes daily as needed (infection).   FISH OIL PO Take 1 capsule by mouth daily.   GLUCOSAMINE-CHONDROITIN PO Take 1 tablet by mouth daily.   hydrochlorothiazide 25 MG tablet Commonly known as:  HYDRODIURIL TAKE 1 TABLET BY MOUTH ONCE DAILY   HYDROcodone-acetaminophen 5-325 MG tablet Commonly known as:  NORCO/VICODIN Take 1 tablet by mouth every 6 (six) hours as needed for moderate pain. What changed:  how much to take  reasons to take this   losartan 100 MG tablet Commonly known as:  COZAAR TAKE 1 TABLET BY MOUTH DAILY   metoprolol tartrate 25 MG tablet Commonly known as:  LOPRESSOR TAKE ONE TABLET BY MOUTH TWICE DAILY   multivitamin with minerals Tabs tablet Take 1 tablet by mouth daily.   omeprazole 40 MG capsule Commonly known as:  PRILOSEC Take 40 mg by mouth at bedtime.   ondansetron 4 MG tablet Commonly known as:  ZOFRAN Take 4 mg by mouth every 6 (six) hours as needed for nausea or vomiting. What changed:  Another medication with the same name was added. Make sure you understand how and when to take each.   ondansetron 4 MG tablet Commonly known as:  ZOFRAN Take 1 tablet (4 mg total) by mouth every 8 (eight) hours as needed for nausea or vomiting. What changed:  You were already taking a medication with the same name, and this prescription was added. Make sure you understand how and when to take each.   polyethylene glycol packet Commonly known as:  MIRALAX / GLYCOLAX Take 17 g by mouth daily. Hold for diarrhea   REFRESH  1.4-0.6 % ophthalmic solution Generic drug:  polyvinyl alcohol-povidone Place 1 drop into both eyes 4 (four) times daily as needed (dry eyes).   senna-docusate 8.6-50 MG tablet Commonly known as:  SENOKOT S Take 2 tablets by mouth at bedtime as needed for mild  constipation. For constipation. Available over-the-counter. What changed:  how much to take  when to take this  additional instructions   sertraline 100 MG tablet Commonly known as:  ZOLOFT Half a pill qam  For 6 days then 1  qam What changed:  how much to take  how to take this  when to take this  additional instructions   warfarin 1 MG tablet Commonly known as:  COUMADIN Take 2 mg by mouth at bedtime.      Follow-up Information    Dahlia Bailiff, MD. Schedule an appointment as soon as possible for a visit in 2 weeks.   Specialty:  Orthopedic Surgery Why:  If symptoms worsen, For suture removal, For wound re-check Contact information: [address] [place] [address] [phone]           Signed: Lacie Draft, PA-C Orthopaedic Surgery 01/25/2017, 1:53 PM

## 2017-01-25 NOTE — Progress Notes (Signed)
CSW provided active listening and validated pt's daughter;s concern as she ventilated frustration due to pt's Sunday D/C.  Pt's daughter requested CSW locate pt's wheelchair and [facility] lift pad.  CSW found items in PACU and transported items to pt's room.  Pt's daughter will transport wheelchair and items in her car. Please reconsult if future social work needs arise.  CSW signing off.  Samuel Santiago. Hymie Gorr, Latanya Presser, LCAS Clinical Social Worker Ph: [phone]

## 2017-01-25 NOTE — Progress Notes (Signed)
Subjective: 3 Days Post-Op Procedure(s) (LRB): KYPHOPLASTY L2 (N/A) Patient reports pain as mild.  Minimal pain well controlled. No c/o. Awaiting SNF placement  Objective: Vital signs in last 24 hours: Temp:  [97.4 F (36.3 C)-98.1 F (36.7 C)] 97.7 F (36.5 C) (04/08 0636) Pulse Rate:  [58-70] 58 (04/08 0636) Resp:  [16-20] 16 (04/08 0636) BP: (129-143)/(58-84) 142/64 (04/08 0636) SpO2:  [95 %-99 %] 96 % (04/08 0636)  Intake/Output from previous day: 04/07 0701 - 04/08 0700 In: 1110 [P.O.:600; I.V.:510] Out: -  Intake/Output this shift: No intake/output data recorded.  No results for input(s): HGB in the last 72 hours. No results for input(s): WBC, RBC, HCT, PLT in the last 72 hours.  Recent Labs  01/24/17 1248  NA 135  K 4.2  CL 102  CO2 25  BUN 10  CREATININE 0.80  GLUCOSE 85  CALCIUM 9.3   No results for input(s): LABPT, INR in the last 72 hours.  Neurologically intact ABD soft Neurovascular intact Sensation intact distally Intact pulses distally Dorsiflexion/Plantar flexion intact Incision: dressing C/D/I and no drainage No cellulitis present  Compartments soft No sign of DVT  Assessment/Plan: 3 Days Post-Op Procedure(s) (LRB): KYPHOPLASTY L2 (N/A) Advance diet Up with therapy  D/C to SNF when approved/bed available- if this occurs today please call me at [phone] for D/C order  Daleisa Halperin, Ellison Hughs M. 01/25/2017, 9:14 AM

## 2017-01-25 NOTE — NC FL2 (Signed)
[place] LEVEL OF CARE SCREENING TOOL     IDENTIFICATION  Patient Name: Samuel Santiago HENIDPO Birthdate: [birth year] Sex: male Admission Date (Current Location): 01/22/2017  [facility] and [place] Number:  Herbalist and Address:  [facility] [place]. [facility], [place] [address], [place], [place] [address]      Provider Number: [id number]  Attending Physician Name and Address:  Melina Schools, MD  Relative Name and Phone Number:       Current Level of Care: Hospital Recommended Level of Care: [place] Prior Approval Number:    Date Approved/Denied:   PASRR Number: [id number]  Discharge Plan: SNF    Current Diagnoses: Patient Active Problem List   Diagnosis Date Noted  . Compression fracture of L2 ([place]) 01/22/2017  . Compression fracture of L2 lumbar vertebra ([place]) 11/27/2016  . Spinal stenosis at L4-L5 level 11/27/2016  . Hypernatremia 10/09/2016  . Edema 08/27/2016  . Pulmonary nodules 05/09/2016  . Weakness 05/08/2016  . Dyspnea 05/08/2016  . Paranoid delusion ([place]) 03/26/2016  . Dementia 11/19/2015  . TIA (transient ischemic attack) 11/19/2015  . Leukocytosis   . HLD (hyperlipidemia)   . Receptive aphasia 11/18/2015  . Anticoagulation management encounter 10/16/2015  . Atrial fibrillation with rapid ventricular response ([place])   . New onset atrial fibrillation ([place]) 09/18/2015  . Atrial fibrillation ([place]) 09/18/2015  . Abrasion of arm, left 04/19/2015  . Cognitive deficits 04/19/2015  . Memory loss 03/31/2014  . Asthmatic bronchitis 03/17/2014  . Stress 07/26/2012  . Insomnia 03/24/2012  . History of nephrolithiasis 01/23/2012  . Macrocytosis 06/02/2011  . Ataxia 06/02/2011  . BLEPHARITIS 12/02/2010  . CYSTITIS 12/02/2010  . CBC, ABNORMAL 08/07/2009  . TOBACCO USE, QUIT 08/07/2009  . Dyslipidemia 12/06/2007  . Essential hypertension 12/06/2007  . Coronary atherosclerosis 12/06/2007  . Rosacea 12/06/2007  .  Osteoarthritis 12/06/2007  . COLON CANCER, HX OF 12/06/2007  . COLONIC POLYPS, HX OF 12/06/2007    Orientation RESPIRATION BLADDER Height & Weight        Normal (Room air) Incontinent Weight: 159 lb (72.1 kg) Height:  5\' 4"  (162.6 cm)  BEHAVIORAL SYMPTOMS/MOOD NEUROLOGICAL BOWEL NUTRITION STATUS      Incontinent Diet (Heart Healthy)  AMBULATORY STATUS COMMUNICATION OF NEEDS Skin   Extensive Assist Verbally Surgical wounds (Back, incision, clean, dry, intact)                       Personal Care Assistance Level of Assistance  Bathing, Dressing Bathing Assistance: Limited assistance   Dressing Assistance: Limited assistance     Functional Limitations Info             SPECIAL CARE FACTORS FREQUENCY  PT (By licensed PT), OT (By licensed OT)     PT Frequency: 5 OT Frequency: 5            Contractures      Additional Factors Info  Code Status, Allergies (Prior) Code Status Info: Prior Allergies Info: Tape           Current Medications (01/25/2017):  This is the current hospital active medication list Current Facility-Administered Medications  Medication Dose Route Frequency Provider Last Rate Last Dose  . diazepam (VALIUM) tablet 5 mg  5 mg Oral Q8H PRN Melina Schools, MD   5 mg at 01/24/17 2154  . divalproex (DEPAKOTE ER) 24 hr tablet 500 mg  500 mg Oral Q breakfast Melina Schools, MD   500 mg at 01/25/17  0940  . donepezil (ARICEPT) tablet 5 mg  5 mg Oral QHS Melina Schools, MD   5 mg at 01/24/17 2154  . hydrochlorothiazide (HYDRODIURIL) tablet 25 mg  25 mg Oral Daily Melina Schools, MD   25 mg at 01/25/17 0940  . lactated ringers infusion   Intravenous Continuous Melina Schools, MD 85 mL/hr at 01/25/17 0206    . losartan (COZAAR) tablet 100 mg  100 mg Oral Daily Melina Schools, MD   100 mg at 01/25/17 0940  . metoprolol tartrate (LOPRESSOR) tablet 25 mg  25 mg Oral BID Melina Schools, MD   25 mg at 01/25/17 0940  . ondansetron (ZOFRAN) tablet 4 mg  4 mg Oral Q4H  PRN Melina Schools, MD   4 mg at 01/25/17 1404  . sertraline (ZOLOFT) tablet 100 mg  100 mg Oral Q breakfast Melina Schools, MD   100 mg at 01/25/17 0940  . traMADol (ULTRAM) tablet 50 mg  50 mg Oral Q6H PRN Melina Schools, MD   50 mg at 01/25/17 1404     Discharge Medications: Please see discharge summary for a list of discharge medications.  Relevant Imaging Results:  Relevant Lab Results:   Additional Information [id number]  Alphonse Guild Adger Cantera, LCSWA

## 2017-01-25 NOTE — Progress Notes (Signed)
CSW called [facility] and updated pt's daughter.  Alphonse Guild. Tyberius Ryner, Latanya Presser, LCAS Clinical Social Worker Ph: [phone]

## 2017-01-25 NOTE — Progress Notes (Signed)
Patient transferred from bed to stretcher.  Left unit with transport service [facility].  Family removed belongings.

## 2017-01-26 ENCOUNTER — Telehealth: Payer: Self-pay | Admitting: *Deleted

## 2017-01-26 DIAGNOSIS — M545 Low back pain: Secondary | ICD-10-CM | POA: Diagnosis not present

## 2017-01-26 NOTE — Telephone Encounter (Signed)
Pt was on the TCM list admitted for L2 Osteoporstic compression. He had a KYPHOPLASTY L2 (N/A), and was D/C 4/8 and will follow-up w/ [facility] w/Dr. Rolena Infante....Johny Chess

## 2017-01-27 NOTE — Progress Notes (Signed)
Physical Therapy Evaluation Addendum for G-Codes    February 02, 2017 1300  Restrictions  Weight Bearing Restrictions No  PT G-Codes **NOT FOR INPATIENT CLASS**  Functional Assessment Tool Used AM-PAC 6 Clicks Basic Mobility;Clinical judgement  Functional Limitation Mobility: Walking and moving around  Mobility: Walking and Moving Around Current Status (I5038) CM  Mobility: Walking and Moving Around Goal Status (U8280) CL   Rolinda Roan, PT, DPT Acute Rehabilitation Services Pager: [phone]

## 2017-01-30 DIAGNOSIS — R69 Illness, unspecified: Secondary | ICD-10-CM | POA: Diagnosis not present

## 2017-01-30 DIAGNOSIS — I2510 Atherosclerotic heart disease of native coronary artery without angina pectoris: Secondary | ICD-10-CM | POA: Diagnosis not present

## 2017-01-30 DIAGNOSIS — Z5189 Encounter for other specified aftercare: Secondary | ICD-10-CM | POA: Diagnosis not present

## 2017-01-30 DIAGNOSIS — S32020D Wedge compression fracture of second lumbar vertebra, subsequent encounter for fracture with routine healing: Secondary | ICD-10-CM | POA: Diagnosis not present

## 2017-02-02 DIAGNOSIS — I110 Hypertensive heart disease with heart failure: Secondary | ICD-10-CM | POA: Diagnosis not present

## 2017-02-02 DIAGNOSIS — R69 Illness, unspecified: Secondary | ICD-10-CM | POA: Diagnosis not present

## 2017-02-02 DIAGNOSIS — R1310 Dysphagia, unspecified: Secondary | ICD-10-CM | POA: Diagnosis not present

## 2017-02-02 DIAGNOSIS — I2510 Atherosclerotic heart disease of native coronary artery without angina pectoris: Secondary | ICD-10-CM | POA: Diagnosis not present

## 2017-02-02 DIAGNOSIS — M48061 Spinal stenosis, lumbar region without neurogenic claudication: Secondary | ICD-10-CM | POA: Diagnosis not present

## 2017-02-02 DIAGNOSIS — I509 Heart failure, unspecified: Secondary | ICD-10-CM | POA: Diagnosis not present

## 2017-02-02 DIAGNOSIS — M8008XD Age-related osteoporosis with current pathological fracture, vertebra(e), subsequent encounter for fracture with routine healing: Secondary | ICD-10-CM | POA: Diagnosis not present

## 2017-02-02 DIAGNOSIS — I480 Paroxysmal atrial fibrillation: Secondary | ICD-10-CM | POA: Diagnosis not present

## 2017-02-03 ENCOUNTER — Telehealth: Payer: Self-pay | Admitting: Internal Medicine

## 2017-02-03 DIAGNOSIS — I110 Hypertensive heart disease with heart failure: Secondary | ICD-10-CM | POA: Diagnosis not present

## 2017-02-03 DIAGNOSIS — M48061 Spinal stenosis, lumbar region without neurogenic claudication: Secondary | ICD-10-CM | POA: Diagnosis not present

## 2017-02-03 DIAGNOSIS — R1310 Dysphagia, unspecified: Secondary | ICD-10-CM | POA: Diagnosis not present

## 2017-02-03 DIAGNOSIS — R69 Illness, unspecified: Secondary | ICD-10-CM | POA: Diagnosis not present

## 2017-02-03 DIAGNOSIS — M8008XD Age-related osteoporosis with current pathological fracture, vertebra(e), subsequent encounter for fracture with routine healing: Secondary | ICD-10-CM | POA: Diagnosis not present

## 2017-02-03 DIAGNOSIS — I2510 Atherosclerotic heart disease of native coronary artery without angina pectoris: Secondary | ICD-10-CM | POA: Diagnosis not present

## 2017-02-03 DIAGNOSIS — I509 Heart failure, unspecified: Secondary | ICD-10-CM | POA: Diagnosis not present

## 2017-02-03 DIAGNOSIS — I480 Paroxysmal atrial fibrillation: Secondary | ICD-10-CM | POA: Diagnosis not present

## 2017-02-03 NOTE — Telephone Encounter (Signed)
Unable to reach, will try later to give okay.

## 2017-02-03 NOTE — Telephone Encounter (Signed)
Santiago Glad from [facility]  [phone]  Jenetta Downer for home care Speech  Skilled nursing  PT  OT  Home health aid

## 2017-02-04 ENCOUNTER — Other Ambulatory Visit: Payer: Self-pay | Admitting: Internal Medicine

## 2017-02-04 NOTE — Telephone Encounter (Signed)
Verbals given  

## 2017-02-05 DIAGNOSIS — R69 Illness, unspecified: Secondary | ICD-10-CM | POA: Diagnosis not present

## 2017-02-05 DIAGNOSIS — I480 Paroxysmal atrial fibrillation: Secondary | ICD-10-CM | POA: Diagnosis not present

## 2017-02-05 DIAGNOSIS — M48061 Spinal stenosis, lumbar region without neurogenic claudication: Secondary | ICD-10-CM | POA: Diagnosis not present

## 2017-02-05 DIAGNOSIS — R1310 Dysphagia, unspecified: Secondary | ICD-10-CM | POA: Diagnosis not present

## 2017-02-05 DIAGNOSIS — I2510 Atherosclerotic heart disease of native coronary artery without angina pectoris: Secondary | ICD-10-CM | POA: Diagnosis not present

## 2017-02-05 DIAGNOSIS — I110 Hypertensive heart disease with heart failure: Secondary | ICD-10-CM | POA: Diagnosis not present

## 2017-02-05 DIAGNOSIS — M8008XD Age-related osteoporosis with current pathological fracture, vertebra(e), subsequent encounter for fracture with routine healing: Secondary | ICD-10-CM | POA: Diagnosis not present

## 2017-02-05 DIAGNOSIS — I509 Heart failure, unspecified: Secondary | ICD-10-CM | POA: Diagnosis not present

## 2017-02-06 ENCOUNTER — Telehealth: Payer: Self-pay | Admitting: Physician Assistant

## 2017-02-06 DIAGNOSIS — I2510 Atherosclerotic heart disease of native coronary artery without angina pectoris: Secondary | ICD-10-CM | POA: Diagnosis not present

## 2017-02-06 DIAGNOSIS — M8008XD Age-related osteoporosis with current pathological fracture, vertebra(e), subsequent encounter for fracture with routine healing: Secondary | ICD-10-CM | POA: Diagnosis not present

## 2017-02-06 DIAGNOSIS — I110 Hypertensive heart disease with heart failure: Secondary | ICD-10-CM | POA: Diagnosis not present

## 2017-02-06 DIAGNOSIS — M48061 Spinal stenosis, lumbar region without neurogenic claudication: Secondary | ICD-10-CM | POA: Diagnosis not present

## 2017-02-06 DIAGNOSIS — R69 Illness, unspecified: Secondary | ICD-10-CM | POA: Diagnosis not present

## 2017-02-06 DIAGNOSIS — R1310 Dysphagia, unspecified: Secondary | ICD-10-CM | POA: Diagnosis not present

## 2017-02-06 DIAGNOSIS — I509 Heart failure, unspecified: Secondary | ICD-10-CM | POA: Diagnosis not present

## 2017-02-06 DIAGNOSIS — I480 Paroxysmal atrial fibrillation: Secondary | ICD-10-CM | POA: Diagnosis not present

## 2017-02-06 NOTE — Telephone Encounter (Signed)
Received page from [facility] with INR of 5.2 today. Patient takes Coumadin 2mg  qd except 4mg  on MWF. Advised to hold coumadin today and tomorrow. Take 2mg  Sunday and recheck INR Monday.

## 2017-02-09 ENCOUNTER — Ambulatory Visit (INDEPENDENT_AMBULATORY_CARE_PROVIDER_SITE_OTHER): Payer: Self-pay

## 2017-02-09 DIAGNOSIS — Z5181 Encounter for therapeutic drug level monitoring: Secondary | ICD-10-CM

## 2017-02-09 DIAGNOSIS — Z7901 Long term (current) use of anticoagulants: Secondary | ICD-10-CM

## 2017-02-09 DIAGNOSIS — I2510 Atherosclerotic heart disease of native coronary artery without angina pectoris: Secondary | ICD-10-CM | POA: Diagnosis not present

## 2017-02-09 DIAGNOSIS — M8008XD Age-related osteoporosis with current pathological fracture, vertebra(e), subsequent encounter for fracture with routine healing: Secondary | ICD-10-CM | POA: Diagnosis not present

## 2017-02-09 DIAGNOSIS — R1310 Dysphagia, unspecified: Secondary | ICD-10-CM | POA: Diagnosis not present

## 2017-02-09 DIAGNOSIS — I509 Heart failure, unspecified: Secondary | ICD-10-CM | POA: Diagnosis not present

## 2017-02-09 DIAGNOSIS — M48061 Spinal stenosis, lumbar region without neurogenic claudication: Secondary | ICD-10-CM | POA: Diagnosis not present

## 2017-02-09 DIAGNOSIS — I480 Paroxysmal atrial fibrillation: Secondary | ICD-10-CM | POA: Diagnosis not present

## 2017-02-09 DIAGNOSIS — I4891 Unspecified atrial fibrillation: Secondary | ICD-10-CM

## 2017-02-09 DIAGNOSIS — R69 Illness, unspecified: Secondary | ICD-10-CM | POA: Diagnosis not present

## 2017-02-09 DIAGNOSIS — I110 Hypertensive heart disease with heart failure: Secondary | ICD-10-CM | POA: Diagnosis not present

## 2017-02-09 LAB — PROTIME-INR: INR: 3.7 — AB (ref 0.9–1.1)

## 2017-02-10 DIAGNOSIS — R1310 Dysphagia, unspecified: Secondary | ICD-10-CM | POA: Diagnosis not present

## 2017-02-10 DIAGNOSIS — I110 Hypertensive heart disease with heart failure: Secondary | ICD-10-CM | POA: Diagnosis not present

## 2017-02-10 DIAGNOSIS — R69 Illness, unspecified: Secondary | ICD-10-CM | POA: Diagnosis not present

## 2017-02-10 DIAGNOSIS — I480 Paroxysmal atrial fibrillation: Secondary | ICD-10-CM | POA: Diagnosis not present

## 2017-02-10 DIAGNOSIS — I2510 Atherosclerotic heart disease of native coronary artery without angina pectoris: Secondary | ICD-10-CM | POA: Diagnosis not present

## 2017-02-10 DIAGNOSIS — I509 Heart failure, unspecified: Secondary | ICD-10-CM | POA: Diagnosis not present

## 2017-02-10 DIAGNOSIS — M48061 Spinal stenosis, lumbar region without neurogenic claudication: Secondary | ICD-10-CM | POA: Diagnosis not present

## 2017-02-10 DIAGNOSIS — M8008XD Age-related osteoporosis with current pathological fracture, vertebra(e), subsequent encounter for fracture with routine healing: Secondary | ICD-10-CM | POA: Diagnosis not present

## 2017-02-11 DIAGNOSIS — M48061 Spinal stenosis, lumbar region without neurogenic claudication: Secondary | ICD-10-CM | POA: Diagnosis not present

## 2017-02-11 DIAGNOSIS — M8008XD Age-related osteoporosis with current pathological fracture, vertebra(e), subsequent encounter for fracture with routine healing: Secondary | ICD-10-CM | POA: Diagnosis not present

## 2017-02-11 DIAGNOSIS — I480 Paroxysmal atrial fibrillation: Secondary | ICD-10-CM | POA: Diagnosis not present

## 2017-02-11 DIAGNOSIS — R1310 Dysphagia, unspecified: Secondary | ICD-10-CM | POA: Diagnosis not present

## 2017-02-11 DIAGNOSIS — I509 Heart failure, unspecified: Secondary | ICD-10-CM | POA: Diagnosis not present

## 2017-02-11 DIAGNOSIS — R69 Illness, unspecified: Secondary | ICD-10-CM | POA: Diagnosis not present

## 2017-02-11 DIAGNOSIS — I110 Hypertensive heart disease with heart failure: Secondary | ICD-10-CM | POA: Diagnosis not present

## 2017-02-11 DIAGNOSIS — I2510 Atherosclerotic heart disease of native coronary artery without angina pectoris: Secondary | ICD-10-CM | POA: Diagnosis not present

## 2017-02-12 DIAGNOSIS — M48061 Spinal stenosis, lumbar region without neurogenic claudication: Secondary | ICD-10-CM | POA: Diagnosis not present

## 2017-02-12 DIAGNOSIS — I2510 Atherosclerotic heart disease of native coronary artery without angina pectoris: Secondary | ICD-10-CM | POA: Diagnosis not present

## 2017-02-12 DIAGNOSIS — I509 Heart failure, unspecified: Secondary | ICD-10-CM | POA: Diagnosis not present

## 2017-02-12 DIAGNOSIS — R1310 Dysphagia, unspecified: Secondary | ICD-10-CM | POA: Diagnosis not present

## 2017-02-12 DIAGNOSIS — R69 Illness, unspecified: Secondary | ICD-10-CM | POA: Diagnosis not present

## 2017-02-12 DIAGNOSIS — I480 Paroxysmal atrial fibrillation: Secondary | ICD-10-CM | POA: Diagnosis not present

## 2017-02-12 DIAGNOSIS — M8008XD Age-related osteoporosis with current pathological fracture, vertebra(e), subsequent encounter for fracture with routine healing: Secondary | ICD-10-CM | POA: Diagnosis not present

## 2017-02-12 DIAGNOSIS — I110 Hypertensive heart disease with heart failure: Secondary | ICD-10-CM | POA: Diagnosis not present

## 2017-02-13 DIAGNOSIS — R69 Illness, unspecified: Secondary | ICD-10-CM | POA: Diagnosis not present

## 2017-02-13 DIAGNOSIS — I2510 Atherosclerotic heart disease of native coronary artery without angina pectoris: Secondary | ICD-10-CM | POA: Diagnosis not present

## 2017-02-13 DIAGNOSIS — R1310 Dysphagia, unspecified: Secondary | ICD-10-CM | POA: Diagnosis not present

## 2017-02-13 DIAGNOSIS — I110 Hypertensive heart disease with heart failure: Secondary | ICD-10-CM | POA: Diagnosis not present

## 2017-02-13 DIAGNOSIS — M48061 Spinal stenosis, lumbar region without neurogenic claudication: Secondary | ICD-10-CM | POA: Diagnosis not present

## 2017-02-13 DIAGNOSIS — I480 Paroxysmal atrial fibrillation: Secondary | ICD-10-CM | POA: Diagnosis not present

## 2017-02-13 DIAGNOSIS — I509 Heart failure, unspecified: Secondary | ICD-10-CM | POA: Diagnosis not present

## 2017-02-13 DIAGNOSIS — M8008XD Age-related osteoporosis with current pathological fracture, vertebra(e), subsequent encounter for fracture with routine healing: Secondary | ICD-10-CM | POA: Diagnosis not present

## 2017-02-14 ENCOUNTER — Other Ambulatory Visit: Payer: Self-pay | Admitting: Nurse Practitioner

## 2017-02-16 ENCOUNTER — Ambulatory Visit (INDEPENDENT_AMBULATORY_CARE_PROVIDER_SITE_OTHER): Payer: Medicare HMO | Admitting: Cardiovascular Disease

## 2017-02-16 DIAGNOSIS — I509 Heart failure, unspecified: Secondary | ICD-10-CM | POA: Diagnosis not present

## 2017-02-16 DIAGNOSIS — I2510 Atherosclerotic heart disease of native coronary artery without angina pectoris: Secondary | ICD-10-CM | POA: Diagnosis not present

## 2017-02-16 DIAGNOSIS — I480 Paroxysmal atrial fibrillation: Secondary | ICD-10-CM | POA: Diagnosis not present

## 2017-02-16 DIAGNOSIS — Z5181 Encounter for therapeutic drug level monitoring: Secondary | ICD-10-CM

## 2017-02-16 DIAGNOSIS — Z7901 Long term (current) use of anticoagulants: Secondary | ICD-10-CM | POA: Diagnosis not present

## 2017-02-16 DIAGNOSIS — R69 Illness, unspecified: Secondary | ICD-10-CM | POA: Diagnosis not present

## 2017-02-16 DIAGNOSIS — M8008XD Age-related osteoporosis with current pathological fracture, vertebra(e), subsequent encounter for fracture with routine healing: Secondary | ICD-10-CM | POA: Diagnosis not present

## 2017-02-16 DIAGNOSIS — I110 Hypertensive heart disease with heart failure: Secondary | ICD-10-CM | POA: Diagnosis not present

## 2017-02-16 DIAGNOSIS — M48061 Spinal stenosis, lumbar region without neurogenic claudication: Secondary | ICD-10-CM | POA: Diagnosis not present

## 2017-02-16 DIAGNOSIS — I4891 Unspecified atrial fibrillation: Secondary | ICD-10-CM

## 2017-02-16 DIAGNOSIS — R1310 Dysphagia, unspecified: Secondary | ICD-10-CM | POA: Diagnosis not present

## 2017-02-16 LAB — PROTIME-INR: INR: 4.6 — AB (ref 0.9–1.1)

## 2017-02-17 DIAGNOSIS — M8008XD Age-related osteoporosis with current pathological fracture, vertebra(e), subsequent encounter for fracture with routine healing: Secondary | ICD-10-CM | POA: Diagnosis not present

## 2017-02-17 DIAGNOSIS — I110 Hypertensive heart disease with heart failure: Secondary | ICD-10-CM | POA: Diagnosis not present

## 2017-02-17 DIAGNOSIS — I480 Paroxysmal atrial fibrillation: Secondary | ICD-10-CM | POA: Diagnosis not present

## 2017-02-17 DIAGNOSIS — R69 Illness, unspecified: Secondary | ICD-10-CM | POA: Diagnosis not present

## 2017-02-17 DIAGNOSIS — R1310 Dysphagia, unspecified: Secondary | ICD-10-CM | POA: Diagnosis not present

## 2017-02-17 DIAGNOSIS — M48061 Spinal stenosis, lumbar region without neurogenic claudication: Secondary | ICD-10-CM | POA: Diagnosis not present

## 2017-02-17 DIAGNOSIS — I2510 Atherosclerotic heart disease of native coronary artery without angina pectoris: Secondary | ICD-10-CM | POA: Diagnosis not present

## 2017-02-17 DIAGNOSIS — I509 Heart failure, unspecified: Secondary | ICD-10-CM | POA: Diagnosis not present

## 2017-02-19 DIAGNOSIS — I2510 Atherosclerotic heart disease of native coronary artery without angina pectoris: Secondary | ICD-10-CM | POA: Diagnosis not present

## 2017-02-19 DIAGNOSIS — R1310 Dysphagia, unspecified: Secondary | ICD-10-CM | POA: Diagnosis not present

## 2017-02-19 DIAGNOSIS — M48061 Spinal stenosis, lumbar region without neurogenic claudication: Secondary | ICD-10-CM | POA: Diagnosis not present

## 2017-02-19 DIAGNOSIS — I509 Heart failure, unspecified: Secondary | ICD-10-CM | POA: Diagnosis not present

## 2017-02-19 DIAGNOSIS — M8008XD Age-related osteoporosis with current pathological fracture, vertebra(e), subsequent encounter for fracture with routine healing: Secondary | ICD-10-CM | POA: Diagnosis not present

## 2017-02-19 DIAGNOSIS — I480 Paroxysmal atrial fibrillation: Secondary | ICD-10-CM | POA: Diagnosis not present

## 2017-02-19 DIAGNOSIS — I110 Hypertensive heart disease with heart failure: Secondary | ICD-10-CM | POA: Diagnosis not present

## 2017-02-19 DIAGNOSIS — R69 Illness, unspecified: Secondary | ICD-10-CM | POA: Diagnosis not present

## 2017-02-20 DIAGNOSIS — I2510 Atherosclerotic heart disease of native coronary artery without angina pectoris: Secondary | ICD-10-CM | POA: Diagnosis not present

## 2017-02-20 DIAGNOSIS — R69 Illness, unspecified: Secondary | ICD-10-CM | POA: Diagnosis not present

## 2017-02-20 DIAGNOSIS — R1310 Dysphagia, unspecified: Secondary | ICD-10-CM | POA: Diagnosis not present

## 2017-02-20 DIAGNOSIS — M8008XD Age-related osteoporosis with current pathological fracture, vertebra(e), subsequent encounter for fracture with routine healing: Secondary | ICD-10-CM | POA: Diagnosis not present

## 2017-02-20 DIAGNOSIS — I480 Paroxysmal atrial fibrillation: Secondary | ICD-10-CM | POA: Diagnosis not present

## 2017-02-20 DIAGNOSIS — I509 Heart failure, unspecified: Secondary | ICD-10-CM | POA: Diagnosis not present

## 2017-02-20 DIAGNOSIS — M48061 Spinal stenosis, lumbar region without neurogenic claudication: Secondary | ICD-10-CM | POA: Diagnosis not present

## 2017-02-20 DIAGNOSIS — I110 Hypertensive heart disease with heart failure: Secondary | ICD-10-CM | POA: Diagnosis not present

## 2017-02-23 ENCOUNTER — Ambulatory Visit (INDEPENDENT_AMBULATORY_CARE_PROVIDER_SITE_OTHER): Payer: Medicare HMO | Admitting: Cardiovascular Disease

## 2017-02-23 DIAGNOSIS — Z7901 Long term (current) use of anticoagulants: Secondary | ICD-10-CM | POA: Diagnosis not present

## 2017-02-23 DIAGNOSIS — F419 Anxiety disorder, unspecified: Secondary | ICD-10-CM | POA: Diagnosis not present

## 2017-02-23 DIAGNOSIS — M8008XD Age-related osteoporosis with current pathological fracture, vertebra(e), subsequent encounter for fracture with routine healing: Secondary | ICD-10-CM | POA: Diagnosis not present

## 2017-02-23 DIAGNOSIS — M48061 Spinal stenosis, lumbar region without neurogenic claudication: Secondary | ICD-10-CM | POA: Diagnosis not present

## 2017-02-23 DIAGNOSIS — F329 Major depressive disorder, single episode, unspecified: Secondary | ICD-10-CM | POA: Diagnosis not present

## 2017-02-23 DIAGNOSIS — I2510 Atherosclerotic heart disease of native coronary artery without angina pectoris: Secondary | ICD-10-CM | POA: Diagnosis not present

## 2017-02-23 DIAGNOSIS — I480 Paroxysmal atrial fibrillation: Secondary | ICD-10-CM | POA: Diagnosis not present

## 2017-02-23 DIAGNOSIS — Z5181 Encounter for therapeutic drug level monitoring: Secondary | ICD-10-CM

## 2017-02-23 DIAGNOSIS — I4891 Unspecified atrial fibrillation: Secondary | ICD-10-CM

## 2017-02-23 DIAGNOSIS — F0390 Unspecified dementia without behavioral disturbance: Secondary | ICD-10-CM

## 2017-02-23 DIAGNOSIS — I110 Hypertensive heart disease with heart failure: Secondary | ICD-10-CM | POA: Diagnosis not present

## 2017-02-23 DIAGNOSIS — Z85038 Personal history of other malignant neoplasm of large intestine: Secondary | ICD-10-CM

## 2017-02-23 DIAGNOSIS — Z7982 Long term (current) use of aspirin: Secondary | ICD-10-CM

## 2017-02-23 DIAGNOSIS — R69 Illness, unspecified: Secondary | ICD-10-CM | POA: Diagnosis not present

## 2017-02-23 DIAGNOSIS — M1991 Primary osteoarthritis, unspecified site: Secondary | ICD-10-CM | POA: Diagnosis not present

## 2017-02-23 DIAGNOSIS — I509 Heart failure, unspecified: Secondary | ICD-10-CM | POA: Diagnosis not present

## 2017-02-23 DIAGNOSIS — R1310 Dysphagia, unspecified: Secondary | ICD-10-CM | POA: Diagnosis not present

## 2017-02-23 LAB — PROTIME-INR: INR: 1.8 — AB (ref 0.9–1.1)

## 2017-02-24 ENCOUNTER — Ambulatory Visit (INDEPENDENT_AMBULATORY_CARE_PROVIDER_SITE_OTHER): Payer: Medicare HMO | Admitting: Internal Medicine

## 2017-02-24 ENCOUNTER — Encounter: Payer: Self-pay | Admitting: Internal Medicine

## 2017-02-24 DIAGNOSIS — G301 Alzheimer's disease with late onset: Secondary | ICD-10-CM | POA: Diagnosis not present

## 2017-02-24 DIAGNOSIS — F0281 Dementia in other diseases classified elsewhere with behavioral disturbance: Secondary | ICD-10-CM | POA: Diagnosis not present

## 2017-02-24 DIAGNOSIS — R69 Illness, unspecified: Secondary | ICD-10-CM | POA: Diagnosis not present

## 2017-02-24 DIAGNOSIS — R531 Weakness: Secondary | ICD-10-CM

## 2017-02-24 MED ORDER — OLANZAPINE-FLUOXETINE HCL 3-25 MG PO CAPS: 1.0000 | ORAL_CAPSULE | Freq: Every evening | ORAL | 5 refills | Status: DC

## 2017-02-24 NOTE — Progress Notes (Signed)
Pre visit review using our clinic review tool, if applicable. No additional management support is needed unless otherwise documented below in the visit note. 

## 2017-02-24 NOTE — Patient Instructions (Addendum)
Stop Zoloft Start Symbyax at after dinner or at bedtime. Can start with 1/2 tab Stop Depakote if too sedated

## 2017-02-24 NOTE — Assessment & Plan Note (Signed)
In a w/c [facility]

## 2017-02-24 NOTE — Progress Notes (Signed)
Subjective:  Patient ID: Samuel Santiago, male    DOB: [birth year]  Age: 83 y.o. MRN: [id number]  CC: No chief complaint on file.   HPI Corday Wyka PIRJJOA presents for dementia, LBP, gait disorder. Family is c/o visual hallucinations, nightmares, depression. Not sleeping at all...  Outpatient Medications Prior to Visit  Medication Sig Dispense Refill  . acetaminophen (TYLENOL) 500 MG tablet Take 1,000 mg by mouth every 6 (six) hours as needed (pain).     Marland Kitchen aspirin 81 MG chewable tablet Chew 1 tablet (81 mg total) by mouth daily.    Marland Kitchen atorvastatin (LIPITOR) 10 MG tablet TAKE 1 TABLET BY MOUTH ONCE DAILY 90 tablet 3  . calcitonin, salmon, (MIACALCIN/FORTICAL) 200 UNIT/ACT nasal spray Place 1 spray into alternate nostrils daily.    . cholecalciferol (VITAMIN D) 1000 units tablet Take 5,000 Units by mouth daily.    . diazepam (VALIUM) 5 MG tablet Take 5 mg by mouth every 8 (eight) hours as needed for muscle spasms.    . divalproex (DEPAKOTE ER) 250 MG 24 hr tablet 1 qam for 1 week then 2  qam (Patient taking differently: Take 500 mg by mouth daily with breakfast. ) 60 tablet 5  . donepezil (ARICEPT) 5 MG tablet Take 1 tablet (5 mg total) by mouth at bedtime. 30 tablet 11  . erythromycin ophthalmic ointment Place 1 application into both eyes daily as needed (infection).    Marland Kitchen GLUCOSAMINE-CHONDROITIN PO Take 1 tablet by mouth daily.    . hydrochlorothiazide (HYDRODIURIL) 25 MG tablet TAKE 1 TABLET BY MOUTH ONCE DAILY 90 tablet 3  . HYDROcodone-acetaminophen (NORCO/VICODIN) 5-325 MG tablet Take 1 tablet by mouth every 6 (six) hours as needed for moderate pain. 42 tablet 0  . losartan (COZAAR) 100 MG tablet TAKE 1 TABLET BY MOUTH DAILY 90 tablet 3  . metoprolol tartrate (LOPRESSOR) 25 MG tablet TAKE ONE TABLET BY MOUTH TWICE DAILY 180 tablet 2  . Multiple Vitamin (MULTIVITAMIN WITH MINERALS) TABS tablet Take 1 tablet by mouth daily.    . Omega-3 Fatty Acids (FISH OIL PO) Take 1 capsule by mouth  daily.    Marland Kitchen omeprazole (PRILOSEC) 20 MG capsule TAKE 1 CAPSULE BY MOUTH DAILY 90 capsule 1  . ondansetron (ZOFRAN) 4 MG tablet Take 4 mg by mouth every 6 (six) hours as needed for nausea or vomiting.    . polyethylene glycol (MIRALAX / GLYCOLAX) packet Take 17 g by mouth daily. Hold for diarrhea    . polyvinyl alcohol-povidone (REFRESH) 1.4-0.6 % ophthalmic solution Place 1 drop into both eyes 4 (four) times daily as needed (dry eyes).     Marland Kitchen senna-docusate (SENOKOT S) 8.6-50 MG tablet Take 2 tablets by mouth at bedtime as needed for mild constipation. For constipation. Available over-the-counter. (Patient taking differently: Take 1 tablet by mouth daily. For constipation. Available over-the-counter.) 30 tablet 3  . sertraline (ZOLOFT) 100 MG tablet Half a pill qam  For 6 days then 1  qam (Patient taking differently: Take 100 mg by mouth daily with breakfast. ) 30 tablet 5  . warfarin (COUMADIN) 1 MG tablet Take 2 mg by mouth at bedtime.    Marland Kitchen omeprazole (PRILOSEC) 40 MG capsule Take 40 mg by mouth at bedtime.    . ondansetron (ZOFRAN) 4 MG tablet Take 1 tablet (4 mg total) by mouth every 8 (eight) hours as needed for nausea or vomiting. 20 tablet 0   No facility-administered medications prior to visit.     ROS Review  of Systems  Constitutional: Positive for fatigue. Negative for appetite change and unexpected weight change.  HENT: Negative for congestion, nosebleeds, sneezing, sore throat and trouble swallowing.   Eyes: Negative for itching and visual disturbance.  Respiratory: Negative for cough.   Cardiovascular: Negative for chest pain, palpitations and leg swelling.  Gastrointestinal: Negative for abdominal distention, blood in stool, diarrhea and nausea.  Genitourinary: Negative for frequency and hematuria.  Musculoskeletal: Positive for back pain and gait problem. Negative for joint swelling and neck pain.  Skin: Negative for rash.  Neurological: Negative for dizziness, tremors, speech  difficulty and weakness.  Psychiatric/Behavioral: Positive for behavioral problems, confusion, decreased concentration, dysphoric mood and sleep disturbance. Negative for agitation and suicidal ideas. The patient is nervous/anxious.     Objective:  BP 130/82 (BP Location: Left Arm, Patient Position: Sitting, Cuff Size: Normal)   Pulse (!) 52   Temp 98 F (36.7 C) (Oral)   Ht 5\' 4"  (1.626 m)   Wt 159 lb (72.1 kg)   SpO2 98%   BMI 27.29 kg/m   BP Readings from Last 3 Encounters:  02/24/17 130/82  01/25/17 (!) 148/72  01/15/17 (!) 142/56    Wt Readings from Last 3 Encounters:  02/24/17 159 lb (72.1 kg)  01/22/17 159 lb (72.1 kg)  01/15/17 159 lb (72.1 kg)    Physical Exam  Constitutional: He is oriented to person, place, and time. He appears well-developed. No distress.  NAD  HENT:  Mouth/Throat: Oropharynx is clear and moist.  Eyes: Conjunctivae are normal. Pupils are equal, round, and reactive to light.  Neck: Normal range of motion. No JVD present. No thyromegaly present.  Cardiovascular: Normal rate, regular rhythm, normal heart sounds and intact distal pulses.  Exam reveals no gallop and no friction rub.   No murmur heard. Pulmonary/Chest: Effort normal and breath sounds normal. No respiratory distress. He has no wheezes. He has no rales. He exhibits no tenderness.  Abdominal: Soft. Bowel sounds are normal. He exhibits no distension and no mass. There is no tenderness. There is no rebound and no guarding.  Musculoskeletal: Normal range of motion. He exhibits no edema or tenderness.  Lymphadenopathy:    He has no cervical adenopathy.  Neurological: He is alert and oriented to person, place, and time. He has normal reflexes. No cranial nerve deficit. He exhibits normal muscle tone. He displays a negative Romberg sign. Coordination and gait normal.  Skin: Skin is warm and dry. No rash noted.  in a w/c Confused Flat affect  Lab Results  Component Value Date   WBC 11.5  (H) 01/15/2017   HGB 14.1 01/15/2017   HCT 40.9 01/15/2017   PLT 148 (L) 01/15/2017   GLUCOSE 85 01/24/2017   CHOL 148 05/08/2016   TRIG 121 05/08/2016   HDL 39 (L) 05/08/2016   LDLCALC 85 05/08/2016   ALT 33 10/09/2016   AST 28 10/09/2016   NA 135 01/24/2017   K 4.2 01/24/2017   CL 102 01/24/2017   CREATININE 0.80 01/24/2017   BUN 10 01/24/2017   CO2 25 01/24/2017   TSH 1.11 10/09/2016   PSA 1.12 01/27/2013   INR 1.8 (A) 02/23/2017   HGBA1C 5.8 (H) 11/19/2015    No results found.  Assessment & Plan:   There are no diagnoses linked to this encounter. I am having Mr. Wik maintain his multivitamin with minerals, Omega-3 Fatty Acids (FISH OIL PO), polyvinyl alcohol-povidone, GLUCOSAMINE-CHONDROITIN PO, acetaminophen, aspirin, senna-docusate, metoprolol tartrate, donepezil, losartan, sertraline, divalproex, hydrochlorothiazide, cholecalciferol,  polyethylene glycol, calcitonin (salmon), ondansetron, diazepam, erythromycin, warfarin, HYDROcodone-acetaminophen, omeprazole, and atorvastatin.  No orders of the defined types were placed in this encounter.    Follow-up: No Follow-up on file.  Walker Kehr, MD

## 2017-02-24 NOTE — Assessment & Plan Note (Signed)
Worse Stop Zoloft Start Symbyax at after dinner or at bedtime. Can start with 1/2 tab Stop Depakote if too sedated  Potential benefits of a long term Symbyax use as well as potential risks  and complications were explained to the patient and were aknowledged.

## 2017-02-27 DIAGNOSIS — I480 Paroxysmal atrial fibrillation: Secondary | ICD-10-CM | POA: Diagnosis not present

## 2017-02-27 DIAGNOSIS — I2510 Atherosclerotic heart disease of native coronary artery without angina pectoris: Secondary | ICD-10-CM | POA: Diagnosis not present

## 2017-02-27 DIAGNOSIS — I509 Heart failure, unspecified: Secondary | ICD-10-CM | POA: Diagnosis not present

## 2017-02-27 DIAGNOSIS — R1310 Dysphagia, unspecified: Secondary | ICD-10-CM | POA: Diagnosis not present

## 2017-02-27 DIAGNOSIS — M48061 Spinal stenosis, lumbar region without neurogenic claudication: Secondary | ICD-10-CM | POA: Diagnosis not present

## 2017-02-27 DIAGNOSIS — R69 Illness, unspecified: Secondary | ICD-10-CM | POA: Diagnosis not present

## 2017-02-27 DIAGNOSIS — M8008XD Age-related osteoporosis with current pathological fracture, vertebra(e), subsequent encounter for fracture with routine healing: Secondary | ICD-10-CM | POA: Diagnosis not present

## 2017-02-27 DIAGNOSIS — I110 Hypertensive heart disease with heart failure: Secondary | ICD-10-CM | POA: Diagnosis not present

## 2017-03-01 DIAGNOSIS — Z5189 Encounter for other specified aftercare: Secondary | ICD-10-CM | POA: Diagnosis not present

## 2017-03-01 DIAGNOSIS — S32020D Wedge compression fracture of second lumbar vertebra, subsequent encounter for fracture with routine healing: Secondary | ICD-10-CM | POA: Diagnosis not present

## 2017-03-01 DIAGNOSIS — R69 Illness, unspecified: Secondary | ICD-10-CM | POA: Diagnosis not present

## 2017-03-01 DIAGNOSIS — I2510 Atherosclerotic heart disease of native coronary artery without angina pectoris: Secondary | ICD-10-CM | POA: Diagnosis not present

## 2017-03-02 DIAGNOSIS — I2510 Atherosclerotic heart disease of native coronary artery without angina pectoris: Secondary | ICD-10-CM | POA: Diagnosis not present

## 2017-03-02 DIAGNOSIS — I480 Paroxysmal atrial fibrillation: Secondary | ICD-10-CM | POA: Diagnosis not present

## 2017-03-02 DIAGNOSIS — R1310 Dysphagia, unspecified: Secondary | ICD-10-CM | POA: Diagnosis not present

## 2017-03-02 DIAGNOSIS — M8008XD Age-related osteoporosis with current pathological fracture, vertebra(e), subsequent encounter for fracture with routine healing: Secondary | ICD-10-CM | POA: Diagnosis not present

## 2017-03-02 DIAGNOSIS — I509 Heart failure, unspecified: Secondary | ICD-10-CM | POA: Diagnosis not present

## 2017-03-02 DIAGNOSIS — I110 Hypertensive heart disease with heart failure: Secondary | ICD-10-CM | POA: Diagnosis not present

## 2017-03-02 DIAGNOSIS — R69 Illness, unspecified: Secondary | ICD-10-CM | POA: Diagnosis not present

## 2017-03-02 DIAGNOSIS — Z7901 Long term (current) use of anticoagulants: Secondary | ICD-10-CM | POA: Diagnosis not present

## 2017-03-02 DIAGNOSIS — M48061 Spinal stenosis, lumbar region without neurogenic claudication: Secondary | ICD-10-CM | POA: Diagnosis not present

## 2017-03-02 DIAGNOSIS — Z5181 Encounter for therapeutic drug level monitoring: Secondary | ICD-10-CM | POA: Diagnosis not present

## 2017-03-02 LAB — PROTIME-INR: INR: 3 — AB (ref 0.9–1.1)

## 2017-03-03 ENCOUNTER — Ambulatory Visit (INDEPENDENT_AMBULATORY_CARE_PROVIDER_SITE_OTHER): Payer: Self-pay | Admitting: Cardiology

## 2017-03-03 DIAGNOSIS — Z5181 Encounter for therapeutic drug level monitoring: Secondary | ICD-10-CM

## 2017-03-03 DIAGNOSIS — Z7901 Long term (current) use of anticoagulants: Secondary | ICD-10-CM

## 2017-03-03 DIAGNOSIS — I4891 Unspecified atrial fibrillation: Secondary | ICD-10-CM

## 2017-03-04 DIAGNOSIS — I110 Hypertensive heart disease with heart failure: Secondary | ICD-10-CM | POA: Diagnosis not present

## 2017-03-04 DIAGNOSIS — R1310 Dysphagia, unspecified: Secondary | ICD-10-CM | POA: Diagnosis not present

## 2017-03-04 DIAGNOSIS — I2510 Atherosclerotic heart disease of native coronary artery without angina pectoris: Secondary | ICD-10-CM | POA: Diagnosis not present

## 2017-03-04 DIAGNOSIS — I509 Heart failure, unspecified: Secondary | ICD-10-CM | POA: Diagnosis not present

## 2017-03-04 DIAGNOSIS — M48061 Spinal stenosis, lumbar region without neurogenic claudication: Secondary | ICD-10-CM | POA: Diagnosis not present

## 2017-03-04 DIAGNOSIS — M8008XD Age-related osteoporosis with current pathological fracture, vertebra(e), subsequent encounter for fracture with routine healing: Secondary | ICD-10-CM | POA: Diagnosis not present

## 2017-03-04 DIAGNOSIS — R69 Illness, unspecified: Secondary | ICD-10-CM | POA: Diagnosis not present

## 2017-03-04 DIAGNOSIS — I480 Paroxysmal atrial fibrillation: Secondary | ICD-10-CM | POA: Diagnosis not present

## 2017-03-06 DIAGNOSIS — I480 Paroxysmal atrial fibrillation: Secondary | ICD-10-CM | POA: Diagnosis not present

## 2017-03-06 DIAGNOSIS — M48061 Spinal stenosis, lumbar region without neurogenic claudication: Secondary | ICD-10-CM | POA: Diagnosis not present

## 2017-03-06 DIAGNOSIS — I110 Hypertensive heart disease with heart failure: Secondary | ICD-10-CM | POA: Diagnosis not present

## 2017-03-06 DIAGNOSIS — M8008XD Age-related osteoporosis with current pathological fracture, vertebra(e), subsequent encounter for fracture with routine healing: Secondary | ICD-10-CM | POA: Diagnosis not present

## 2017-03-06 DIAGNOSIS — I509 Heart failure, unspecified: Secondary | ICD-10-CM | POA: Diagnosis not present

## 2017-03-06 DIAGNOSIS — I2510 Atherosclerotic heart disease of native coronary artery without angina pectoris: Secondary | ICD-10-CM | POA: Diagnosis not present

## 2017-03-06 DIAGNOSIS — R69 Illness, unspecified: Secondary | ICD-10-CM | POA: Diagnosis not present

## 2017-03-06 DIAGNOSIS — R1310 Dysphagia, unspecified: Secondary | ICD-10-CM | POA: Diagnosis not present

## 2017-03-09 DIAGNOSIS — R69 Illness, unspecified: Secondary | ICD-10-CM | POA: Diagnosis not present

## 2017-03-09 DIAGNOSIS — M48061 Spinal stenosis, lumbar region without neurogenic claudication: Secondary | ICD-10-CM | POA: Diagnosis not present

## 2017-03-09 DIAGNOSIS — I509 Heart failure, unspecified: Secondary | ICD-10-CM | POA: Diagnosis not present

## 2017-03-09 DIAGNOSIS — I2510 Atherosclerotic heart disease of native coronary artery without angina pectoris: Secondary | ICD-10-CM | POA: Diagnosis not present

## 2017-03-09 DIAGNOSIS — R1310 Dysphagia, unspecified: Secondary | ICD-10-CM | POA: Diagnosis not present

## 2017-03-09 DIAGNOSIS — M8008XD Age-related osteoporosis with current pathological fracture, vertebra(e), subsequent encounter for fracture with routine healing: Secondary | ICD-10-CM | POA: Diagnosis not present

## 2017-03-09 DIAGNOSIS — I480 Paroxysmal atrial fibrillation: Secondary | ICD-10-CM | POA: Diagnosis not present

## 2017-03-09 DIAGNOSIS — I110 Hypertensive heart disease with heart failure: Secondary | ICD-10-CM | POA: Diagnosis not present

## 2017-03-11 DIAGNOSIS — R1310 Dysphagia, unspecified: Secondary | ICD-10-CM | POA: Diagnosis not present

## 2017-03-11 DIAGNOSIS — I110 Hypertensive heart disease with heart failure: Secondary | ICD-10-CM | POA: Diagnosis not present

## 2017-03-11 DIAGNOSIS — I2510 Atherosclerotic heart disease of native coronary artery without angina pectoris: Secondary | ICD-10-CM | POA: Diagnosis not present

## 2017-03-11 DIAGNOSIS — M8008XD Age-related osteoporosis with current pathological fracture, vertebra(e), subsequent encounter for fracture with routine healing: Secondary | ICD-10-CM | POA: Diagnosis not present

## 2017-03-11 DIAGNOSIS — I509 Heart failure, unspecified: Secondary | ICD-10-CM | POA: Diagnosis not present

## 2017-03-11 DIAGNOSIS — I480 Paroxysmal atrial fibrillation: Secondary | ICD-10-CM | POA: Diagnosis not present

## 2017-03-11 DIAGNOSIS — R69 Illness, unspecified: Secondary | ICD-10-CM | POA: Diagnosis not present

## 2017-03-11 DIAGNOSIS — M48061 Spinal stenosis, lumbar region without neurogenic claudication: Secondary | ICD-10-CM | POA: Diagnosis not present

## 2017-03-16 DIAGNOSIS — R1310 Dysphagia, unspecified: Secondary | ICD-10-CM | POA: Diagnosis not present

## 2017-03-16 DIAGNOSIS — M48061 Spinal stenosis, lumbar region without neurogenic claudication: Secondary | ICD-10-CM | POA: Diagnosis not present

## 2017-03-16 DIAGNOSIS — I110 Hypertensive heart disease with heart failure: Secondary | ICD-10-CM | POA: Diagnosis not present

## 2017-03-16 DIAGNOSIS — I480 Paroxysmal atrial fibrillation: Secondary | ICD-10-CM | POA: Diagnosis not present

## 2017-03-16 DIAGNOSIS — I509 Heart failure, unspecified: Secondary | ICD-10-CM | POA: Diagnosis not present

## 2017-03-16 DIAGNOSIS — M8008XD Age-related osteoporosis with current pathological fracture, vertebra(e), subsequent encounter for fracture with routine healing: Secondary | ICD-10-CM | POA: Diagnosis not present

## 2017-03-16 DIAGNOSIS — I2510 Atherosclerotic heart disease of native coronary artery without angina pectoris: Secondary | ICD-10-CM | POA: Diagnosis not present

## 2017-03-16 DIAGNOSIS — R69 Illness, unspecified: Secondary | ICD-10-CM | POA: Diagnosis not present

## 2017-03-17 ENCOUNTER — Ambulatory Visit (INDEPENDENT_AMBULATORY_CARE_PROVIDER_SITE_OTHER): Payer: Medicare HMO | Admitting: Interventional Cardiology

## 2017-03-17 DIAGNOSIS — R1310 Dysphagia, unspecified: Secondary | ICD-10-CM | POA: Diagnosis not present

## 2017-03-17 DIAGNOSIS — I509 Heart failure, unspecified: Secondary | ICD-10-CM | POA: Diagnosis not present

## 2017-03-17 DIAGNOSIS — I2510 Atherosclerotic heart disease of native coronary artery without angina pectoris: Secondary | ICD-10-CM | POA: Diagnosis not present

## 2017-03-17 DIAGNOSIS — Z7901 Long term (current) use of anticoagulants: Secondary | ICD-10-CM | POA: Diagnosis not present

## 2017-03-17 DIAGNOSIS — I4891 Unspecified atrial fibrillation: Secondary | ICD-10-CM

## 2017-03-17 DIAGNOSIS — I480 Paroxysmal atrial fibrillation: Secondary | ICD-10-CM | POA: Diagnosis not present

## 2017-03-17 DIAGNOSIS — M8008XD Age-related osteoporosis with current pathological fracture, vertebra(e), subsequent encounter for fracture with routine healing: Secondary | ICD-10-CM | POA: Diagnosis not present

## 2017-03-17 DIAGNOSIS — Z5181 Encounter for therapeutic drug level monitoring: Secondary | ICD-10-CM

## 2017-03-17 DIAGNOSIS — M48061 Spinal stenosis, lumbar region without neurogenic claudication: Secondary | ICD-10-CM | POA: Diagnosis not present

## 2017-03-17 DIAGNOSIS — I110 Hypertensive heart disease with heart failure: Secondary | ICD-10-CM | POA: Diagnosis not present

## 2017-03-17 DIAGNOSIS — R69 Illness, unspecified: Secondary | ICD-10-CM | POA: Diagnosis not present

## 2017-03-17 LAB — PROTIME-INR: INR: 2.2 — AB (ref 0.9–1.1)

## 2017-03-18 ENCOUNTER — Encounter: Payer: Self-pay | Admitting: Nurse Practitioner

## 2017-03-18 ENCOUNTER — Telehealth: Payer: Self-pay | Admitting: Pharmacist

## 2017-03-18 DIAGNOSIS — I2510 Atherosclerotic heart disease of native coronary artery without angina pectoris: Secondary | ICD-10-CM | POA: Diagnosis not present

## 2017-03-18 DIAGNOSIS — M48061 Spinal stenosis, lumbar region without neurogenic claudication: Secondary | ICD-10-CM | POA: Diagnosis not present

## 2017-03-18 DIAGNOSIS — I110 Hypertensive heart disease with heart failure: Secondary | ICD-10-CM | POA: Diagnosis not present

## 2017-03-18 DIAGNOSIS — I480 Paroxysmal atrial fibrillation: Secondary | ICD-10-CM | POA: Diagnosis not present

## 2017-03-18 DIAGNOSIS — R69 Illness, unspecified: Secondary | ICD-10-CM | POA: Diagnosis not present

## 2017-03-18 DIAGNOSIS — M8008XD Age-related osteoporosis with current pathological fracture, vertebra(e), subsequent encounter for fracture with routine healing: Secondary | ICD-10-CM | POA: Diagnosis not present

## 2017-03-18 DIAGNOSIS — I509 Heart failure, unspecified: Secondary | ICD-10-CM | POA: Diagnosis not present

## 2017-03-18 DIAGNOSIS — R1310 Dysphagia, unspecified: Secondary | ICD-10-CM | POA: Diagnosis not present

## 2017-03-18 NOTE — Telephone Encounter (Signed)
Pt's wife wrote Truitt Merle, NP requesting home monitor for patient since he is bed-ridden. Spoke with Cecille Rubin who is in agreement that change to [place] would likely be a better option for patient. Eliquis was previously cost-prohibitive for patient. Per wife insurance will pay for 80% of home meter. Cost will likely be close between home meter and DOAC

## 2017-03-19 DIAGNOSIS — R69 Illness, unspecified: Secondary | ICD-10-CM | POA: Diagnosis not present

## 2017-03-19 DIAGNOSIS — M48061 Spinal stenosis, lumbar region without neurogenic claudication: Secondary | ICD-10-CM | POA: Diagnosis not present

## 2017-03-19 DIAGNOSIS — I480 Paroxysmal atrial fibrillation: Secondary | ICD-10-CM | POA: Diagnosis not present

## 2017-03-19 DIAGNOSIS — I509 Heart failure, unspecified: Secondary | ICD-10-CM | POA: Diagnosis not present

## 2017-03-19 DIAGNOSIS — I2510 Atherosclerotic heart disease of native coronary artery without angina pectoris: Secondary | ICD-10-CM | POA: Diagnosis not present

## 2017-03-19 DIAGNOSIS — R1310 Dysphagia, unspecified: Secondary | ICD-10-CM | POA: Diagnosis not present

## 2017-03-19 DIAGNOSIS — I110 Hypertensive heart disease with heart failure: Secondary | ICD-10-CM | POA: Diagnosis not present

## 2017-03-19 DIAGNOSIS — M8008XD Age-related osteoporosis with current pathological fracture, vertebra(e), subsequent encounter for fracture with routine healing: Secondary | ICD-10-CM | POA: Diagnosis not present

## 2017-03-19 NOTE — Telephone Encounter (Signed)
Spoke with pt's wife and discussed options. She would prefer Eliquis. Based on Scr and weight appropriate dose would be Eliquis 5mg  BID. He has an appt on 03/31/17 with Truitt Merle, NP we will plan to transition him to Eliquis at this visit. She states understanding and appreciation for call and discussion on options to better manage patient.

## 2017-03-23 DIAGNOSIS — M48061 Spinal stenosis, lumbar region without neurogenic claudication: Secondary | ICD-10-CM | POA: Diagnosis not present

## 2017-03-23 DIAGNOSIS — I480 Paroxysmal atrial fibrillation: Secondary | ICD-10-CM | POA: Diagnosis not present

## 2017-03-23 DIAGNOSIS — I2510 Atherosclerotic heart disease of native coronary artery without angina pectoris: Secondary | ICD-10-CM | POA: Diagnosis not present

## 2017-03-23 DIAGNOSIS — I509 Heart failure, unspecified: Secondary | ICD-10-CM | POA: Diagnosis not present

## 2017-03-23 DIAGNOSIS — M8008XD Age-related osteoporosis with current pathological fracture, vertebra(e), subsequent encounter for fracture with routine healing: Secondary | ICD-10-CM | POA: Diagnosis not present

## 2017-03-23 DIAGNOSIS — I110 Hypertensive heart disease with heart failure: Secondary | ICD-10-CM | POA: Diagnosis not present

## 2017-03-23 DIAGNOSIS — R69 Illness, unspecified: Secondary | ICD-10-CM | POA: Diagnosis not present

## 2017-03-23 DIAGNOSIS — R1310 Dysphagia, unspecified: Secondary | ICD-10-CM | POA: Diagnosis not present

## 2017-03-24 DIAGNOSIS — I2510 Atherosclerotic heart disease of native coronary artery without angina pectoris: Secondary | ICD-10-CM | POA: Diagnosis not present

## 2017-03-24 DIAGNOSIS — R69 Illness, unspecified: Secondary | ICD-10-CM | POA: Diagnosis not present

## 2017-03-24 DIAGNOSIS — I509 Heart failure, unspecified: Secondary | ICD-10-CM | POA: Diagnosis not present

## 2017-03-24 DIAGNOSIS — I110 Hypertensive heart disease with heart failure: Secondary | ICD-10-CM | POA: Diagnosis not present

## 2017-03-24 DIAGNOSIS — M8008XD Age-related osteoporosis with current pathological fracture, vertebra(e), subsequent encounter for fracture with routine healing: Secondary | ICD-10-CM | POA: Diagnosis not present

## 2017-03-24 DIAGNOSIS — R1310 Dysphagia, unspecified: Secondary | ICD-10-CM | POA: Diagnosis not present

## 2017-03-24 DIAGNOSIS — M48061 Spinal stenosis, lumbar region without neurogenic claudication: Secondary | ICD-10-CM | POA: Diagnosis not present

## 2017-03-24 DIAGNOSIS — I480 Paroxysmal atrial fibrillation: Secondary | ICD-10-CM | POA: Diagnosis not present

## 2017-03-25 DIAGNOSIS — I509 Heart failure, unspecified: Secondary | ICD-10-CM | POA: Diagnosis not present

## 2017-03-25 DIAGNOSIS — I480 Paroxysmal atrial fibrillation: Secondary | ICD-10-CM | POA: Diagnosis not present

## 2017-03-25 DIAGNOSIS — M48061 Spinal stenosis, lumbar region without neurogenic claudication: Secondary | ICD-10-CM | POA: Diagnosis not present

## 2017-03-25 DIAGNOSIS — R69 Illness, unspecified: Secondary | ICD-10-CM | POA: Diagnosis not present

## 2017-03-25 DIAGNOSIS — I2510 Atherosclerotic heart disease of native coronary artery without angina pectoris: Secondary | ICD-10-CM | POA: Diagnosis not present

## 2017-03-25 DIAGNOSIS — M8008XD Age-related osteoporosis with current pathological fracture, vertebra(e), subsequent encounter for fracture with routine healing: Secondary | ICD-10-CM | POA: Diagnosis not present

## 2017-03-25 DIAGNOSIS — I110 Hypertensive heart disease with heart failure: Secondary | ICD-10-CM | POA: Diagnosis not present

## 2017-03-25 DIAGNOSIS — R1310 Dysphagia, unspecified: Secondary | ICD-10-CM | POA: Diagnosis not present

## 2017-03-26 ENCOUNTER — Telehealth: Payer: Self-pay | Admitting: Internal Medicine

## 2017-03-26 NOTE — Telephone Encounter (Signed)
Pts wife called stating that the sleeping medication that was prescribed for her husband has not been helping. She would like to know if something else can be sent in for him to [facility].

## 2017-03-27 DIAGNOSIS — I110 Hypertensive heart disease with heart failure: Secondary | ICD-10-CM | POA: Diagnosis not present

## 2017-03-27 DIAGNOSIS — R69 Illness, unspecified: Secondary | ICD-10-CM | POA: Diagnosis not present

## 2017-03-27 DIAGNOSIS — R1310 Dysphagia, unspecified: Secondary | ICD-10-CM | POA: Diagnosis not present

## 2017-03-27 DIAGNOSIS — M8008XD Age-related osteoporosis with current pathological fracture, vertebra(e), subsequent encounter for fracture with routine healing: Secondary | ICD-10-CM | POA: Diagnosis not present

## 2017-03-27 DIAGNOSIS — I509 Heart failure, unspecified: Secondary | ICD-10-CM | POA: Diagnosis not present

## 2017-03-27 DIAGNOSIS — I2510 Atherosclerotic heart disease of native coronary artery without angina pectoris: Secondary | ICD-10-CM | POA: Diagnosis not present

## 2017-03-27 DIAGNOSIS — I480 Paroxysmal atrial fibrillation: Secondary | ICD-10-CM | POA: Diagnosis not present

## 2017-03-27 DIAGNOSIS — M48061 Spinal stenosis, lumbar region without neurogenic claudication: Secondary | ICD-10-CM | POA: Diagnosis not present

## 2017-03-27 MED ORDER — OLANZAPINE-FLUOXETINE HCL 6-25 MG PO CAPS: 1.0000 | ORAL_CAPSULE | Freq: Every day | ORAL | 5 refills | Status: DC

## 2017-03-27 NOTE — Telephone Encounter (Signed)
Please advise 

## 2017-03-27 NOTE — Telephone Encounter (Signed)
Symbyax next dose was emailed 6-25 - pls start in place of the old dose Thx

## 2017-03-30 DIAGNOSIS — I509 Heart failure, unspecified: Secondary | ICD-10-CM | POA: Diagnosis not present

## 2017-03-30 DIAGNOSIS — I480 Paroxysmal atrial fibrillation: Secondary | ICD-10-CM | POA: Diagnosis not present

## 2017-03-30 DIAGNOSIS — I2510 Atherosclerotic heart disease of native coronary artery without angina pectoris: Secondary | ICD-10-CM | POA: Diagnosis not present

## 2017-03-30 DIAGNOSIS — M8008XD Age-related osteoporosis with current pathological fracture, vertebra(e), subsequent encounter for fracture with routine healing: Secondary | ICD-10-CM | POA: Diagnosis not present

## 2017-03-30 DIAGNOSIS — R69 Illness, unspecified: Secondary | ICD-10-CM | POA: Diagnosis not present

## 2017-03-30 DIAGNOSIS — M48061 Spinal stenosis, lumbar region without neurogenic claudication: Secondary | ICD-10-CM | POA: Diagnosis not present

## 2017-03-30 DIAGNOSIS — R1310 Dysphagia, unspecified: Secondary | ICD-10-CM | POA: Diagnosis not present

## 2017-03-30 DIAGNOSIS — I110 Hypertensive heart disease with heart failure: Secondary | ICD-10-CM | POA: Diagnosis not present

## 2017-03-30 NOTE — Telephone Encounter (Signed)
Wife notified.

## 2017-03-31 ENCOUNTER — Encounter: Payer: Self-pay | Admitting: Nurse Practitioner

## 2017-03-31 ENCOUNTER — Ambulatory Visit (INDEPENDENT_AMBULATORY_CARE_PROVIDER_SITE_OTHER): Payer: Medicare HMO | Admitting: Pharmacist

## 2017-03-31 ENCOUNTER — Ambulatory Visit (INDEPENDENT_AMBULATORY_CARE_PROVIDER_SITE_OTHER): Payer: Medicare HMO | Admitting: Nurse Practitioner

## 2017-03-31 VITALS — BP 110/70 | HR 50

## 2017-03-31 DIAGNOSIS — Z7901 Long term (current) use of anticoagulants: Secondary | ICD-10-CM

## 2017-03-31 DIAGNOSIS — I259 Chronic ischemic heart disease, unspecified: Secondary | ICD-10-CM | POA: Diagnosis not present

## 2017-03-31 DIAGNOSIS — Z5181 Encounter for therapeutic drug level monitoring: Secondary | ICD-10-CM

## 2017-03-31 DIAGNOSIS — I480 Paroxysmal atrial fibrillation: Secondary | ICD-10-CM | POA: Diagnosis not present

## 2017-03-31 DIAGNOSIS — I4891 Unspecified atrial fibrillation: Secondary | ICD-10-CM | POA: Diagnosis not present

## 2017-03-31 LAB — POCT INR: INR: 4

## 2017-03-31 MED ORDER — APIXABAN 5 MG PO TABS: 5.0000 mg | ORAL_TABLET | Freq: Two times a day (BID) | ORAL | 0 refills | Status: DC

## 2017-03-31 MED ORDER — APIXABAN 5 MG PO TABS: 5.0000 mg | ORAL_TABLET | Freq: Two times a day (BID) | ORAL | 5 refills | Status: AC

## 2017-03-31 NOTE — Patient Instructions (Addendum)
We will be checking the following labs today - NONE   Medication Instructions:    Continue with your current medicines. BUT  I am stopping Lipitor  I am stopping aspirin  I am stopping Coumadin as of today  We will be starting Eliquis 5 mg to take twice a day - try to give samples - RX has been sent to the drug store    Testing/Procedures To Be Arranged:  N/A  Follow-Up:   See me in 6 months.    Other Special Instructions:   N/A    If you need a refill on your cardiac medications before your next appointment, please call your pharmacy.   Call the [place] office at [phone] if you have any questions, problems or concerns.
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
## 2017-04-01 DIAGNOSIS — S32020D Wedge compression fracture of second lumbar vertebra, subsequent encounter for fracture with routine healing: Secondary | ICD-10-CM | POA: Diagnosis not present

## 2017-04-01 DIAGNOSIS — R1310 Dysphagia, unspecified: Secondary | ICD-10-CM | POA: Diagnosis not present

## 2017-04-01 DIAGNOSIS — I509 Heart failure, unspecified: Secondary | ICD-10-CM | POA: Diagnosis not present

## 2017-04-01 DIAGNOSIS — I2510 Atherosclerotic heart disease of native coronary artery without angina pectoris: Secondary | ICD-10-CM | POA: Diagnosis not present

## 2017-04-01 DIAGNOSIS — I110 Hypertensive heart disease with heart failure: Secondary | ICD-10-CM | POA: Diagnosis not present

## 2017-04-01 DIAGNOSIS — Z5189 Encounter for other specified aftercare: Secondary | ICD-10-CM | POA: Diagnosis not present

## 2017-04-01 DIAGNOSIS — R69 Illness, unspecified: Secondary | ICD-10-CM | POA: Diagnosis not present

## 2017-04-01 DIAGNOSIS — M48061 Spinal stenosis, lumbar region without neurogenic claudication: Secondary | ICD-10-CM | POA: Diagnosis not present

## 2017-04-01 DIAGNOSIS — I480 Paroxysmal atrial fibrillation: Secondary | ICD-10-CM | POA: Diagnosis not present

## 2017-04-01 DIAGNOSIS — M8008XD Age-related osteoporosis with current pathological fracture, vertebra(e), subsequent encounter for fracture with routine healing: Secondary | ICD-10-CM | POA: Diagnosis not present

## 2017-04-02 DIAGNOSIS — I480 Paroxysmal atrial fibrillation: Secondary | ICD-10-CM | POA: Diagnosis not present

## 2017-04-02 DIAGNOSIS — M8008XD Age-related osteoporosis with current pathological fracture, vertebra(e), subsequent encounter for fracture with routine healing: Secondary | ICD-10-CM | POA: Diagnosis not present

## 2017-04-02 DIAGNOSIS — R1310 Dysphagia, unspecified: Secondary | ICD-10-CM | POA: Diagnosis not present

## 2017-04-02 DIAGNOSIS — I2510 Atherosclerotic heart disease of native coronary artery without angina pectoris: Secondary | ICD-10-CM | POA: Diagnosis not present

## 2017-04-02 DIAGNOSIS — R69 Illness, unspecified: Secondary | ICD-10-CM | POA: Diagnosis not present

## 2017-04-02 DIAGNOSIS — I110 Hypertensive heart disease with heart failure: Secondary | ICD-10-CM | POA: Diagnosis not present

## 2017-04-02 DIAGNOSIS — I509 Heart failure, unspecified: Secondary | ICD-10-CM | POA: Diagnosis not present

## 2017-04-02 DIAGNOSIS — M48061 Spinal stenosis, lumbar region without neurogenic claudication: Secondary | ICD-10-CM | POA: Diagnosis not present

## 2017-04-13 ENCOUNTER — Telehealth (HOSPITAL_COMMUNITY): Payer: Self-pay

## 2017-04-13 NOTE — Telephone Encounter (Signed)
Patients daughter is calling and she wants you to know that her father has been in and out of the hospital and had back surgery from a fall, that is why he has not been in to see you. She states he is currently bedridden and Dr. Alain Marion took him off of the Zoloft and put him on Symbax 6-25 mg 1 po qhs. Patients daughter is concerned about this as the patient has taken to non stop screaming all day and all night, she said anytime he can not see someone he starts screaming. Please review and advise, thank you

## 2017-04-16 ENCOUNTER — Other Ambulatory Visit: Payer: Self-pay | Admitting: Cardiology

## 2017-04-17 ENCOUNTER — Ambulatory Visit: Payer: Medicare HMO | Admitting: Internal Medicine

## 2017-04-17 NOTE — Telephone Encounter (Signed)
This message was reviewed by Dr. Casimiro Needle and he is going to call the patients wife.

## 2017-04-20 NOTE — Telephone Encounter (Signed)
Followed by gerhardt

## 2017-05-01 DIAGNOSIS — S32020D Wedge compression fracture of second lumbar vertebra, subsequent encounter for fracture with routine healing: Secondary | ICD-10-CM | POA: Diagnosis not present

## 2017-05-01 DIAGNOSIS — I2510 Atherosclerotic heart disease of native coronary artery without angina pectoris: Secondary | ICD-10-CM | POA: Diagnosis not present

## 2017-05-01 DIAGNOSIS — Z5189 Encounter for other specified aftercare: Secondary | ICD-10-CM | POA: Diagnosis not present

## 2017-05-01 DIAGNOSIS — R69 Illness, unspecified: Secondary | ICD-10-CM | POA: Diagnosis not present

## 2017-05-05 ENCOUNTER — Telehealth: Payer: Self-pay | Admitting: *Deleted

## 2017-05-05 ENCOUNTER — Other Ambulatory Visit: Payer: Self-pay | Admitting: *Deleted

## 2017-05-05 DIAGNOSIS — Z79899 Other long term (current) drug therapy: Secondary | ICD-10-CM

## 2017-05-05 NOTE — Telephone Encounter (Signed)
S/w pt's wife about her results. Stated pt has a pink tinge in depends X two days.  Pt is on Eliquis.  Pt will come in tomorrow for CBC per Cecille Rubin.  Orders in appointment made and linked.

## 2017-05-06 ENCOUNTER — Other Ambulatory Visit: Payer: Medicare HMO | Admitting: *Deleted

## 2017-05-06 DIAGNOSIS — Z79899 Other long term (current) drug therapy: Secondary | ICD-10-CM

## 2017-05-07 LAB — CBC WITH DIFFERENTIAL/PLATELET
Basophils Absolute: 0.1 10*3/uL (ref 0.0–0.2)
Basos: 1 %
EOS (ABSOLUTE): 0.4 10*3/uL (ref 0.0–0.4)
Eos: 4 %
Hematocrit: 39.4 % (ref 37.5–51.0)
Hemoglobin: 13.4 g/dL (ref 13.0–17.7)
Immature Grans (Abs): 0 10*3/uL (ref 0.0–0.1)
Immature Granulocytes: 0 %
Lymphocytes Absolute: 1.9 10*3/uL (ref 0.7–3.1)
Lymphs: 20 %
MCH: 34.7 pg — ABNORMAL HIGH (ref 26.6–33.0)
MCHC: 34 g/dL (ref 31.5–35.7)
MCV: 102 fL — ABNORMAL HIGH (ref 79–97)
Monocytes Absolute: 1.3 10*3/uL — ABNORMAL HIGH (ref 0.1–0.9)
Monocytes: 14 %
Neutrophils Absolute: 5.7 10*3/uL (ref 1.4–7.0)
Neutrophils: 61 %
Platelets: 237 10*3/uL (ref 150–379)
RBC: 3.86 x10E6/uL — ABNORMAL LOW (ref 4.14–5.80)
RDW: 13.7 % (ref 12.3–15.4)
WBC: 9.3 10*3/uL (ref 3.4–10.8)

## 2017-06-01 DIAGNOSIS — I2510 Atherosclerotic heart disease of native coronary artery without angina pectoris: Secondary | ICD-10-CM | POA: Diagnosis not present

## 2017-06-01 DIAGNOSIS — R69 Illness, unspecified: Secondary | ICD-10-CM | POA: Diagnosis not present

## 2017-06-01 DIAGNOSIS — S32020D Wedge compression fracture of second lumbar vertebra, subsequent encounter for fracture with routine healing: Secondary | ICD-10-CM | POA: Diagnosis not present

## 2017-06-01 DIAGNOSIS — Z5189 Encounter for other specified aftercare: Secondary | ICD-10-CM | POA: Diagnosis not present

## 2017-07-02 DIAGNOSIS — S32020D Wedge compression fracture of second lumbar vertebra, subsequent encounter for fracture with routine healing: Secondary | ICD-10-CM | POA: Diagnosis not present

## 2017-07-02 DIAGNOSIS — R69 Illness, unspecified: Secondary | ICD-10-CM | POA: Diagnosis not present

## 2017-07-02 DIAGNOSIS — I2510 Atherosclerotic heart disease of native coronary artery without angina pectoris: Secondary | ICD-10-CM | POA: Diagnosis not present

## 2017-07-02 DIAGNOSIS — Z5189 Encounter for other specified aftercare: Secondary | ICD-10-CM | POA: Diagnosis not present

## 2017-07-27 ENCOUNTER — Telehealth: Payer: Self-pay | Admitting: Internal Medicine

## 2017-07-27 MED ORDER — ONDANSETRON HCL 4 MG PO TABS: 4.0000 mg | ORAL_TABLET | Freq: Two times a day (BID) | ORAL | 1 refills | Status: DC | PRN

## 2017-07-27 MED ORDER — MECLIZINE HCL 12.5 MG PO TABS: 12.5000 mg | ORAL_TABLET | Freq: Three times a day (TID) | ORAL | 1 refills | Status: AC | PRN

## 2017-07-27 NOTE — Telephone Encounter (Signed)
Pls advise on msg below.../lmb 

## 2017-07-27 NOTE — Telephone Encounter (Addendum)
Pt wife called stating her husband the patient has dementia and is bed ridden. When the patient is moved or turned in bed the patient becomes nauseous. He has in the last 2-3 weeks vomited up his supper twice after being moved. The patients wife would like to know if something can be called in for nausea and motion sickness.  Please advise  POF

## 2017-07-27 NOTE — Telephone Encounter (Signed)
Use Antivert prn Zofran prn Rx emailed Thx

## 2017-07-28 ENCOUNTER — Ambulatory Visit (INDEPENDENT_AMBULATORY_CARE_PROVIDER_SITE_OTHER)
Admission: RE | Admit: 2017-07-28 | Discharge: 2017-07-28 | Disposition: A | Discharge status: Home / Self Care | Payer: Medicare HMO | Source: Ambulatory Visit | Attending: Family Medicine | Admitting: Family Medicine

## 2017-07-28 ENCOUNTER — Ambulatory Visit (INDEPENDENT_AMBULATORY_CARE_PROVIDER_SITE_OTHER): Payer: Medicare HMO | Admitting: Family Medicine

## 2017-07-28 ENCOUNTER — Other Ambulatory Visit: Payer: Self-pay | Admitting: Family Medicine

## 2017-07-28 ENCOUNTER — Other Ambulatory Visit (INDEPENDENT_AMBULATORY_CARE_PROVIDER_SITE_OTHER): Payer: Medicare HMO

## 2017-07-28 ENCOUNTER — Encounter: Payer: Self-pay | Admitting: Family Medicine

## 2017-07-28 VITALS — BP 122/80 | HR 73 | Temp 97.5°F

## 2017-07-28 DIAGNOSIS — R05 Cough: Secondary | ICD-10-CM

## 2017-07-28 DIAGNOSIS — R059 Cough, unspecified: Secondary | ICD-10-CM

## 2017-07-28 DIAGNOSIS — S32020D Wedge compression fracture of second lumbar vertebra, subsequent encounter for fracture with routine healing: Secondary | ICD-10-CM | POA: Diagnosis not present

## 2017-07-28 DIAGNOSIS — G4700 Insomnia, unspecified: Secondary | ICD-10-CM | POA: Diagnosis not present

## 2017-07-28 DIAGNOSIS — R42 Dizziness and giddiness: Secondary | ICD-10-CM

## 2017-07-28 LAB — BASIC METABOLIC PANEL
BUN: 16 mg/dL (ref 6–23)
CALCIUM: 10.7 mg/dL — AB (ref 8.4–10.5)
CO2: 31 meq/L (ref 19–32)
Chloride: 95 mEq/L — ABNORMAL LOW (ref 96–112)
Creatinine, Ser: 0.89 mg/dL (ref 0.40–1.50)
GFR: 86.16 mL/min (ref >60.00)
GLUCOSE: 125 mg/dL — AB (ref 70–99)
Potassium: 3.4 mEq/L — ABNORMAL LOW (ref 3.5–5.1)
Sodium: 134 mEq/L — ABNORMAL LOW (ref 135–145)

## 2017-07-28 LAB — CBC WITH DIFFERENTIAL/PLATELET
BASOS PCT: 0.4 % (ref 0.0–3.0)
Basophils Absolute: 0 10*3/uL (ref 0.0–0.1)
Eosinophils Absolute: 0.2 10*3/uL (ref 0.0–0.7)
Eosinophils Relative: 2.6 % (ref 0.0–5.0)
HEMATOCRIT: 41.5 % (ref 39.0–52.0)
HEMOGLOBIN: 14.4 g/dL (ref 13.0–17.0)
LYMPHS PCT: 14.3 % (ref 12.0–46.0)
Lymphs Abs: 1.3 10*3/uL (ref 0.7–4.0)
MCHC: 34.7 g/dL (ref 30.0–36.0)
MCV: 104.9 fl — ABNORMAL HIGH (ref 78.0–100.0)
MONOS PCT: 13.2 % — AB (ref 3.0–12.0)
Monocytes Absolute: 1.2 10*3/uL — ABNORMAL HIGH (ref 0.1–1.0)
Neutro Abs: 6.4 10*3/uL (ref 1.4–7.7)
Neutrophils Relative %: 69.5 % (ref 43.0–77.0)
Platelets: 193 10*3/uL (ref 150.0–400.0)
RBC: 3.96 Mil/uL — AB (ref 4.22–5.81)
RDW: 13.4 % (ref 11.5–15.5)
WBC: 9.2 10*3/uL (ref 4.0–10.5)

## 2017-07-28 MED ORDER — ONDANSETRON HCL 4 MG PO TABS: 4.0000 mg | ORAL_TABLET | Freq: Two times a day (BID) | ORAL | 1 refills | Status: AC | PRN

## 2017-07-28 NOTE — Assessment & Plan Note (Signed)
His symptoms with vomiting appear to be associated with vertigo. - provided instructions on how to perform the maneuver. His wife at the physical therapist that her coming to the [facility] help with this. If unable to do this may need to refer to vestibular ocular area.

## 2017-07-28 NOTE — Assessment & Plan Note (Signed)
Concerned that he may have pneumonia versus just atelectasis based on being bed bound for most days. - Chest x-ray - Lab work

## 2017-07-28 NOTE — Assessment & Plan Note (Signed)
If still having pain and symptomatic could consider a CT myelogram and referral to neurosurgery

## 2017-07-28 NOTE — Telephone Encounter (Signed)
Notified pt/wife w/MD response../lmb 

## 2017-07-28 NOTE — Progress Notes (Signed)
Samuel Santiago RSWNIOE - 83 y.o. male MRN [id number]  Date of birth: 1929/03/30  SUBJECTIVE:  Including CC & ROS.  No chief complaint on file.   Samuel Santiago is an 83 year old male that is presenting with cough and vomiting. He has a history of compression fracture from February and subsequent surgery that has yielded him bedbound. He has been in his bed and when they rotate them over he ends up becoming nauseous and having emesis. The emesis is food contents. He also has some coughing. Has not had any fevers. His wife has physical therapist come to the house to help him with his strengthening and rehabilitation from his surgery. He has a hard time falling asleep at night and some days he does not sleep at all. His wife reports the medications do not always seem to help. When he turned over in bed he feels like the room is spinning. He is not able to ambulate and has to be pushed around with a wheelchair.     Review of Systems  Constitutional: Negative for fever.  Respiratory: Positive for cough. Negative for shortness of breath.   Cardiovascular: Negative for chest pain.  Gastrointestinal: Positive for vomiting.  Musculoskeletal: Positive for back pain and gait problem.  Skin: Negative for color change.  Neurological: Positive for dizziness.    HISTORY: Past Medical, Surgical, Social, and Family History Reviewed & Updated per EMR.   Pertinent Historical Findings include:  Past Medical History:  Diagnosis Date  . Arthritis   . CHF (congestive heart failure) ([place])    EF 50-55% 2016  . Chronic anticoagulation   . Colon cancer ([place]) 1990   Had recurrence in 2003 resultinbg in chemotherapy. He recently had a colonoscopy and  had a polyp removed that was benign.  . Coronary artery disease   . Dementia   . Depression   . Dysrhythmia    Atrial fib  . GERD (gastroesophageal reflux disease)   . History of colon polyps   . History of kidney stones   . History of nephrolithiasis 01/23/2012  .  Hyperlipidemia   . Hypertension   . Ischemic heart disease    has had remote MI in 1997 and was treated with TPA. Prior PCI to RCA with repeat PCI to the RCA in 2003  . Kidney stone    x11  . Myocardial infarction ([place])    x2  . Normal nuclear stress test Feb 2012   No significant ischemia. EF 56%; with basal inferior scar  . PAF (paroxysmal atrial fibrillation) ([place])     Past Surgical History:  Procedure Laterality Date  . COLON SURGERY     x 2 for cancer  . COLONOSCOPY     colon cancer  . [place] & 2003   PCI to RCA x 2  . EYE SURGERY    . KYPHOPLASTY N/A 01/22/2017   Procedure: KYPHOPLASTY L2;  Surgeon: Melina Schools, MD;  Location: [facility];  Service: Orthopedics;  Laterality: N/A;    Allergies  Allergen Reactions  . Tape Other (See Comments)    Pulled off skin "it really sticks to me" >  will probably tolerate paper tape     Family History  Problem Relation Age of Onset  . Arthritis Sister   . Breast cancer Sister   . Congestive Heart Failure Mother   . Stroke Mother   . Heart disease Father   . Colon polyps Son   . Colon polyps Daughter   .  Heart attack Brother      Social History   Social History  . Marital status: Married    Spouse name: N/A  . Number of children: 3  . Years of education: N/A   Occupational History  . retired    Social History Main Topics  . Smoking status: Former Smoker    Quit date: 10/20/1985  . Smokeless tobacco: Never Used  . Alcohol use No  . Drug use: No  . Sexual activity: Not on file   Other Topics Concern  . Not on file   Social History Narrative  . No narrative on file     PHYSICAL EXAM:  VS: BP 122/80 (BP Location: Right Arm, Patient Position: Sitting, Cuff Size: Normal)   Pulse 73   Temp (!) 97.5 F (36.4 C)   SpO2 90%  Physical Exam Gen: NAD, alert, cooperative with exam, ENT: normal lips, normal nasal mucosa,  Eye: normal EOM, normal conjunctiva and lids CV:  no edema, +2 pedal  pulses, S1-S2   Resp: no accessory muscle use, non-labored, clear to auscultation bilaterally Skin: no rashes, no areas of induration  Neuro: normal tone, normal sensation to touch Psych:  alert and oriented MSK: Wheelchair bound, weakness in the lower extremities      ASSESSMENT & PLAN:   Cough Concerned that he may have pneumonia versus just atelectasis based on being bed bound for most days. - Chest x-ray - Lab work  Vertigo His symptoms with vomiting appear to be associated with vertigo. - provided instructions on how to perform the maneuver. His wife at the physical therapist that her coming to the [facility] help with this. If unable to do this may need to refer to vestibular ocular area.  Compression fracture of L2 lumbar vertebra (H[facility]) If still having pain and symptomatic could consider a CT myelogram and referral to neurosurgery  Insomnia Has trouble getting to sleep his wife reports limited improvement with the olanzapine and fluoxetine combination. Could consider trazodone or referral to geriatrician.

## 2017-07-28 NOTE — Patient Instructions (Addendum)
Thank you for coming in,   Please try Dramamine. I would hold off the meclizine if you can. I have sent in zofran.   We will call you with results from today.   Please feel free to call with any questions or concerns at any time, at [phone]. --Dr. Raeford Razor  How to Perform the Epley Maneuver The Epley maneuver is an exercise that relieves symptoms of vertigo. Vertigo is the feeling that you or your surroundings are moving when they are not. When you feel vertigo, you may feel like the room is spinning and have trouble walking. Dizziness is a little different than vertigo. When you are dizzy, you may feel unsteady or light-headed. You can do this maneuver at home whenever you have symptoms of vertigo. You can do it up to 3 times a day until your symptoms go away. Even though the Epley maneuver may relieve your vertigo for a few weeks, it is possible that your symptoms will return. This maneuver relieves vertigo, but it does not relieve dizziness. What are the risks? If it is done correctly, the Epley maneuver is considered safe. Sometimes it can lead to dizziness or nausea that goes away after a short time. If you develop other symptoms, such as changes in vision, weakness, or numbness, stop doing the maneuver and call your health care provider. How to perform the Epley maneuver 1. Sit on the edge of a bed or table with your back straight and your legs extended or hanging over the edge of the bed or table. 2. Turn your head halfway toward the affected ear or side. 3. Lie backward quickly with your head turned until you are lying flat on your back. You may want to position a pillow under your shoulders. 4. Hold this position for 30 seconds. You may experience an attack of vertigo. This is normal. 5. Turn your head to the opposite direction until your unaffected ear is facing the floor. 6. Hold this position for 30 seconds. You may experience an attack of vertigo. This is normal. Hold this  position until the vertigo stops. 7. Turn your whole body to the same side as your head. Hold for another 30 seconds. 8. Sit back up. You can repeat this exercise up to 3 times a day. Follow these instructions at home:  After doing the Epley maneuver, you can return to your normal activities.  Ask your health care provider if there is anything you should do at home to prevent vertigo. He or she may recommend that you: ? Keep your head raised (elevated) with two or more pillows while you sleep. ? Do not sleep on the side of your affected ear. ? Get up slowly from bed. ? Avoid sudden movements during the day. ? Avoid extreme head movement, like looking up or bending over. Contact a health care provider if:  Your vertigo gets worse.  You have other symptoms, including: ? Nausea. ? Vomiting. ? Headache. Get help right away if:  You have vision changes.  You have a severe or worsening headache or neck pain.  You cannot stop vomiting.  You have new numbness or weakness in any part of your body. Summary  Vertigo is the feeling that you or your surroundings are moving when they are not.  The Epley maneuver is an exercise that relieves symptoms of vertigo.  If the Epley maneuver is done correctly, it is considered safe. You can do it up to 3 times a day. This information is  not intended to replace advice given to you by your health care provider. Make sure you discuss any questions you have with your health care provider. Document Released: 10/11/2013 Document Revised: 08/26/2016 Document Reviewed: 08/26/2016 Elsevier Interactive Patient Education  2017 [employer].

## 2017-07-28 NOTE — Assessment & Plan Note (Signed)
Has trouble getting to sleep his wife reports limited improvement with the olanzapine and fluoxetine combination. Could consider trazodone or referral to geriatrician.

## 2017-08-01 DIAGNOSIS — S32020D Wedge compression fracture of second lumbar vertebra, subsequent encounter for fracture with routine healing: Secondary | ICD-10-CM | POA: Diagnosis not present

## 2017-08-01 DIAGNOSIS — R69 Illness, unspecified: Secondary | ICD-10-CM | POA: Diagnosis not present

## 2017-08-01 DIAGNOSIS — I2510 Atherosclerotic heart disease of native coronary artery without angina pectoris: Secondary | ICD-10-CM | POA: Diagnosis not present

## 2017-08-01 DIAGNOSIS — Z5189 Encounter for other specified aftercare: Secondary | ICD-10-CM | POA: Diagnosis not present

## 2017-08-03 ENCOUNTER — Other Ambulatory Visit: Payer: Self-pay | Admitting: Internal Medicine

## 2017-08-11 DIAGNOSIS — R69 Illness, unspecified: Secondary | ICD-10-CM | POA: Diagnosis not present

## 2017-08-25 ENCOUNTER — Other Ambulatory Visit (HOSPITAL_COMMUNITY): Payer: Self-pay | Admitting: Psychiatry

## 2017-08-28 ENCOUNTER — Other Ambulatory Visit (HOSPITAL_COMMUNITY): Payer: Self-pay

## 2017-08-28 MED ORDER — DIVALPROEX SODIUM ER 250 MG PO TB24: ORAL_TABLET | ORAL | 0 refills | Status: AC

## 2017-09-01 DIAGNOSIS — Z5189 Encounter for other specified aftercare: Secondary | ICD-10-CM | POA: Diagnosis not present

## 2017-09-01 DIAGNOSIS — S32020D Wedge compression fracture of second lumbar vertebra, subsequent encounter for fracture with routine healing: Secondary | ICD-10-CM | POA: Diagnosis not present

## 2017-09-01 DIAGNOSIS — R69 Illness, unspecified: Secondary | ICD-10-CM | POA: Diagnosis not present

## 2017-09-01 DIAGNOSIS — I2510 Atherosclerotic heart disease of native coronary artery without angina pectoris: Secondary | ICD-10-CM | POA: Diagnosis not present

## 2017-09-08 ENCOUNTER — Telehealth: Payer: Self-pay | Admitting: Internal Medicine

## 2017-09-08 DIAGNOSIS — G301 Alzheimer's disease with late onset: Principal | ICD-10-CM

## 2017-09-08 DIAGNOSIS — F0281 Dementia in other diseases classified elsewhere with behavioral disturbance: Secondary | ICD-10-CM

## 2017-09-08 NOTE — Telephone Encounter (Signed)
Pt wife called stating the patient is completely bed ridden and she would like orders for hospice care,  Please advise and call back if any problems

## 2017-09-09 NOTE — Telephone Encounter (Signed)
I'm sorry! I'll make a referral Thx

## 2017-09-14 NOTE — Telephone Encounter (Signed)
Hospice called.  

## 2017-09-18 ENCOUNTER — Telehealth: Payer: Self-pay | Admitting: *Deleted

## 2017-09-18 NOTE — Telephone Encounter (Signed)
Noted  

## 2017-09-18 NOTE — Telephone Encounter (Signed)
Pt's wife called and asked to s/w Andee Poles, CMA for Truitt Merle, NP. I advised pt's wife Andee Poles is out of the office. She stated to me that she needed to cancel 09/29/17 appt with Truitt Merle, NP. She wants Cecille Rubin to know that pt is now with Hospice and the doctor with Hospice will be taking care of him. Pt's wife states it will be a lot easier since pt is now having trouble walking. I advised pt's wife I will cancel the appt and let Truitt Merle, NP know of our conversation today. Pt's wife thanked me for my help today.

## 2017-09-26 ENCOUNTER — Other Ambulatory Visit: Payer: Self-pay | Admitting: Internal Medicine

## 2017-09-29 ENCOUNTER — Ambulatory Visit: Payer: Self-pay | Admitting: Nurse Practitioner

## 2017-10-06 ENCOUNTER — Telehealth: Payer: Self-pay | Admitting: Internal Medicine

## 2017-10-06 NOTE — Telephone Encounter (Signed)
Samuel Santiago, [employer]chant [employer] officer from [facility] calling asking to get an updated medication list and allergies from the pt. Curtis the pharmacist also wanted to know the  Pt's most recent BP.Information given, no other concerns voiced at this time.

## 2017-10-27 ENCOUNTER — Telehealth: Payer: Self-pay

## 2017-10-27 NOTE — Telephone Encounter (Signed)
On 10/27/17 I received a d/c from [employer] (original). The d/c is for cremation. The patient is a patient of Doctor Plotnikov. The d/c will be taken to [facility] this pm for signature.  On 10/29/17 I received the d/c back from Doctor Plotnikov. I got the d/c ready and called the funeral home to let them know the d/c is ready for pickup. I also faxed a copy to the funeral home per the funeral home request.

## 2017-11-18 IMAGING — CR DG LUMBAR SPINE COMPLETE 4+V
6 series · 6 of 6 positions shown · non-contrast
Comparison: CT thoracolumbar spine 11/25/2016 ; correlation CTA
chest 05/09/2016

CLINICAL DATA: Worsening moderate to severe mid to low back pain
over last few days, history compression fracture

EXAM:
LUMBAR SPINE - COMPLETE 4+ VIEW

[t lumbar spine ap]
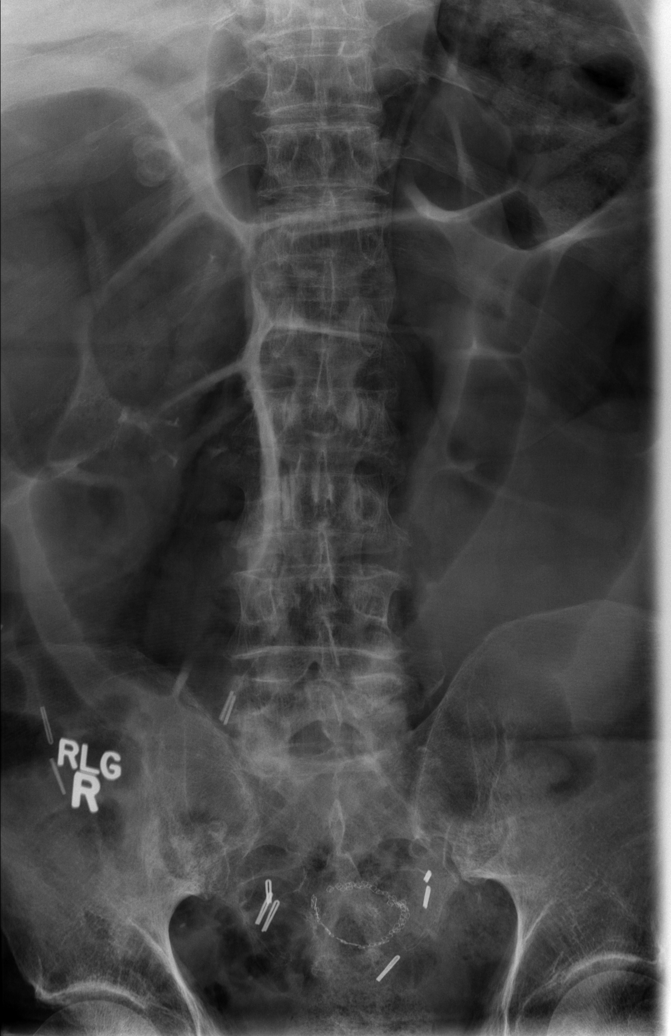

[t lumbar spine obl (1 of 2)]
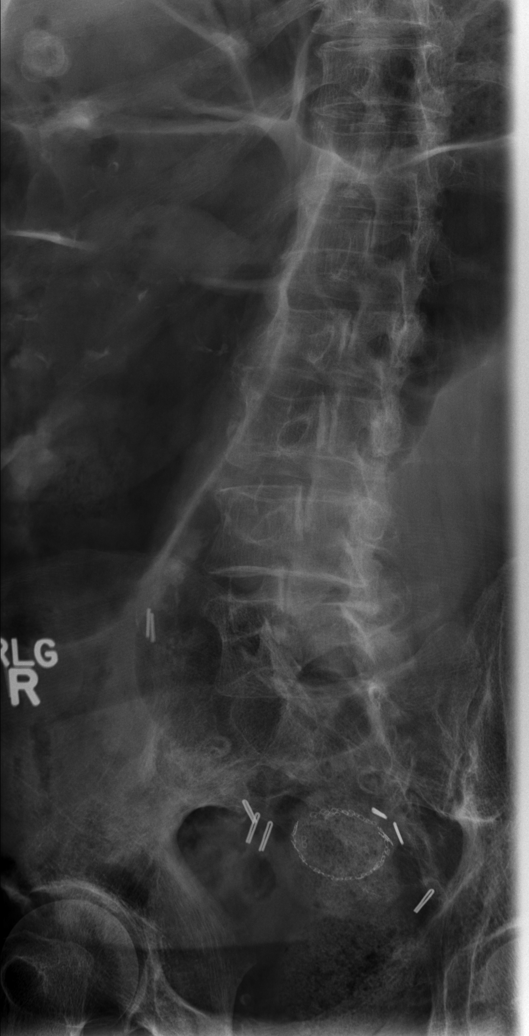

[t lumbar spine obl (2 of 2)]
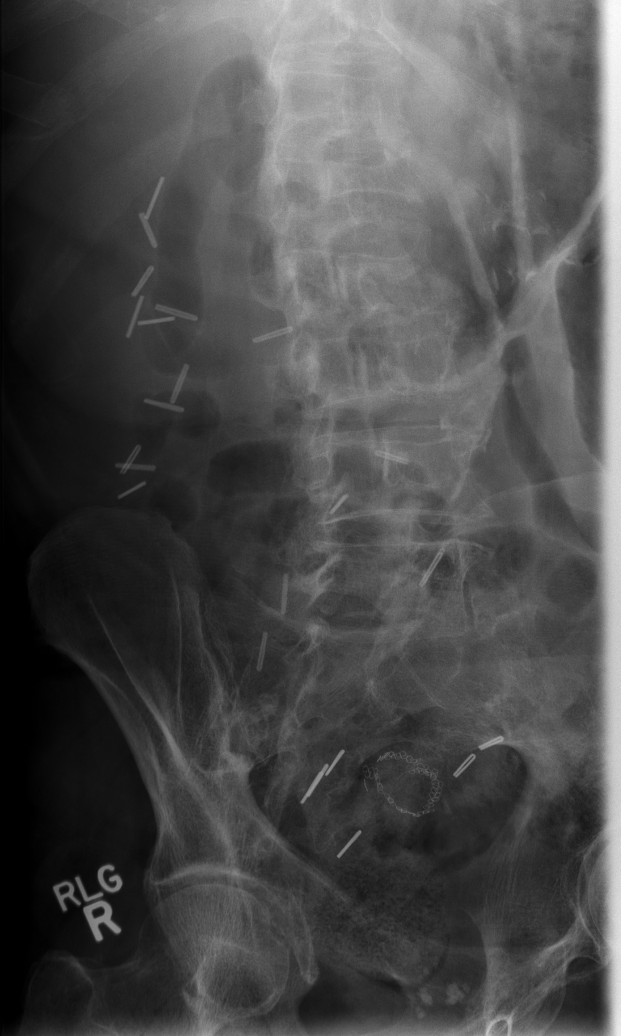

[t lumbar spine lat (1 of 2)]
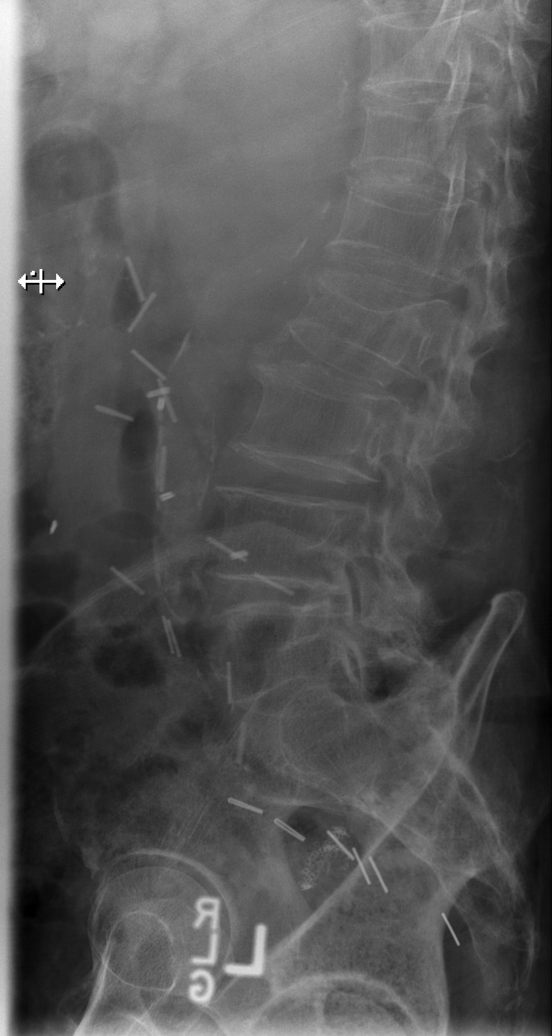

[t lumbar spine lat (2 of 2)]
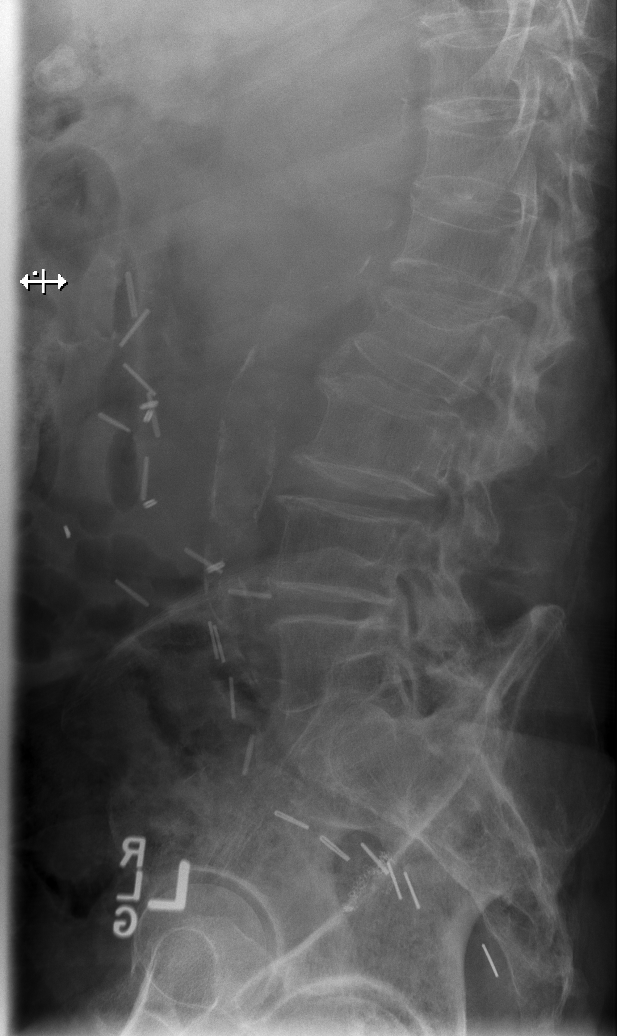

[t lumbar l-5 s-1 spot]
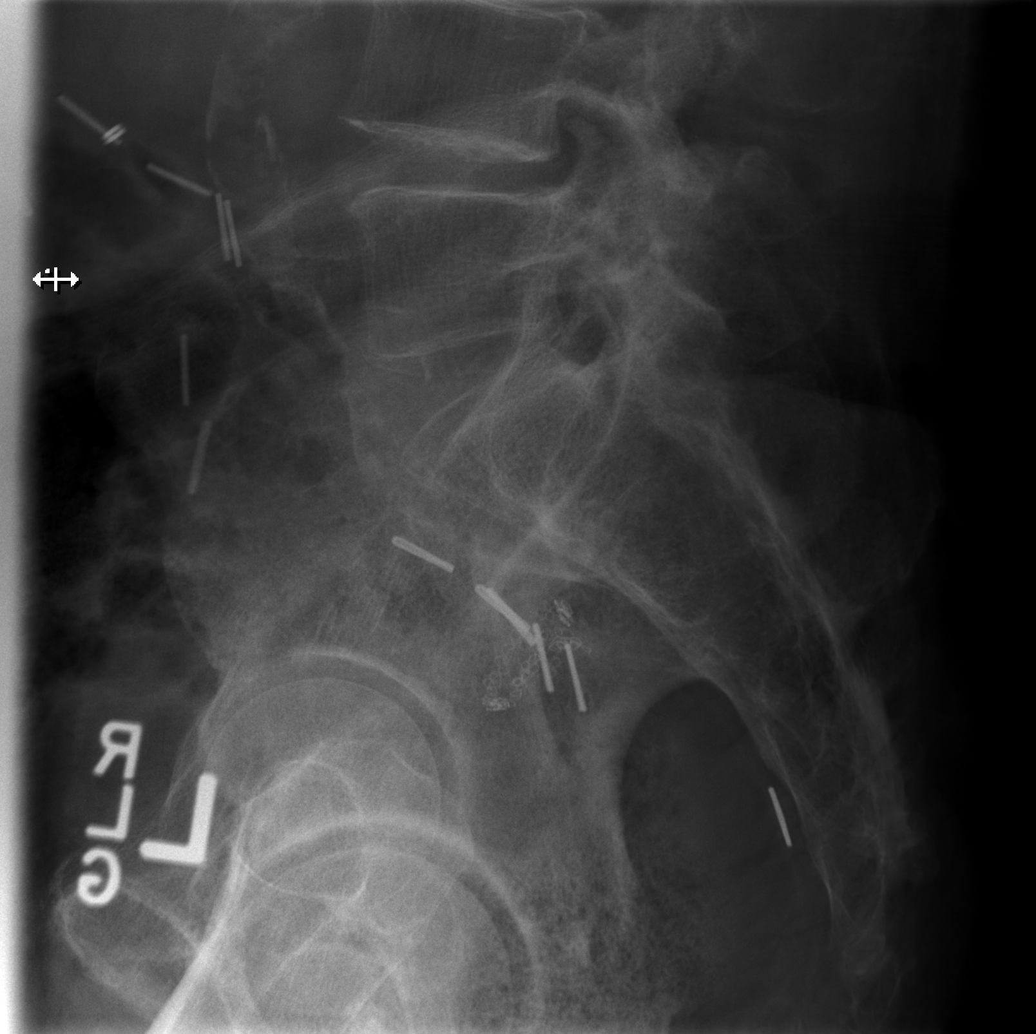

[6 of 6 positions shown; findings below may reference images not displayed]

FINDINGS: Marked osseous demineralization.

Five non-rib-bearing lumbar vertebra.

Again identified mild superior endplate compression fracture of L2
vertebral body with anterior height loss, minimally increased versus
previous study.

No additional fracture, subluxation or bone destruction.

Atherosclerotic calcification aorta.

Numerous surgical clips in abdomen.

Gaseous distention of colon.

Calcified gallstone 2 cm diameter in RIGHT upper quadrant, also
noted on a prior CT.
IMPRESSION: Marked osseous demineralization with slightly increased L2
compression fracture versus prior exam.

Cholelithiasis.

## 2017-11-20 DEATH — deceased
